# Patient Record
Sex: Female | Born: 1935 | Race: White | Hispanic: No | Marital: Married | Smoking: Former smoker
Health system: Southern US, Community
[De-identification: ages and names within clinical notes are randomized; demographics above are authoritative.]

## PROBLEM LIST (undated history)

## (undated) DIAGNOSIS — I10 Essential (primary) hypertension: Secondary | ICD-10-CM

## (undated) DIAGNOSIS — I771 Stricture of artery: Secondary | ICD-10-CM

## (undated) DIAGNOSIS — G4730 Sleep apnea, unspecified: Secondary | ICD-10-CM

## (undated) DIAGNOSIS — N059 Unspecified nephritic syndrome with unspecified morphologic changes: Secondary | ICD-10-CM

## (undated) DIAGNOSIS — I639 Cerebral infarction, unspecified: Secondary | ICD-10-CM

## (undated) DIAGNOSIS — K219 Gastro-esophageal reflux disease without esophagitis: Secondary | ICD-10-CM

## (undated) DIAGNOSIS — E7800 Pure hypercholesterolemia, unspecified: Secondary | ICD-10-CM

## (undated) DIAGNOSIS — F419 Anxiety disorder, unspecified: Secondary | ICD-10-CM

## (undated) DIAGNOSIS — C801 Malignant (primary) neoplasm, unspecified: Secondary | ICD-10-CM

## (undated) DIAGNOSIS — I701 Atherosclerosis of renal artery: Secondary | ICD-10-CM

## (undated) DIAGNOSIS — I776 Arteritis, unspecified: Secondary | ICD-10-CM

## (undated) DIAGNOSIS — I7782 Antineutrophilic cytoplasmic antibody (ANCA) vasculitis: Secondary | ICD-10-CM

## (undated) HISTORY — DX: Stricture of artery: I77.1

## (undated) HISTORY — DX: Essential (primary) hypertension: I10

## (undated) HISTORY — DX: Pure hypercholesterolemia, unspecified: E78.00

## (undated) HISTORY — DX: Cerebral infarction, unspecified: I63.9

## (undated) HISTORY — PX: MASTECTOMY: SHX3

## (undated) HISTORY — PX: BIOPSY THYROID: PRO38

## (undated) HISTORY — DX: Atherosclerosis of renal artery: I70.1

---

## 1993-09-15 HISTORY — PX: OTHER SURGICAL HISTORY: SHX169

## 2001-03-16 ENCOUNTER — Other Ambulatory Visit: Admission: RE | Admit: 2001-03-16 | Discharge: 2001-03-16 | Payer: Self-pay | Admitting: Family Medicine

## 2001-05-28 ENCOUNTER — Encounter: Admission: RE | Admit: 2001-05-28 | Discharge: 2001-05-28 | Payer: Self-pay | Admitting: Oncology

## 2001-05-28 ENCOUNTER — Encounter (HOSPITAL_COMMUNITY): Admission: RE | Admit: 2001-05-28 | Discharge: 2001-06-27 | Payer: Self-pay | Admitting: Oncology

## 2001-06-29 ENCOUNTER — Ambulatory Visit (HOSPITAL_COMMUNITY): Admission: RE | Admit: 2001-06-29 | Discharge: 2001-06-29 | Payer: Self-pay | Admitting: Family Medicine

## 2001-06-29 ENCOUNTER — Encounter: Payer: Self-pay | Admitting: Family Medicine

## 2002-05-30 ENCOUNTER — Encounter: Admission: RE | Admit: 2002-05-30 | Discharge: 2002-05-30 | Payer: Self-pay | Admitting: Oncology

## 2002-05-30 ENCOUNTER — Encounter (HOSPITAL_COMMUNITY): Admission: RE | Admit: 2002-05-30 | Discharge: 2002-06-29 | Payer: Self-pay | Admitting: Oncology

## 2002-07-04 ENCOUNTER — Encounter: Admission: RE | Admit: 2002-07-04 | Discharge: 2002-07-04 | Payer: Self-pay | Admitting: Oncology

## 2002-07-04 ENCOUNTER — Encounter (HOSPITAL_COMMUNITY): Admission: RE | Admit: 2002-07-04 | Discharge: 2002-08-03 | Payer: Self-pay | Admitting: Oncology

## 2002-07-04 ENCOUNTER — Encounter (HOSPITAL_COMMUNITY): Payer: Self-pay | Admitting: Oncology

## 2003-05-05 ENCOUNTER — Encounter: Payer: Self-pay | Admitting: Cardiovascular Disease

## 2003-06-20 ENCOUNTER — Encounter (HOSPITAL_COMMUNITY): Admission: RE | Admit: 2003-06-20 | Discharge: 2003-07-20 | Payer: Self-pay | Admitting: Oncology

## 2003-06-20 ENCOUNTER — Encounter: Admission: RE | Admit: 2003-06-20 | Discharge: 2003-06-20 | Payer: Self-pay | Admitting: Oncology

## 2003-07-10 ENCOUNTER — Encounter: Payer: Self-pay | Admitting: Family Medicine

## 2003-07-10 ENCOUNTER — Ambulatory Visit (HOSPITAL_COMMUNITY): Admission: RE | Admit: 2003-07-10 | Discharge: 2003-07-10 | Payer: Self-pay | Admitting: Family Medicine

## 2003-07-17 ENCOUNTER — Ambulatory Visit (HOSPITAL_COMMUNITY): Admission: RE | Admit: 2003-07-17 | Discharge: 2003-07-17 | Payer: Self-pay | Admitting: Internal Medicine

## 2004-07-12 ENCOUNTER — Ambulatory Visit (HOSPITAL_COMMUNITY): Admission: RE | Admit: 2004-07-12 | Discharge: 2004-07-12 | Payer: Self-pay | Admitting: Family Medicine

## 2004-11-26 ENCOUNTER — Encounter: Payer: Self-pay | Admitting: Cardiovascular Disease

## 2005-01-30 ENCOUNTER — Ambulatory Visit (HOSPITAL_COMMUNITY): Admission: RE | Admit: 2005-01-30 | Discharge: 2005-01-30 | Payer: Self-pay | Admitting: Family Medicine

## 2005-05-29 ENCOUNTER — Ambulatory Visit (HOSPITAL_COMMUNITY): Admission: RE | Admit: 2005-05-29 | Discharge: 2005-05-29 | Payer: Self-pay | Admitting: Family Medicine

## 2005-07-15 ENCOUNTER — Ambulatory Visit (HOSPITAL_COMMUNITY): Admission: RE | Admit: 2005-07-15 | Discharge: 2005-07-15 | Payer: Self-pay | Admitting: Family Medicine

## 2006-07-22 ENCOUNTER — Ambulatory Visit (HOSPITAL_COMMUNITY): Admission: RE | Admit: 2006-07-22 | Discharge: 2006-07-22 | Payer: Self-pay | Admitting: Family Medicine

## 2006-12-29 ENCOUNTER — Encounter: Payer: Self-pay | Admitting: Cardiovascular Disease

## 2007-07-29 ENCOUNTER — Ambulatory Visit (HOSPITAL_COMMUNITY): Admission: RE | Admit: 2007-07-29 | Discharge: 2007-07-29 | Payer: Self-pay | Admitting: Family Medicine

## 2008-06-20 ENCOUNTER — Encounter: Payer: Self-pay | Admitting: Cardiovascular Disease

## 2008-08-01 ENCOUNTER — Ambulatory Visit (HOSPITAL_COMMUNITY): Admission: RE | Admit: 2008-08-01 | Discharge: 2008-08-01 | Payer: Self-pay | Admitting: Family Medicine

## 2009-05-29 ENCOUNTER — Telehealth (INDEPENDENT_AMBULATORY_CARE_PROVIDER_SITE_OTHER): Payer: Self-pay | Admitting: *Deleted

## 2009-07-12 ENCOUNTER — Encounter (INDEPENDENT_AMBULATORY_CARE_PROVIDER_SITE_OTHER): Payer: Self-pay | Admitting: *Deleted

## 2009-07-12 LAB — CONVERTED CEMR LAB
Calcium: 10.6 mg/dL
Chloride: 104 meq/L
Glucose, Bld: 131 mg/dL
Hgb A1c MFr Bld: 6.8 %
Potassium: 5 meq/L
Sodium: 144 meq/L
Triglycerides: 102 mg/dL

## 2009-08-03 ENCOUNTER — Ambulatory Visit (HOSPITAL_COMMUNITY): Admission: RE | Admit: 2009-08-03 | Discharge: 2009-08-03 | Payer: Self-pay | Admitting: Family Medicine

## 2009-08-19 DIAGNOSIS — E782 Mixed hyperlipidemia: Secondary | ICD-10-CM

## 2009-08-19 DIAGNOSIS — I10 Essential (primary) hypertension: Secondary | ICD-10-CM | POA: Insufficient documentation

## 2009-08-19 DIAGNOSIS — R0989 Other specified symptoms and signs involving the circulatory and respiratory systems: Secondary | ICD-10-CM

## 2009-08-20 ENCOUNTER — Encounter (INDEPENDENT_AMBULATORY_CARE_PROVIDER_SITE_OTHER): Payer: Self-pay | Admitting: *Deleted

## 2009-08-20 ENCOUNTER — Ambulatory Visit: Payer: Self-pay | Admitting: Cardiovascular Disease

## 2009-08-20 ENCOUNTER — Encounter: Payer: Self-pay | Admitting: Cardiology

## 2009-09-10 ENCOUNTER — Ambulatory Visit (HOSPITAL_COMMUNITY): Admission: RE | Admit: 2009-09-10 | Discharge: 2009-09-10 | Payer: Self-pay | Admitting: Cardiovascular Disease

## 2009-09-12 ENCOUNTER — Encounter: Payer: Self-pay | Admitting: Cardiovascular Disease

## 2010-03-04 ENCOUNTER — Ambulatory Visit: Payer: Self-pay | Admitting: Cardiovascular Disease

## 2010-08-05 ENCOUNTER — Ambulatory Visit (HOSPITAL_COMMUNITY): Admission: RE | Admit: 2010-08-05 | Discharge: 2010-08-05 | Payer: Self-pay | Admitting: Family Medicine

## 2010-10-15 NOTE — Assessment & Plan Note (Signed)
Summary: 6 mth f/u per checkout on 08/20/09/tg   Visit Type:  Follow-up Primary Provider:  Dr.Stephen Karie Kirks  CC:  no cardiology complaints.  History of Present Illness: Yuleimi is seen today for F/U of elevated lipids, HTN.  She has previously been seen by Helayne Seminole from [facility].  There was a question of carotid disease but duplex done 12/10 showed no significatn disease.  CRF's include elevated lipid and HTN which is finally Rx.  She also had a HbA1c of 7.3 and has begun Rx for type 2 DM.  She is active in the garden and bowls  She is not having any sscp, palpitations, PND, orthopnea or edema.  She had a normal myouve in 2008 with no documented CAD  Her LDL is 130.  She was supposed to be on 80mg  of Zocor.  She has only been taking a half.  With the new FDA warning on Zocor and her wish to stary on generic we will call he rin 80mg  of Pravachol.    Current Problems (verified): 1)  Mixed Hyperlipidemia  (ICD-272.2) 2)  Carotid Bruit  (ICD-785.9) 3)  Essential Hypertension, Benign  (ICD-401.1)  Current Medications (verified): 1)  Caltrate 600+d 600-400 Mg-Unit Tabs (Calcium Carbonate-Vitamin D) .... Take 1 Tab Two Times A Day 2)  Aspir-Low 81 Mg Tbec (Aspirin) .... Take 1 Tab Daily 3)  Cosamin Ds 500-400 Mg Caps (Glucosamine-Chondroitin) .... Take 1 Tab Daily 4)  Gemfibrozil 600 Mg Tabs (Gemfibrozil) .... Take 1 Tab Bid 5)  Zantac 150 Mg Tabs (Ranitidine Hcl) .... Take Prn 6)  Daily Vitamins  Tabs (Multiple Vitamin) .... Take 1 Tab Daily 7)  Lorazepam 0.5 Mg Tabs (Lorazepam) .... Take 1tab Daily 8)  Metformin Hcl 500 Mg Tabs (Metformin Hcl) .... Take 1 Tab Two Times A Day 9)  Omega-3 Krill Oil 300 Mg Caps (Krill Oil) .... Take 1 Tab Daily 10)  Hydrochlorothiazide 12.5 Mg Caps (Hydrochlorothiazide) .... Take 1 Tab Daily 11)  Pravachol 80 Mg Tabs (Pravastatin Sodium) .... Take 1 Tablet By Mouth At Bedtime  Allergies (verified): 1)  ! Codeine  Past History:  Past Medical  History: Last updated: 08/19/2009 Hypercholesterolemia RAS Subclavian stenosis HTN  Past Surgical History: Last updated: 08/19/2009 left breast mastectomy with chemo in 1995  Family History: Last updated: 08/20/2009 positive for premature CAD on fathers side  Social History: Last updated: 08/19/2009 Married Husband Dewey patient with CABG 2010 Active Non-smoker Non-drinker  Review of Systems       Denies fever, malais, weight loss, blurry vision, decreased visual acuity, cough, sputum, SOB, hemoptysis, pleuritic pain, palpitaitons, heartburn, abdominal pain, melena, lower extremity edema, claudication, or rash.   Vital Signs:  Patient profile:   76 year old female Weight:      151 pounds Pulse rate:   95 / minute BP sitting:   137 / 68  (right arm)  Vitals Entered By: Doretha Sou, CNA (March 04, 2010 3:27 PM)  Physical Exam  General:  Affect appropriate Healthy:  appears stated age 75: normal Neck supple with no adenopathy JVP normal no bruits no thyromegaly Lungs clear with no wheezing and good diaphragmatic motion Heart:  S1/S2 no murmur,rub, gallop or click PMI normal Abdomen: benighn, BS positve, no tenderness, no AAA no bruit.  No HSM or HJR Distal pulses intact with no bruits No edema Neuro non-focal Skin warm and dry    Impression & Recommendations:  Problem # 1:  MIXED HYPERLIPIDEMIA (ICD-272.2) Change to Pravochol F/U lft's next month  with Karie Kirks The following medications were removed from the medication list:    Simvastatin 80 Mg Tabs (Simvastatin) .Marland Kitchen... Take 1 tab daily Her updated medication list for this problem includes:    Gemfibrozil 600 Mg Tabs (Gemfibrozil) .Marland Kitchen... Take 1 tab bid    Pravachol 80 Mg Tabs (Pravastatin sodium) .Marland Kitchen... Take 1 tablet by mouth at bedtime  Problem # 2:  CAROTID BRUIT (ICD-785.9) F/U duplex in a year.  No change in bruits and duplex 2010 no significant disease  Problem # 3:  ESSENTIAL HYPERTENSION,  BENIGN (ICD-401.1)  Well contolled  Shoud be on ACE with DM.  Will leave this up to Dr Karie Kirks Her updated medication list for this problem includes:    Aspir-low 81 Mg Tbec (Aspirin) .Marland Kitchen... Take 1 tab daily    Hydrochlorothiazide 12.5 Mg Caps (Hydrochlorothiazide) .Marland Kitchen... Take 1 tab daily  Her updated medication list for this problem includes:    Aspir-low 81 Mg Tbec (Aspirin) .Marland Kitchen... Take 1 tab daily    Hydrochlorothiazide 12.5 Mg Caps (Hydrochlorothiazide) .Marland Kitchen... Take 1 tab daily  Patient Instructions: 1)  Your physician recommends that you schedule a follow-up appointment in: 1 year 2)  Your physician has recommended you make the following change in your medication: Stop taking Simvastatin and start taking Pravachol 80mg  by mouth at bedtime  Prescriptions: PRAVACHOL 80 MG TABS (PRAVASTATIN SODIUM) take 1 tablet by mouth at bedtime  #30 x 11   Entered by:   Jeani Hawking Via LPN   Authorized by:   Bosie Clos, MD, [facility]   Signed by:   Jeani Hawking Via LPN on [id number]   Method used:   Electronically to        [facility]  [address] (retail)       [place] [address]       [place], [place]  [address]       Ph: [phone]       Fax: [phone]   RxID:   [id number]

## 2011-01-31 NOTE — Op Note (Signed)
   Colleen Parks, DIPIETRANTONIO                        ACCOUNT NO.:  [id number]   MEDICAL RECORD NO.:  [id number]                   PATIENT TYPE:  AMB   LOCATION:  DAY                                  FACILITY:  [facility]   PHYSICIAN:  Hildred Laser, M.D.                 DATE OF BIRTH:  18-Jun-1935   DATE OF PROCEDURE:  07/17/2003  DATE OF DISCHARGE:                                 OPERATIVE REPORT   PROCEDURE:  Total colonoscopy.   INDICATIONS:  Colleen Parks is a 76 year old Caucasian female who is undergoing  screening colonoscopy.  The family history is negative for colorectal  carcinoma, personal history is significant for breast carcinoma and she  remains in remission.  The procedure and risks were reviewed with the  patient and informed consent was obtained.   PREOPERATIVE MEDICATIONS:  Demerol 50 mg IV, Versed 5 mg IV.   FINDINGS:  The procedure performed in endoscopy suite.  The patient's vital  signs and O2 saturations were monitored during the procedure and remained  stable.  The patient was placed in the left lateral decubitus position and  rectal examination performed.  No abnormality was noted on external or  digital exam.  The Olympus video scope was placed in the rectum and advanced  under direct vision into the sigmoid colon and beyond.  The preparation was  excellent.  A single small diverticulum was noted in the sigmoid colon.  The  scope was passed into the cecum which was identified by the appendiceal  orifice and ileocecal valve.  Pictures were taken for the record.  As the  scope was withdrawn, the colonic mucosa was once again carefully examined  and was normal throughout.  The rectal mucosa was normal.  The scope was  retroflexed to exam the anorectal junction and a small anal papilla was  noted.  The endoscope was straightened and withdrawn.  The patient tolerated  the procedure well.   FINAL DIAGNOSES:  Single small diverticulum at sigmoid colon, otherwise  normal  colonoscopy.   RECOMMENDATIONS:  1. High fiber diet.  2. She should continue yearly Hemoccults and consider next exam in 10 years     from now.      ___________________________________________                                            Hildred Laser, M.D.   NR/MEDQ  D:  07/17/2003  T:  07/17/2003  Job:  [id number]

## 2011-02-27 ENCOUNTER — Encounter: Payer: Self-pay | Admitting: Cardiovascular Disease

## 2011-02-28 ENCOUNTER — Ambulatory Visit (INDEPENDENT_AMBULATORY_CARE_PROVIDER_SITE_OTHER): Payer: Medicare Other | Admitting: Cardiovascular Disease

## 2011-02-28 ENCOUNTER — Encounter: Payer: Self-pay | Admitting: Cardiovascular Disease

## 2011-02-28 DIAGNOSIS — R0989 Other specified symptoms and signs involving the circulatory and respiratory systems: Secondary | ICD-10-CM

## 2011-02-28 DIAGNOSIS — E782 Mixed hyperlipidemia: Secondary | ICD-10-CM

## 2011-02-28 DIAGNOSIS — I10 Essential (primary) hypertension: Secondary | ICD-10-CM

## 2011-02-28 NOTE — Assessment & Plan Note (Signed)
ASA  F/U duplex in a year

## 2011-02-28 NOTE — Patient Instructions (Signed)
Your physician wants you to follow-up in: ONE YEAR You will receive a reminder letter in the mail two months in advance. If you don't receive a letter, please call our office to schedule the follow-up appointment.  

## 2011-02-28 NOTE — Assessment & Plan Note (Signed)
On crestor now but too expensive.  45$  Will F/U with Dr Karie Kirks to change  Would continue some statin

## 2011-02-28 NOTE — Progress Notes (Signed)
Colleen Parks is seen today for F/U of elevated lipids, HTN. She has previously been seen by Helayne Seminole from [facility]. There was a question of carotid disease but duplex done 12/10 showed no significatn disease. CRF's include elevated lipid and HTN which is finally Rx. She also had a HbA1c of 7.3 and has begun Rx for type 2 DM. She is active in the garden and bowls She is not having any sscp, palpitations, PND, orthopnea or edema. She had a normal myouve in 2008 with no documented CAD  Looking forward to going to [place] to bowl.  Some muscular pain in back of right neck  Was on zocor 80 mg and we changed to pravachol 80  She subsequently was changed to crestor but this is too expensive for her   ROS: Denies fever, malais, weight loss, blurry vision, decreased visual acuity, cough, sputum, SOB, hemoptysis, pleuritic pain, palpitaitons, heartburn, abdominal pain, melena, lower extremity edema, claudication, or rash.  All other systems reviewed and negative  General: Affect appropriate Healthy:  appears stated age 76: normal Neck supple with no adenopathy JVP normal short high pitched right  bruits no thyromegaly Lungs clear with no wheezing and good diaphragmatic motion Heart:  S1/S2 no murmur,rub, gallop or click PMI normal Abdomen: benighn, BS positve, no tenderness, no AAA no bruit.  No HSM or HJR Distal pulses intact with no bruits No edema Neuro non-focal Skin warm and dry No muscular weakness   Current Outpatient Prescriptions  Medication Sig Dispense Refill  . aspirin 81 MG EC tablet Take 81 mg by mouth daily.        . Calcium Carbonate-Vitamin D (CALTRATE 600+D) 600-400 MG-UNIT per tablet Take 1 tablet by mouth 2 (two) times daily.        Marland Kitchen gemfibrozil (LOPID) 600 MG tablet Take 600 mg by mouth 2 (two) times daily.        . Glucosamine-Chondroitin (COSAMIN DS) 500-400 MG CAPS Take 1 capsule by mouth daily.        . hydrochlorothiazide 25 MG tablet 1/2 tab po qd       . LORazepam  (ATIVAN) 0.5 MG tablet Take 0.5 mg by mouth daily.        . metFORMIN (GLUCOPHAGE) 500 MG tablet Take 500 mg by mouth 2 (two) times daily.        . OMEGA-3 KRILL OIL 300 MG CAPS Take 1 capsule by mouth daily.        . ranitidine (ZANTAC) 150 MG tablet Take 150 mg by mouth as needed.        . rosuvastatin (CRESTOR) 20 MG tablet Take 20 mg by mouth daily.        Marland Kitchen DISCONTD: hydrochlorothiazide (,MICROZIDE/HYDRODIURIL,) 12.5 MG capsule Take 12.5 mg by mouth daily.        Marland Kitchen DISCONTD: Multiple Vitamin (DAILY VITAMIN PO) Take 1 tablet by mouth daily.        Marland Kitchen DISCONTD: pravastatin (PRAVACHOL) 80 MG tablet Take 80 mg by mouth at bedtime.          Allergies  Codeine  Electrocardiogram:  NSR 63 normal ECG  Assessment and Plan

## 2011-02-28 NOTE — Assessment & Plan Note (Signed)
Well controlled.  Continue current medications and low sodium Dash type diet.    

## 2011-03-03 ENCOUNTER — Telehealth: Payer: Self-pay | Admitting: Cardiovascular Disease

## 2011-03-03 NOTE — Telephone Encounter (Signed)
Samples of crestor.  Can you mail to patient.

## 2011-03-04 NOTE — Telephone Encounter (Signed)
Spoke with pt husband, samples of crestor sent to the [place] office for pt to pick up Fredia Beets

## 2011-07-01 ENCOUNTER — Other Ambulatory Visit (HOSPITAL_COMMUNITY): Payer: Self-pay | Admitting: Family Medicine

## 2011-07-01 DIAGNOSIS — Z139 Encounter for screening, unspecified: Secondary | ICD-10-CM

## 2011-08-11 ENCOUNTER — Ambulatory Visit (HOSPITAL_COMMUNITY)
Admission: RE | Admit: 2011-08-11 | Discharge: 2011-08-11 | Disposition: A | Discharge status: Home / Self Care | Payer: Medicare Other | Source: Ambulatory Visit | Attending: Family Medicine | Admitting: Family Medicine

## 2011-08-11 DIAGNOSIS — Z1231 Encounter for screening mammogram for malignant neoplasm of breast: Secondary | ICD-10-CM | POA: Insufficient documentation

## 2011-08-11 DIAGNOSIS — Z139 Encounter for screening, unspecified: Secondary | ICD-10-CM

## 2011-11-19 ENCOUNTER — Encounter: Payer: Self-pay | Admitting: Cardiovascular Disease

## 2011-11-19 ENCOUNTER — Ambulatory Visit (INDEPENDENT_AMBULATORY_CARE_PROVIDER_SITE_OTHER): Payer: Medicare Other | Admitting: Cardiovascular Disease

## 2011-11-19 VITALS — BP 152/71 | HR 78 | Ht 66.0 in | Wt 144.0 lb

## 2011-11-19 DIAGNOSIS — E119 Type 2 diabetes mellitus without complications: Secondary | ICD-10-CM | POA: Insufficient documentation

## 2011-11-19 DIAGNOSIS — R0989 Other specified symptoms and signs involving the circulatory and respiratory systems: Secondary | ICD-10-CM

## 2011-11-19 NOTE — Assessment & Plan Note (Signed)
Well controlled.  Continue current medications and low sodium Dash type diet.    

## 2011-11-19 NOTE — Assessment & Plan Note (Signed)
11/12  A1c was 6.7  Started on glucophage.  F/U Dr Karie Kirks

## 2011-11-19 NOTE — Progress Notes (Signed)
Patient ID: Colleen Parks, female   DOB: [birth year], 77 y.o.   MRN: [id number] Eline is seen today for F/U of elevated lipids, HTN. She has previously been seen by Helayne Seminole from [facility]. There was a question of carotid disease but duplex done 12/10 showed no significatn disease. CRF's include elevated lipid and HTN which is finally Rx. She also had a HbA1c of 7.3 and has begun Rx for type 2 DM. She is active in the garden and bowls She is not having any sscp, palpitations, PND, orthopnea or edema. She had a normal myouve in 2008 with no documented CAD Looking forward to going to [place] to bowl. Some muscular pain in back of right neck Was on zocor 80 mg and we changed to pravachol 80 She subsequently was changed to crestor but this is too expensive for her  Chol meds changed by Dr Karie Kirks.  Just back from [place]gambling trip to [place]  ROS: Denies fever, malais, weight loss, blurry vision, decreased visual acuity, cough, sputum, SOB, hemoptysis, pleuritic pain, palpitaitons, heartburn, abdominal pain, melena, lower extremity edema, claudication, or rash.  All other systems reviewed and negative  General: Affect appropriate Healthy:  appears stated age 76: normal Neck supple with no adenopathy JVP normal right  bruits no thyromegaly Lungs clear with no wheezing and good diaphragmatic motion Heart:  S1/S2 no murmur, no rub, gallop or click PMI normal Abdomen: benighn, BS positve, no tenderness, no AAA no bruit.  No HSM or HJR Distal pulses intact with no bruits No edema Neuro non-focal Skin warm and dry No muscular weakness   Current Outpatient Prescriptions  Medication Sig Dispense Refill  . aspirin 81 MG EC tablet Take 81 mg by mouth daily.        . Calcium Carbonate-Vitamin D (CALTRATE 600+D) 600-400 MG-UNIT per tablet Take 1 tablet by mouth 2 (two) times daily.        Marland Kitchen gemfibrozil (LOPID) 600 MG tablet Take 600 mg by mouth 2 (two) times daily.        .  Glucosamine-Chondroitin (COSAMIN DS) 500-400 MG CAPS Take 1 capsule by mouth daily.        Marland Kitchen lisinopril (PRINIVIL,ZESTRIL) 5 MG tablet       . LORazepam (ATIVAN) 0.5 MG tablet Take 0.5 mg by mouth daily.        Marland Kitchen lovastatin (MEVACOR) 20 MG tablet       . metFORMIN (GLUCOPHAGE) 500 MG tablet Take 500 mg by mouth 2 (two) times daily.        . OMEGA-3 KRILL OIL 300 MG CAPS Take 1 capsule by mouth daily.        . ranitidine (ZANTAC) 150 MG tablet Take 150 mg by mouth as needed.          Allergies  Codeine  Electrocardiogram:  Assessment and Plan

## 2011-11-19 NOTE — Assessment & Plan Note (Signed)
LDL at [place] was 154  Now on two drugs.  Discussed low carb diet.  F/U primary

## 2011-11-19 NOTE — Assessment & Plan Note (Signed)
F/U carotid duplex previous 12/10 without significant disease.  ASA

## 2011-11-19 NOTE — Patient Instructions (Signed)
**Note De-Identified  Obfuscation** Your physician has requested that you have a carotid duplex. This test is an ultrasound of the carotid arteries in your neck. It looks at blood flow through these arteries that supply the brain with blood. Allow one hour for this exam. There are no restrictions or special instructions.  Your physician recommends that you schedule a follow-up appointment in: 6 months

## 2011-12-09 ENCOUNTER — Encounter (HOSPITAL_COMMUNITY)
Admission: RE | Admit: 2011-12-09 | Discharge: 2011-12-09 | Disposition: A | Discharge status: Home / Self Care | Payer: Medicare Other | Source: Ambulatory Visit | Attending: Ophthalmology | Admitting: Ophthalmology

## 2011-12-09 ENCOUNTER — Encounter (HOSPITAL_COMMUNITY): Payer: Self-pay

## 2011-12-09 HISTORY — DX: Malignant (primary) neoplasm, unspecified: C80.1

## 2011-12-09 HISTORY — DX: Gastro-esophageal reflux disease without esophagitis: K21.9

## 2011-12-09 HISTORY — DX: Anxiety disorder, unspecified: F41.9

## 2011-12-09 LAB — BASIC METABOLIC PANEL
Potassium: 4.5 mEq/L (ref 3.5–5.1)
Sodium: 143 mEq/L (ref 135–145)

## 2011-12-09 LAB — HEMOGLOBIN AND HEMATOCRIT, BLOOD: HCT: 34.9 % — ABNORMAL LOW (ref 36.0–46.0)

## 2011-12-09 NOTE — Patient Instructions (Signed)
Victor  12/09/2011   Your procedure is scheduled on:  Thursday, 12/18/11   Report to [facility] at 0800 AM.  Call this number if you have problems the morning of surgery: [phone]   Remember:   Do not eat food:After Midnight.  May have clear liquids:until Midnight .  Clear liquids include soda, tea, black coffee, apple or grape juice, broth.  Take these medicines the morning of surgery with A SIP OF WATER: zantac, lisinopril   Do not wear jewelry, make-up or nail polish.  Do not wear lotions, powders, or perfumes. You may wear deodorant.  Do not shave 48 hours prior to surgery.  Do not bring valuables to the hospital.  Contacts, dentures or bridgework may not be worn into surgery.  Leave suitcase in the car. After surgery it may be brought to your room.  For patients admitted to the hospital, checkout time is 11:00 AM the day of discharge.   Patients discharged the day of surgery will not be allowed to drive home.  Name and phone number of your driver: driver  Special Instructions: Use eye drops as instructed.   Please read over the following fact sheets that you were given: Pain Booklet, Anesthesia Post-op Instructions and Care and Recovery After Surgery   PATIENT INSTRUCTIONS POST-ANESTHESIA  IMMEDIATELY FOLLOWING SURGERY:  Do not drive or operate machinery for the first twenty four hours after surgery.  Do not make any important decisions for twenty four hours after surgery or while taking narcotic pain medications or sedatives.  If you develop intractable nausea and vomiting or a severe headache please notify your doctor immediately.  FOLLOW-UP:  Please make an appointment with your surgeon as instructed. You do not need to follow up with anesthesia unless specifically instructed to do so.  WOUND CARE INSTRUCTIONS (if applicable):  Keep a dry clean dressing on the anesthesia/puncture wound site if there is drainage.  Once the wound has quit draining you may leave it  open to air.  Generally you should leave the bandage intact for twenty four hours unless there is drainage.  If the epidural site drains for more than 36-48 hours please call the anesthesia department.  QUESTIONS?:  Please feel free to call your physician or the hospital operator if you have any questions, and they will be happy to assist you.          Cataract A cataract is a clouding of the lens of the eye. When a lens becomes cloudy, vision is reduced based on the degree and nature of the clouding. Many cataracts reduce vision to some degree. Some cataracts make people more near-sighted as they develop. Other cataracts increase glare. Cataracts that are ignored and become worse can sometimes look white. The white color can be seen through the pupil. CAUSES   Aging. However, cataracts may occur at any age, even in newborns.   Certain drugs.   Trauma to the eye.   Certain diseases such as diabetes.   Specific eye diseases such as chronic inflammation inside the eye or a sudden attack of a rare form of glaucoma.   Inherited or acquired medical problems.  SYMPTOMS   Gradual, progressive drop in vision in the affected eye.   Severe, rapid visual loss. This most often happens when trauma is the cause.  DIAGNOSIS  To detect a cataract, an eye doctor examines the lens. Cataracts are best diagnosed with an exam of the eyes with the pupils enlarged (dilated) by drops.  TREATMENT  For an early cataract, vision may improve by using different eyeglasses or stronger lighting. If that does not help your vision, surgery is the only effective treatment. A cataract needs to be surgically removed when vision loss interferes with your everyday activities, such as driving, reading, or watching TV. A cataract may also have to be removed if it prevents examination or treatment of another eye problem. Surgery removes the cloudy lens and usually replaces it with a substitute lens (intraocular lens, IOL).    At a time when both you and your doctor agree, the cataract will be surgically removed. If you have cataracts in both eyes, only one is usually removed at a time. This allows the operated eye to heal and be out of danger from any possible problems after surgery (such as infection or poor wound healing). In rare cases, a cataract may be doing damage to your eye. In these cases, your caregiver may advise surgical removal right away. The vast majority of people who have cataract surgery have better vision afterward. HOME CARE INSTRUCTIONS  If you are not planning surgery, you may be asked to do the following:  Use different eyeglasses.   Use stronger or brighter lighting.   Ask your eye doctor about reducing your medicine dose or changing medicines if it is thought that a medicine caused your cataract. Changing medicines does not make the cataract go away on its own.   Become familiar with your surroundings. Poor vision can lead to injury. Avoid bumping into things on the affected side. You are at a higher risk for tripping or falling.   Exercise extreme care when driving or operating machinery.   Wear sunglasses if you are sensitive to bright light or experiencing problems with glare.  SEEK IMMEDIATE MEDICAL CARE IF:   You have a worsening or sudden vision loss.   You notice redness, swelling, or increasing pain in the eye.   You have a fever.  Document Released: 09/01/2005 Document Revised: 08/21/2011 Document Reviewed: 04/25/2011 [facility] Patient Information 2012 [place].

## 2011-12-17 MED ORDER — LIDOCAINE HCL (PF) 1 % IJ SOLN
INTRAMUSCULAR | Status: AC
  Filled 2011-12-17: qty 2

## 2011-12-17 MED ORDER — LIDOCAINE HCL 3.5 % OP GEL
OPHTHALMIC | Status: AC
  Administered 2011-12-18: 1 via OPHTHALMIC
  Filled 2011-12-17: qty 5

## 2011-12-17 MED ORDER — CYCLOPENTOLATE-PHENYLEPHRINE 0.2-1 % OP SOLN
OPHTHALMIC | Status: AC
  Administered 2011-12-18: 1 [drp] via OPHTHALMIC
  Filled 2011-12-17: qty 2

## 2011-12-17 MED ORDER — NEOMYCIN-POLYMYXIN-DEXAMETH 3.5-10000-0.1 OP OINT
TOPICAL_OINTMENT | OPHTHALMIC | Status: AC
  Filled 2011-12-17: qty 3.5

## 2011-12-17 MED ORDER — TETRACAINE HCL 0.5 % OP SOLN
OPHTHALMIC | Status: AC
  Administered 2011-12-18: 1 [drp] via OPHTHALMIC
  Filled 2011-12-17: qty 2

## 2011-12-18 ENCOUNTER — Encounter (HOSPITAL_COMMUNITY)
Admission: RE | Disposition: A | Discharge status: Home / Self Care | Payer: Self-pay | Source: Ambulatory Visit | Attending: Ophthalmology

## 2011-12-18 ENCOUNTER — Encounter (HOSPITAL_COMMUNITY): Payer: Self-pay | Admitting: *Deleted

## 2011-12-18 ENCOUNTER — Encounter (HOSPITAL_COMMUNITY): Payer: Self-pay | Admitting: Anesthesiology

## 2011-12-18 ENCOUNTER — Ambulatory Visit (HOSPITAL_COMMUNITY)
Admission: RE | Admit: 2011-12-18 | Discharge: 2011-12-18 | Disposition: A | Discharge status: Home / Self Care | Payer: Medicare Other | Source: Ambulatory Visit | Attending: Ophthalmology | Admitting: Ophthalmology

## 2011-12-18 ENCOUNTER — Ambulatory Visit (HOSPITAL_COMMUNITY): Payer: Medicare Other | Admitting: Anesthesiology

## 2011-12-18 DIAGNOSIS — I10 Essential (primary) hypertension: Secondary | ICD-10-CM | POA: Insufficient documentation

## 2011-12-18 DIAGNOSIS — Z79899 Other long term (current) drug therapy: Secondary | ICD-10-CM | POA: Insufficient documentation

## 2011-12-18 DIAGNOSIS — Z951 Presence of aortocoronary bypass graft: Secondary | ICD-10-CM | POA: Insufficient documentation

## 2011-12-18 DIAGNOSIS — E119 Type 2 diabetes mellitus without complications: Secondary | ICD-10-CM | POA: Insufficient documentation

## 2011-12-18 DIAGNOSIS — Z01812 Encounter for preprocedural laboratory examination: Secondary | ICD-10-CM | POA: Insufficient documentation

## 2011-12-18 DIAGNOSIS — H2589 Other age-related cataract: Secondary | ICD-10-CM | POA: Insufficient documentation

## 2011-12-18 HISTORY — PX: CATARACT EXTRACTION W/PHACO: SHX586

## 2011-12-18 LAB — GLUCOSE, CAPILLARY: Glucose-Capillary: 104 mg/dL — ABNORMAL HIGH (ref 70–99)

## 2011-12-18 SURGERY — PHACOEMULSIFICATION, CATARACT, WITH IOL INSERTION
Anesthesia: Monitor Anesthesia Care | Site: Eye | Laterality: Right | Wound class: Clean

## 2011-12-18 MED ORDER — LIDOCAINE 3.5 % OP GEL OPTIME - NO CHARGE
OPHTHALMIC | Status: DC | PRN
  Administered 2011-12-18: 1 [drp] via OPHTHALMIC

## 2011-12-18 MED ORDER — PHENYLEPHRINE HCL 2.5 % OP SOLN
1.0000 [drp] | OPHTHALMIC | Status: AC
  Administered 2011-12-18 (×3): 1 [drp] via OPHTHALMIC

## 2011-12-18 MED ORDER — LACTATED RINGERS IV SOLN
INTRAVENOUS | Status: DC
  Administered 2011-12-18: 1000 mL via INTRAVENOUS

## 2011-12-18 MED ORDER — POVIDONE-IODINE 5 % OP SOLN
OPHTHALMIC | Status: DC | PRN
  Administered 2011-12-18: 1 via OPHTHALMIC

## 2011-12-18 MED ORDER — LIDOCAINE HCL 3.5 % OP GEL
1.0000 "application " | Freq: Once | OPHTHALMIC | Status: AC
  Administered 2011-12-18: 1 via OPHTHALMIC

## 2011-12-18 MED ORDER — FENTANYL CITRATE 0.05 MG/ML IJ SOLN: 25.0000 ug | INTRAMUSCULAR | Status: DC | PRN

## 2011-12-18 MED ORDER — EPINEPHRINE HCL 1 MG/ML IJ SOLN
INTRAMUSCULAR | Status: AC
  Filled 2011-12-18: qty 1

## 2011-12-18 MED ORDER — PROVISC 10 MG/ML IO SOLN
INTRAOCULAR | Status: DC | PRN
  Administered 2011-12-18: 8.5 mg via INTRAOCULAR

## 2011-12-18 MED ORDER — BSS IO SOLN
INTRAOCULAR | Status: DC | PRN
  Administered 2011-12-18: 15 mL via INTRAOCULAR

## 2011-12-18 MED ORDER — NEOMYCIN-POLYMYXIN-DEXAMETH 0.1 % OP OINT
TOPICAL_OINTMENT | OPHTHALMIC | Status: DC | PRN
  Administered 2011-12-18: 1 via OPHTHALMIC

## 2011-12-18 MED ORDER — MIDAZOLAM HCL 2 MG/2ML IJ SOLN
INTRAMUSCULAR | Status: AC
  Administered 2011-12-18: 2 mg via INTRAVENOUS
  Filled 2011-12-18: qty 2

## 2011-12-18 MED ORDER — PHENYLEPHRINE HCL 2.5 % OP SOLN
OPHTHALMIC | Status: AC
  Administered 2011-12-18: 1 [drp] via OPHTHALMIC
  Filled 2011-12-18: qty 2

## 2011-12-18 MED ORDER — TETRACAINE HCL 0.5 % OP SOLN
1.0000 [drp] | OPHTHALMIC | Status: AC
  Administered 2011-12-18 (×3): 1 [drp] via OPHTHALMIC

## 2011-12-18 MED ORDER — EPINEPHRINE HCL 1 MG/ML IJ SOLN
INTRAOCULAR | Status: DC | PRN
  Administered 2011-12-18: 10:00:00

## 2011-12-18 MED ORDER — CYCLOPENTOLATE-PHENYLEPHRINE 0.2-1 % OP SOLN
1.0000 [drp] | OPHTHALMIC | Status: AC
  Administered 2011-12-18 (×3): 1 [drp] via OPHTHALMIC

## 2011-12-18 MED ORDER — MIDAZOLAM HCL 2 MG/2ML IJ SOLN
1.0000 mg | INTRAMUSCULAR | Status: DC | PRN
  Administered 2011-12-18: 2 mg via INTRAVENOUS

## 2011-12-18 MED ORDER — ONDANSETRON HCL 4 MG/2ML IJ SOLN: 4.0000 mg | Freq: Once | INTRAMUSCULAR | Status: DC | PRN

## 2011-12-18 MED ORDER — LIDOCAINE HCL (PF) 1 % IJ SOLN
INTRAOCULAR | Status: DC | PRN
  Administered 2011-12-18: 10:00:00 via OPHTHALMIC

## 2011-12-18 SURGICAL SUPPLY — 32 items
CAPSULAR TENSION RING-AMO (OPHTHALMIC RELATED) IMPLANT
CLOTH BEACON ORANGE TIMEOUT ST (SAFETY) ×1 IMPLANT
EYE SHIELD UNIVERSAL CLEAR (GAUZE/BANDAGES/DRESSINGS) ×2 IMPLANT
GLOVE BIO SURGEON STRL SZ 6.5 (GLOVE) IMPLANT
GLOVE BIOGEL PI IND STRL 6.5 (GLOVE) IMPLANT
GLOVE BIOGEL PI IND STRL 7.0 (GLOVE) IMPLANT
GLOVE BIOGEL PI IND STRL 7.5 (GLOVE) IMPLANT
GLOVE BIOGEL PI INDICATOR 6.5 (GLOVE)
GLOVE BIOGEL PI INDICATOR 7.0 (GLOVE)
GLOVE BIOGEL PI INDICATOR 7.5 (GLOVE)
GLOVE ECLIPSE 6.5 STRL STRAW (GLOVE) ×1 IMPLANT
GLOVE ECLIPSE 7.0 STRL STRAW (GLOVE) IMPLANT
GLOVE ECLIPSE 7.5 STRL STRAW (GLOVE) IMPLANT
GLOVE EXAM NITRILE LRG STRL (GLOVE) IMPLANT
GLOVE EXAM NITRILE MD LF STRL (GLOVE) ×1 IMPLANT
GLOVE SKINSENSE NS SZ6.5 (GLOVE)
GLOVE SKINSENSE NS SZ7.0 (GLOVE)
GLOVE SKINSENSE STRL SZ6.5 (GLOVE) IMPLANT
GLOVE SKINSENSE STRL SZ7.0 (GLOVE) IMPLANT
KIT VITRECTOMY (OPHTHALMIC RELATED) IMPLANT
PAD ARMBOARD 7.5X6 YLW CONV (MISCELLANEOUS) ×1 IMPLANT
PROC W NO LENS (INTRAOCULAR LENS)
PROC W SPEC LENS (INTRAOCULAR LENS)
PROCESS W NO LENS (INTRAOCULAR LENS) IMPLANT
PROCESS W SPEC LENS (INTRAOCULAR LENS) IMPLANT
RING MALYGIN (MISCELLANEOUS) IMPLANT
SIGHTPATH CAT PROC W REG LENS (Ophthalmic Related) ×2 IMPLANT
SYR TB 1ML LL NO SAFETY (SYRINGE) ×1 IMPLANT
TAPE SURG TRANSPORE 1 IN (GAUZE/BANDAGES/DRESSINGS) IMPLANT
TAPE SURGICAL TRANSPORE 1 IN (GAUZE/BANDAGES/DRESSINGS) ×1
VISCOELASTIC ADDITIONAL (OPHTHALMIC RELATED) IMPLANT
WATER STERILE IRR 250ML POUR (IV SOLUTION) ×1 IMPLANT

## 2011-12-18 NOTE — Anesthesia Postprocedure Evaluation (Signed)
  Anesthesia Post-op Note  Patient: Colleen Parks  Procedure(s) Performed: Procedure(s) (LRB): CATARACT EXTRACTION PHACO AND INTRAOCULAR LENS PLACEMENT (IOC) (Right)  Patient Location: PACU and Short Stay  Anesthesia Type: MAC  Level of Consciousness: awake, alert  and oriented  Airway and Oxygen Therapy: Patient Spontanous Breathing  Post-op Pain: none  Post-op Assessment: Post-op Vital signs reviewed, Patient's Cardiovascular Status Stable, Respiratory Function Stable, Patent Airway and No signs of Nausea or vomiting  Post-op Vital Signs: Reviewed and stable  Complications: No apparent anesthesia complications

## 2011-12-18 NOTE — Op Note (Signed)
NAMEMERCEDEZ, ROUNDTREE             ACCOUNT NO.:  [id number]  MEDICAL RECORD NO.:  [id number]  LOCATION:  [facility]                          FACILITY:  [facility]  PHYSICIAN:  Richardo Hanks, MD       DATE OF BIRTH:  Jul 16, 1935  DATE OF PROCEDURE:  12/18/2011 DATE OF DISCHARGE:  12/18/2011                              OPERATIVE REPORT   PREOPERATIVE DIAGNOSIS:  Combined cataract, right eye, diagnosis code 366.19.  POSTOPERATIVE DIAGNOSIS:  Combined cataract, right eye, diagnosis code 366.19.  OPERATION PERFORMED:  Phacoemulsification with posterior chamber intraocular lens implantation, right eye.  SURGEON:  Franky Macho. Joncarlos Atkison, MD  ANESTHESIA:  General endotracheal anesthesia.  OPERATIVE SUMMARY:  In the preoperative area, dilating drops were placed into the right eye.  The patient was then brought into the operating room where she was placed under general anesthesia.  The eye was then prepped and draped.  Beginning with a 75 blade, a paracentesis port was made at the surgeon's 2 o'clock position.  The anterior chamber was then filled with a 1% nonpreserved lidocaine solution with epinephrine.  This was followed by Viscoat to deepen the chamber.  A small fornix-based peritomy was performed superiorly.  Next, a single iris hook was placed through the limbus superiorly.  A 2.4-mm keratome blade was then used to make a clear corneal incision over the iris hook.  A bent cystotome needle and Utrata forceps were used to create a continuous tear capsulotomy.  Hydrodissection was performed using balanced salt solution on a fine cannula.  The lens nucleus was then removed using phacoemulsification in a quadrant cracking technique.  The cortical material was then removed with irrigation and aspiration.  The capsular bag and anterior chamber were refilled with Provisc.  The wound was widened to approximately 3 mm and a posterior chamber intraocular lens was placed into the capsular bag without difficulty  using an [employer] lens injecting system.  A single 10-0 nylon suture was then used to close the incision as well as stromal hydration.  The Provisc was removed from the anterior chamber and capsular bag with irrigation and aspiration.  At this point, the wounds were tested for leak, which were negative.  The anterior chamber remained deep and stable.  The patient tolerated the procedure well.  There were no operative complications, and she awoke from general anesthesia without problem.  No surgical specimens.  Prosthetic device used is a Doctor, general practice lens, model MX60, power of 22.0, serial number [id number].          ______________________________ Richardo Hanks, MD     KEH/MEDQ  D:  12/18/2011  T:  12/18/2011  Job:  [id number]

## 2011-12-18 NOTE — H&P (Signed)
I have reviewed the H&P, the patient was re-examined, and I have identified no interval changes in medical condition and plan of care since the history and physical of record  

## 2011-12-18 NOTE — Anesthesia Preprocedure Evaluation (Signed)
Anesthesia Evaluation  Patient identified by MRN, date of birth, ID band Patient awake    Reviewed: Allergy & Precautions, H&P , NPO status , Patient's Chart, lab work & pertinent test results  Airway Mallampati: II      Dental  (+) Teeth Intact   Pulmonary  breath sounds clear to auscultation        Cardiovascular hypertension, Pt. on medications + Peripheral Vascular Disease CABG: subclavian and renal artery stenosis. Rhythm:Regular     Neuro/Psych    GI/Hepatic GERD-  Medicated and Controlled,  Endo/Other  Diabetes mellitus-, Well Controlled, Type 2  Renal/GU      Musculoskeletal   Abdominal   Peds  Hematology   Anesthesia Other Findings   Reproductive/Obstetrics                           Anesthesia Physical Anesthesia Plan  ASA: III  Anesthesia Plan: MAC   Post-op Pain Management:    Induction: Intravenous  Airway Management Planned: Nasal Cannula  Additional Equipment:   Intra-op Plan:   Post-operative Plan:   Informed Consent: I have reviewed the patients History and Physical, chart, labs and discussed the procedure including the risks, benefits and alternatives for the proposed anesthesia with the patient or authorized representative who has indicated his/her understanding and acceptance.     Plan Discussed with:   Anesthesia Plan Comments:         Anesthesia Quick Evaluation

## 2011-12-18 NOTE — Transfer of Care (Signed)
Immediate Anesthesia Transfer of Care Note  Patient: Colleen Parks  Procedure(s) Performed: Procedure(s) (LRB): CATARACT EXTRACTION PHACO AND INTRAOCULAR LENS PLACEMENT (IOC) (Right)  Patient Location: PACU and Short Stay  Anesthesia Type: MAC  Level of Consciousness: awake  Airway & Oxygen Therapy: Patient Spontanous Breathing  Post-op Assessment: Report given to PACU RN  Post vital signs: Reviewed and stable  Complications: No apparent anesthesia complications

## 2011-12-18 NOTE — Brief Op Note (Signed)
Pre-Op Dx: Cataract OD Post-Op Dx: Cataract OD Surgeon: Tonny Branch Anesthesia: Topical with MAC Implant: B&L enVista Specimen: None Complications: None

## 2011-12-18 NOTE — Discharge Instructions (Signed)
Colleen Parks  12/18/2011     Instructions  1. Use medications as Instructed.  Shake well before use. Wait 5 minutes between drops.  {OPHTHALMIC ANTIBIOTICS:22167} 4 times a day x 1 week.  {OPHTHALMIC ANTI-INFLAMMATORY:22168} 2 times a day x 4 weeks.  {OPHTHALMIC STEROID:22169} 4 times a day - week 1   3 times a day - Week 2, 2 times a day- Week 3, 1 time a day - Week 4.  2. Do not rub the operative eye. Do not swim underwater for 2 weeks.  3. You may remove the clear shield and resume your normal activities the day after  Surgery. Your eyes may feel more comfortable if you wear dark glasses outside.  4. Call our office at [phone] if you have sudden change in vision, extreme redness or pain. Some fluctuation in vision is normal after surgery. If you have an emergency after hours, call Dr. Geoffry Paradise at [phone].  5. It is important that you attend all of your follow-up appointments.        Follow-up:{follow up:32580} with Tonny Branch, MD.   Dr. Loran Senters: [phone]  Dr. Iona HansenQN:[id number]  Dr. Geoffry ParadiseBB:[id number]   If you find that you cannot contact your physician, but feel that your signs and   Symptoms warrant a physician's attention, call the Emergency Room at   [phone] ext.[phone].   Other{NA AND XH:[id number].

## 2011-12-22 ENCOUNTER — Encounter (HOSPITAL_COMMUNITY): Payer: Self-pay | Admitting: Ophthalmology

## 2012-01-05 ENCOUNTER — Ambulatory Visit (HOSPITAL_COMMUNITY)
Admission: RE | Admit: 2012-01-05 | Discharge: 2012-01-05 | Disposition: A | Discharge status: Home / Self Care | Payer: Medicare Other | Source: Ambulatory Visit | Attending: Cardiovascular Disease | Admitting: Cardiovascular Disease

## 2012-01-05 DIAGNOSIS — R0989 Other specified symptoms and signs involving the circulatory and respiratory systems: Secondary | ICD-10-CM

## 2012-01-05 DIAGNOSIS — I10 Essential (primary) hypertension: Secondary | ICD-10-CM | POA: Insufficient documentation

## 2012-01-05 DIAGNOSIS — E119 Type 2 diabetes mellitus without complications: Secondary | ICD-10-CM | POA: Insufficient documentation

## 2012-01-05 DIAGNOSIS — I6529 Occlusion and stenosis of unspecified carotid artery: Secondary | ICD-10-CM | POA: Insufficient documentation

## 2012-01-06 ENCOUNTER — Telehealth: Payer: Self-pay

## 2012-01-06 ENCOUNTER — Other Ambulatory Visit: Payer: Self-pay

## 2012-01-06 DIAGNOSIS — R0989 Other specified symptoms and signs involving the circulatory and respiratory systems: Secondary | ICD-10-CM

## 2012-01-21 ENCOUNTER — Encounter (HOSPITAL_COMMUNITY): Payer: Self-pay | Admitting: Pharmacy Technician

## 2012-01-22 ENCOUNTER — Encounter (HOSPITAL_COMMUNITY)
Admission: RE | Admit: 2012-01-22 | Discharge: 2012-01-22 | Payer: Medicare Other | Source: Ambulatory Visit | Admitting: Ophthalmology

## 2012-01-23 ENCOUNTER — Encounter (HOSPITAL_COMMUNITY): Payer: Self-pay

## 2012-01-23 MED ORDER — LIDOCAINE HCL 3.5 % OP GEL
OPHTHALMIC | Status: AC
  Filled 2012-01-23: qty 5

## 2012-01-23 MED ORDER — TETRACAINE HCL 0.5 % OP SOLN
OPHTHALMIC | Status: AC
  Filled 2012-01-23: qty 2

## 2012-01-23 MED ORDER — LIDOCAINE HCL (PF) 1 % IJ SOLN
INTRAMUSCULAR | Status: AC
  Filled 2012-01-23: qty 2

## 2012-01-23 MED ORDER — NEOMYCIN-POLYMYXIN-DEXAMETH 3.5-10000-0.1 OP OINT
TOPICAL_OINTMENT | OPHTHALMIC | Status: AC
  Filled 2012-01-23: qty 3.5

## 2012-01-23 MED ORDER — PHENYLEPHRINE HCL 2.5 % OP SOLN
OPHTHALMIC | Status: AC
  Filled 2012-01-23: qty 2

## 2012-01-23 MED ORDER — CYCLOPENTOLATE-PHENYLEPHRINE 0.2-1 % OP SOLN
OPHTHALMIC | Status: AC
  Filled 2012-01-23: qty 2

## 2012-01-26 ENCOUNTER — Encounter (HOSPITAL_COMMUNITY)
Admission: RE | Disposition: A | Discharge status: Home / Self Care | Payer: Self-pay | Source: Ambulatory Visit | Attending: Ophthalmology

## 2012-01-26 ENCOUNTER — Encounter (HOSPITAL_COMMUNITY): Payer: Self-pay | Admitting: Anesthesiology

## 2012-01-26 ENCOUNTER — Ambulatory Visit (HOSPITAL_COMMUNITY): Payer: Medicare Other | Admitting: Anesthesiology

## 2012-01-26 ENCOUNTER — Ambulatory Visit (HOSPITAL_COMMUNITY)
Admission: RE | Admit: 2012-01-26 | Discharge: 2012-01-26 | Disposition: A | Discharge status: Home / Self Care | Payer: Medicare Other | Source: Ambulatory Visit | Attending: Ophthalmology | Admitting: Ophthalmology

## 2012-01-26 ENCOUNTER — Encounter (HOSPITAL_COMMUNITY): Payer: Self-pay | Admitting: *Deleted

## 2012-01-26 DIAGNOSIS — Z951 Presence of aortocoronary bypass graft: Secondary | ICD-10-CM | POA: Insufficient documentation

## 2012-01-26 DIAGNOSIS — E119 Type 2 diabetes mellitus without complications: Secondary | ICD-10-CM | POA: Insufficient documentation

## 2012-01-26 DIAGNOSIS — H2589 Other age-related cataract: Secondary | ICD-10-CM | POA: Insufficient documentation

## 2012-01-26 DIAGNOSIS — H2181 Floppy iris syndrome: Secondary | ICD-10-CM | POA: Insufficient documentation

## 2012-01-26 DIAGNOSIS — Z79899 Other long term (current) drug therapy: Secondary | ICD-10-CM | POA: Insufficient documentation

## 2012-01-26 DIAGNOSIS — I10 Essential (primary) hypertension: Secondary | ICD-10-CM | POA: Insufficient documentation

## 2012-01-26 DIAGNOSIS — Z01812 Encounter for preprocedural laboratory examination: Secondary | ICD-10-CM | POA: Insufficient documentation

## 2012-01-26 HISTORY — PX: CATARACT EXTRACTION W/PHACO: SHX586

## 2012-01-26 LAB — GLUCOSE, CAPILLARY: Glucose-Capillary: 89 mg/dL (ref 70–99)

## 2012-01-26 SURGERY — PHACOEMULSIFICATION, CATARACT, WITH IOL INSERTION
Anesthesia: Monitor Anesthesia Care | Site: Eye | Laterality: Left | Wound class: Clean

## 2012-01-26 MED ORDER — TETRACAINE HCL 0.5 % OP SOLN
1.0000 [drp] | OPHTHALMIC | Status: AC
  Administered 2012-01-26 (×3): 1 [drp] via OPHTHALMIC

## 2012-01-26 MED ORDER — LIDOCAINE HCL 3.5 % OP GEL: 1.0000 "application " | Freq: Once | OPHTHALMIC | Status: DC

## 2012-01-26 MED ORDER — ONDANSETRON HCL 4 MG/2ML IJ SOLN: 4.0000 mg | Freq: Once | INTRAMUSCULAR | Status: DC | PRN

## 2012-01-26 MED ORDER — FENTANYL CITRATE 0.05 MG/ML IJ SOLN: 25.0000 ug | INTRAMUSCULAR | Status: DC | PRN

## 2012-01-26 MED ORDER — NEOMYCIN-POLYMYXIN-DEXAMETH 0.1 % OP OINT
TOPICAL_OINTMENT | OPHTHALMIC | Status: DC | PRN
  Administered 2012-01-26: 1 via OPHTHALMIC

## 2012-01-26 MED ORDER — LACTATED RINGERS IV SOLN
INTRAVENOUS | Status: DC
  Administered 2012-01-26: 12:00:00 via INTRAVENOUS

## 2012-01-26 MED ORDER — TETRACAINE HCL 0.5 % OP SOLN
OPHTHALMIC | Status: AC
  Filled 2012-01-26: qty 2

## 2012-01-26 MED ORDER — FLURBIPROFEN SODIUM 0.03 % OP SOLN
OPHTHALMIC | Status: AC
  Filled 2012-01-26: qty 2.5

## 2012-01-26 MED ORDER — BSS IO SOLN
INTRAOCULAR | Status: DC | PRN
  Administered 2012-01-26: 15 mL via INTRAOCULAR

## 2012-01-26 MED ORDER — LIDOCAINE 3.5 % OP GEL OPTIME - NO CHARGE
OPHTHALMIC | Status: DC | PRN
  Administered 2012-01-26: 2 [drp] via OPHTHALMIC

## 2012-01-26 MED ORDER — MIDAZOLAM HCL 2 MG/2ML IJ SOLN
1.0000 mg | INTRAMUSCULAR | Status: DC | PRN
  Administered 2012-01-26: 2 mg via INTRAVENOUS

## 2012-01-26 MED ORDER — PHENYLEPHRINE HCL 2.5 % OP SOLN
1.0000 [drp] | OPHTHALMIC | Status: AC
  Administered 2012-01-26 (×3): 1 [drp] via OPHTHALMIC

## 2012-01-26 MED ORDER — EPINEPHRINE HCL 1 MG/ML IJ SOLN
INTRAOCULAR | Status: DC | PRN
  Administered 2012-01-26: 12:00:00

## 2012-01-26 MED ORDER — POVIDONE-IODINE 5 % OP SOLN
OPHTHALMIC | Status: DC | PRN
  Administered 2012-01-26: 1 via OPHTHALMIC

## 2012-01-26 MED ORDER — MIDAZOLAM HCL 2 MG/2ML IJ SOLN
INTRAMUSCULAR | Status: AC
  Filled 2012-01-26: qty 2

## 2012-01-26 MED ORDER — LIDOCAINE HCL (PF) 1 % IJ SOLN
INTRAMUSCULAR | Status: DC | PRN
  Administered 2012-01-26: .6 mL

## 2012-01-26 MED ORDER — CYCLOPENTOLATE-PHENYLEPHRINE 0.2-1 % OP SOLN
1.0000 [drp] | OPHTHALMIC | Status: AC
  Administered 2012-01-26 (×3): 1 [drp] via OPHTHALMIC

## 2012-01-26 MED ORDER — TETRACAINE 0.5 % OP SOLN OPTIME - NO CHARGE
OPHTHALMIC | Status: DC | PRN
  Administered 2012-01-26: 2 [drp] via OPHTHALMIC

## 2012-01-26 MED ORDER — PROVISC 10 MG/ML IO SOLN
INTRAOCULAR | Status: DC | PRN
  Administered 2012-01-26: 8.5 mg via INTRAOCULAR

## 2012-01-26 MED ORDER — EPINEPHRINE HCL 1 MG/ML IJ SOLN
INTRAMUSCULAR | Status: AC
  Filled 2012-01-26: qty 1

## 2012-01-26 MED ORDER — CYCLOPENTOLATE-PHENYLEPHRINE 0.2-1 % OP SOLN
OPHTHALMIC | Status: AC
  Filled 2012-01-26: qty 2

## 2012-01-26 MED ORDER — PHENYLEPHRINE HCL 2.5 % OP SOLN
OPHTHALMIC | Status: AC
  Filled 2012-01-26: qty 2

## 2012-01-26 SURGICAL SUPPLY — 32 items

## 2012-01-26 NOTE — Transfer of Care (Signed)
Immediate Anesthesia Transfer of Care Note  Patient: Colleen Parks  Procedure(s) Performed: Procedure(s) (LRB): CATARACT EXTRACTION PHACO AND INTRAOCULAR LENS PLACEMENT (IOC) (Left)  Patient Location: PACU and Short Stay  Anesthesia Type: MAC  Level of Consciousness: awake, alert , oriented and patient cooperative  Airway & Oxygen Therapy: Patient Spontanous Breathing  Post-op Assessment: Report given to PACU RN, Post -op Vital signs reviewed and stable and Patient moving all extremities  Post vital signs: Reviewed and stable  Complications: No apparent anesthesia complications

## 2012-01-26 NOTE — Discharge Instructions (Signed)
Colleen Parks  01/26/2012     Instructions  1. Use medications as Instructed.  Shake well before use. Wait 5 minutes between drops.  {OPHTHALMIC ANTIBIOTICS:22167} 4 times a day x 1 week.  {OPHTHALMIC ANTI-INFLAMMATORY:22168} 2 times a day x 4 weeks.  {OPHTHALMIC STEROID:22169} 4 times a day - week 1   3 times a day - Week 2, 2 times a day- Week 3, 1 time a day - Week 4.  2. Do not rub the operative eye. Do not swim underwater for 2 weeks.  3. You may remove the clear shield and resume your normal activities the day after  Surgery. Your eyes may feel more comfortable if you wear dark glasses outside.  4. Call our office at [phone] if you have sudden change in vision, extreme redness or pain. Some fluctuation in vision is normal after surgery. If you have an emergency after hours, call Dr. Geoffry Paradise at [phone].  5. It is important that you attend all of your follow-up appointments.        Follow-up:{follow up:32580} with Tonny Branch, MD.   Dr. Loran Senters: [phone]  Dr. Iona HansenJI:[id number]  Dr. Geoffry ParadiseID:[id number]   If you find that you cannot contact your physician, but feel that your signs and   Symptoms warrant a physician's attention, call the Emergency Room at   [phone] ext.[phone].   Other{NA AND VA:[id number].

## 2012-01-26 NOTE — H&P (Signed)
I have reviewed the H&P, the patient was re-examined, and I have identified no interval changes in medical condition and plan of care since the history and physical of record  

## 2012-01-26 NOTE — Op Note (Signed)
Colleen Parks, Colleen Parks             ACCOUNT NO.:  [id number]  MEDICAL RECORD NO.:  [id number]  LOCATION:  [facility]                          FACILITY:  [facility]  PHYSICIAN:  Richardo Hanks, MD       DATE OF BIRTH:  1935-04-10  DATE OF PROCEDURE:  01/26/2012 DATE OF DISCHARGE:  01/26/2012                              OPERATIVE REPORT   PREOPERATIVE DIAGNOSIS:  Combined cataract, left eye, diagnosis code 366.19.  POSTOPERATIVE DIAGNOSES:  Combined cataract, left eye, diagnosis code 366.19.  Intraoperative floppy iris syndrome, diagnosis code 364.81.  SURGEON:  Franky Macho. Torrance Frech, MD  ANESTHESIA:  Topical with monitored anesthesia care.  DESCRIPTION OF THE OPERATION:  In the preoperative holding area, dilating drop and viscous lidocaine were placed into the left eye.  The patient was then brought to the operating room where she was prepped and draped.  Beginning with a #75 blade, a paracentesis port was made at the surgeon's 2 o'clock position.  The anterior chamber was filled with a 1% nonpreserved lidocaine solution.  Because of poor visualization of the red reflex, the anterior chamber was filled with VisionBlue and the VisionBlue was then rinsed from the anterior chamber with balanced salt solution.  The anterior chamber was then filled with Provisc.  A 2.4-mm keratome blade was then used to make a clear corneal incision at the temporal limbus.  A bent cystotome needle was used to create a continuous tear capsulotomy.  Hydrodissection was performed with balanced salt solution and a fine cannula.  The lens nucleus was then removed using phacoemulsification and a quadrant cracking technique. Residual cortex was removed with irrigation and aspiration.  A capsular bag and anterior chamber were refilled with Provisc and a posterior chamber intraocular lens was placed into the capsular bag without difficulty using its lens injecting system.  The Provisc was then removed from the capsular bag and  anterior chamber with irrigation and aspiration.  Stromal hydration of the main incision and paracentesis ports was performed with balanced salt solution and a fine cannula.  The wounds were tested for leak, which were negative.  The patient tolerated the procedure well.  There were no operative complications and she was returned to the recovery area in satisfactory condition.  No surgical specimens.  Prosthetic device used is a [employer], EnVista posterior chamber lens, model MX60, power of 23.0, serial number is [id number].         ______________________________ Richardo Hanks, MD    KEH/MEDQ  D:  01/26/2012  T:  01/26/2012  Job:  [id number]

## 2012-01-26 NOTE — Brief Op Note (Signed)
Pre-Op Dx: Cataract OS Post-Op Dx: Cataract OS. IFIS OS Surgeon: Tonny Branch Anesthesia: Topical with MAC Surgery: Cataract Extraction with Intraocular lens Implant OS Implant: B&L enVista Specimen: None Complications: None

## 2012-01-26 NOTE — Anesthesia Preprocedure Evaluation (Signed)
Anesthesia Evaluation  Patient identified by MRN, date of birth, ID band Patient awake    Reviewed: Allergy & Precautions, H&P , NPO status , Patient's Chart, lab work & pertinent test results  Airway Mallampati: II      Dental  (+) Teeth Intact   Pulmonary  breath sounds clear to auscultation        Cardiovascular hypertension, Pt. on medications + Peripheral Vascular Disease CABG: subclavian and renal artery stenosis. Rhythm:Regular     Neuro/Psych    GI/Hepatic GERD-  Medicated and Controlled,  Endo/Other  Diabetes mellitus-, Well Controlled, Type 2  Renal/GU      Musculoskeletal   Abdominal   Peds  Hematology   Anesthesia Other Findings   Reproductive/Obstetrics                           Anesthesia Physical Anesthesia Plan  ASA: III  Anesthesia Plan: MAC   Post-op Pain Management:    Induction: Intravenous  Airway Management Planned: Nasal Cannula  Additional Equipment:   Intra-op Plan:   Post-operative Plan:   Informed Consent: I have reviewed the patients History and Physical, chart, labs and discussed the procedure including the risks, benefits and alternatives for the proposed anesthesia with the patient or authorized representative who has indicated his/her understanding and acceptance.     Plan Discussed with:   Anesthesia Plan Comments:         Anesthesia Quick Evaluation

## 2012-01-26 NOTE — Anesthesia Postprocedure Evaluation (Signed)
  Anesthesia Post-op Note  Patient: Colleen Parks  Procedure(s) Performed: Procedure(s) (LRB): CATARACT EXTRACTION PHACO AND INTRAOCULAR LENS PLACEMENT (IOC) (Left)  Patient Location: PACU and Short Stay  Anesthesia Type: MAC  Level of Consciousness: awake, alert , oriented and patient cooperative  Airway and Oxygen Therapy: Patient Spontanous Breathing  Post-op Pain: none  Post-op Assessment: Post-op Vital signs reviewed, Patient's Cardiovascular Status Stable, Respiratory Function Stable, Patent Airway, No signs of Nausea or vomiting and Pain level controlled  Post-op Vital Signs: Reviewed and stable  Complications: No apparent anesthesia complications

## 2012-01-28 ENCOUNTER — Encounter (HOSPITAL_COMMUNITY): Payer: Self-pay | Admitting: Ophthalmology

## 2012-04-16 DIAGNOSIS — I771 Stricture of artery: Secondary | ICD-10-CM | POA: Insufficient documentation

## 2012-04-16 DIAGNOSIS — I701 Atherosclerosis of renal artery: Secondary | ICD-10-CM | POA: Insufficient documentation

## 2012-04-16 DIAGNOSIS — E7800 Pure hypercholesterolemia, unspecified: Secondary | ICD-10-CM | POA: Insufficient documentation

## 2012-04-16 DIAGNOSIS — C801 Malignant (primary) neoplasm, unspecified: Secondary | ICD-10-CM | POA: Insufficient documentation

## 2012-04-19 ENCOUNTER — Encounter: Payer: Self-pay | Admitting: Cardiovascular Disease

## 2012-04-19 ENCOUNTER — Ambulatory Visit (INDEPENDENT_AMBULATORY_CARE_PROVIDER_SITE_OTHER): Payer: Medicare Other | Admitting: Cardiovascular Disease

## 2012-04-19 VITALS — BP 152/82 | HR 76 | Wt 135.0 lb

## 2012-04-19 DIAGNOSIS — E782 Mixed hyperlipidemia: Secondary | ICD-10-CM

## 2012-04-19 DIAGNOSIS — E785 Hyperlipidemia, unspecified: Secondary | ICD-10-CM

## 2012-04-19 DIAGNOSIS — I10 Essential (primary) hypertension: Secondary | ICD-10-CM

## 2012-04-19 DIAGNOSIS — R0989 Other specified symptoms and signs involving the circulatory and respiratory systems: Secondary | ICD-10-CM

## 2012-04-19 DIAGNOSIS — E119 Type 2 diabetes mellitus without complications: Secondary | ICD-10-CM

## 2012-04-19 MED ORDER — ROSUVASTATIN CALCIUM 20 MG PO TABS: 20.0000 mg | ORAL_TABLET | Freq: Every day | ORAL | Status: DC

## 2012-04-19 NOTE — Assessment & Plan Note (Signed)
Discussed low carb diet.  Target hemoglobin A1c is 6.5 or less.  Continue current medications.  

## 2012-04-19 NOTE — Assessment & Plan Note (Signed)
Change to crestor 20 mg see if she can tolerate  Labs in 3 months

## 2012-04-19 NOTE — Assessment & Plan Note (Signed)
Well controlled.  Continue current medications and low sodium Dash type diet.   Consider renal duplex if Cr elevates any more

## 2012-04-19 NOTE — Patient Instructions (Addendum)
Your physician wants you to follow-up in:  Larose will receive a reminder letter in the mail two months in advance. If you don't receive a letter, please call our office to schedule the follow-up appointment. Your physician has recommended you make the following change in your medication:  STOP  LOVASTATIN  START CRESTOR 20 MG EVERY  EVENING

## 2012-04-19 NOTE — Progress Notes (Signed)
Patient ID: Colleen Parks, female   DOB: 10-25-1934, 77 y.o.   MRN: [id number] Colleen Parks is seen today for F/U of elevated lipids, HTN. She has previously been seen by Colleen Parks from [facility]. There was a question of carotid disease but duplex done 12/10 showed no significatn disease. Last duplex [id number] with A999333 LICA stenosis.  F/U in December 2013   CRF's include elevated lipid and HTN which is finally Rx. She also had a HbA1c of 7.3 and has begun Rx for type 2 DM. She is active in the garden and bowls She is not having any sscp, palpitations, PND, orthopnea or edema. She had a normal myouve in 2008 with no documented CAD Looking forward to going to [place] to bowl. Some muscular pain in back of right neck Was on zocor 80 mg and we changed to [place] lab work.  ACE stopped due to K 5.5 and Cr 1.4  LDL 175 A1C 5.8 TSH normal   ROS: Denies fever, malais, weight loss, blurry vision, decreased visual acuity, cough, sputum, SOB, hemoptysis, pleuritic pain, palpitaitons, heartburn, abdominal pain, melena, lower extremity edema, claudication, or rash.  All other systems reviewed and negative  General: Affect appropriate Healthy:  appears stated age 77: normal Neck supple with no adenopathy JVP normal left  bruits no thyromegaly Lungs clear with no wheezing and good diaphragmatic motion Heart:  S1/S2 no murmur, no rub, gallop or click PMI normal Abdomen: benighn, BS positve, no tenderness, no AAA no bruit.  No HSM or HJR Distal pulses intact with no bruits No edema Neuro non-focal Skin warm and dry No muscular weakness   Current Outpatient Prescriptions  Medication Sig Dispense Refill  . aspirin 81 MG EC tablet Take 81 mg by mouth every morning.       . Cinnamon 500 MG TABS Take 500 mg by mouth daily.      Marland Kitchen gemfibrozil (LOPID) 600 MG tablet Take 600 mg by mouth 2 (two) times daily.        . hydrochlorothiazide (MICROZIDE) 12.5 MG capsule Take 12.5 mg by mouth daily.        Marland Kitchen LORazepam (ATIVAN) 0.5 MG tablet Take 0.5 mg by mouth daily as needed. For traveling anxiety      . lovastatin (MEVACOR) 20 MG tablet Take 20 mg by mouth at bedtime.       . metFORMIN (GLUCOPHAGE) 500 MG tablet Take 250-500 mg by mouth 2 (two) times daily. Take 1 tablet in the morning and 1/2 a tablet at bedtime      . ranitidine (ZANTAC) 150 MG tablet Take 150 mg by mouth at bedtime. For heartburn        Allergies  Codeine  Electrocardiogram:  NSR rate 76 normal ECG  Assessment and Plan

## 2012-04-19 NOTE — Assessment & Plan Note (Signed)
A999333 LICA on duplex 0000000 done at [facility].  F/U in December.  Rx cholesterol and ASA

## 2012-07-06 ENCOUNTER — Other Ambulatory Visit (HOSPITAL_COMMUNITY): Payer: Self-pay | Admitting: Family Medicine

## 2012-07-06 DIAGNOSIS — Z139 Encounter for screening, unspecified: Secondary | ICD-10-CM

## 2012-08-16 ENCOUNTER — Ambulatory Visit (HOSPITAL_COMMUNITY)
Admission: RE | Admit: 2012-08-16 | Discharge: 2012-08-16 | Disposition: A | Discharge status: Home / Self Care | Payer: Medicare Other | Source: Ambulatory Visit | Attending: Family Medicine | Admitting: Family Medicine

## 2012-08-16 DIAGNOSIS — Z1231 Encounter for screening mammogram for malignant neoplasm of breast: Secondary | ICD-10-CM | POA: Insufficient documentation

## 2012-08-16 DIAGNOSIS — Z139 Encounter for screening, unspecified: Secondary | ICD-10-CM

## 2012-09-10 ENCOUNTER — Emergency Department (HOSPITAL_COMMUNITY): Payer: Medicare Other

## 2012-09-10 ENCOUNTER — Ambulatory Visit (HOSPITAL_COMMUNITY)
Admission: RE | Admit: 2012-09-10 | Discharge: 2012-09-10 | Disposition: A | Discharge status: Home / Self Care | Payer: Medicare Other | Source: Ambulatory Visit | Attending: Family Medicine | Admitting: Family Medicine

## 2012-09-10 ENCOUNTER — Emergency Department (HOSPITAL_COMMUNITY)
Admission: EM | Admit: 2012-09-10 | Discharge: 2012-09-10 | Disposition: A | Discharge status: Home / Self Care | Payer: Medicare Other | Attending: Emergency Medicine | Admitting: Emergency Medicine

## 2012-09-10 ENCOUNTER — Encounter (HOSPITAL_COMMUNITY): Payer: Self-pay | Admitting: *Deleted

## 2012-09-10 ENCOUNTER — Other Ambulatory Visit (HOSPITAL_COMMUNITY): Payer: Self-pay | Admitting: Family Medicine

## 2012-09-10 DIAGNOSIS — E119 Type 2 diabetes mellitus without complications: Secondary | ICD-10-CM | POA: Insufficient documentation

## 2012-09-10 DIAGNOSIS — Z8719 Personal history of other diseases of the digestive system: Secondary | ICD-10-CM | POA: Insufficient documentation

## 2012-09-10 DIAGNOSIS — Z79899 Other long term (current) drug therapy: Secondary | ICD-10-CM | POA: Insufficient documentation

## 2012-09-10 DIAGNOSIS — R079 Chest pain, unspecified: Secondary | ICD-10-CM | POA: Insufficient documentation

## 2012-09-10 DIAGNOSIS — R0989 Other specified symptoms and signs involving the circulatory and respiratory systems: Secondary | ICD-10-CM | POA: Insufficient documentation

## 2012-09-10 DIAGNOSIS — E7800 Pure hypercholesterolemia, unspecified: Secondary | ICD-10-CM | POA: Insufficient documentation

## 2012-09-10 DIAGNOSIS — R0609 Other forms of dyspnea: Secondary | ICD-10-CM | POA: Insufficient documentation

## 2012-09-10 DIAGNOSIS — M25511 Pain in right shoulder: Secondary | ICD-10-CM

## 2012-09-10 DIAGNOSIS — K859 Acute pancreatitis without necrosis or infection, unspecified: Secondary | ICD-10-CM | POA: Insufficient documentation

## 2012-09-10 DIAGNOSIS — Z7982 Long term (current) use of aspirin: Secondary | ICD-10-CM | POA: Insufficient documentation

## 2012-09-10 DIAGNOSIS — Z853 Personal history of malignant neoplasm of breast: Secondary | ICD-10-CM | POA: Insufficient documentation

## 2012-09-10 DIAGNOSIS — Z87891 Personal history of nicotine dependence: Secondary | ICD-10-CM | POA: Insufficient documentation

## 2012-09-10 DIAGNOSIS — R091 Pleurisy: Secondary | ICD-10-CM

## 2012-09-10 DIAGNOSIS — Z8679 Personal history of other diseases of the circulatory system: Secondary | ICD-10-CM | POA: Insufficient documentation

## 2012-09-10 DIAGNOSIS — I10 Essential (primary) hypertension: Secondary | ICD-10-CM | POA: Insufficient documentation

## 2012-09-10 LAB — CBC WITH DIFFERENTIAL/PLATELET
Eosinophils Absolute: 0.1 10*3/uL (ref 0.0–0.7)
Eosinophils Relative: 1 % (ref 0–5)
Lymphocytes Relative: 21 % (ref 12–46)
Lymphs Abs: 2.1 10*3/uL (ref 0.7–4.0)
MCHC: 33 g/dL (ref 30.0–36.0)
Monocytes Relative: 9 % (ref 3–12)
Neutro Abs: 7 10*3/uL (ref 1.7–7.7)
Platelets: 337 10*3/uL (ref 150–400)
RDW: 12.4 % (ref 11.5–15.5)
WBC: 10.1 10*3/uL (ref 4.0–10.5)

## 2012-09-10 LAB — URINE MICROSCOPIC-ADD ON

## 2012-09-10 LAB — URINALYSIS, ROUTINE W REFLEX MICROSCOPIC
Glucose, UA: NEGATIVE mg/dL
Ketones, ur: NEGATIVE mg/dL
Leukocytes, UA: NEGATIVE
pH: 6 (ref 5.0–8.0)

## 2012-09-10 MED ORDER — IOHEXOL 350 MG/ML SOLN
100.0000 mL | Freq: Once | INTRAVENOUS | Status: AC | PRN
  Administered 2012-09-10: 100 mL via INTRAVENOUS

## 2012-09-10 NOTE — ED Notes (Signed)
C/o right sided rib pain x 3 days.  Seen at Dr. Vickey Sages office this morning and sent here for abnormal labs.  Pt c/o worsening pain with deep breath.  Denies injury.

## 2012-09-10 NOTE — ED Notes (Signed)
Per Dr. Karie Kirks, D/c to Home/self-care.  Pt to f/u in his office on Monday 09/13/12.  Pt verbalized understanding.

## 2012-09-10 NOTE — ED Notes (Signed)
Dr. Karie Kirks at bedside evaluating pt.

## 2012-09-10 NOTE — ED Provider Notes (Signed)
History    This chart was scribed for Maudry Diego, MD, MD by Rhae Lerner, ED Scribe. The patient was seen in room [facility] and the patient's care was started at 1:39 PM.   CSN: [id number]  Arrival date & time 09/10/12  1323      No chief complaint on file.   (Consider location/radiation/quality/duration/timing/severity/associated sxs/prior treatment) Patient is a 77 y.o. female presenting with chest pain. The history is provided by the patient. No language interpreter was used.  Chest Pain The chest pain began 3 - 5 days ago. Chest pain occurs constantly. The chest pain is unchanged. The pain is associated with breathing. The severity of the pain is moderate. The pain does not radiate. Pertinent negatives for primary symptoms include no fatigue, no cough and no abdominal pain.  Pertinent negatives for past medical history include no seizures.    Colleen Parks is a 77 y.o. female who presents to the Emergency Department referred to ED from Dr. Karie Kirks due to d-dimer being elevated today at office. Pt reports that she has constant, moderate right lateral chest wall pain onset 3 days ago. Pt reports that pain is aggravated by breathing deeply. She denies radiation of pain, recent injury, fall and any other pain.   Past Medical History  Diagnosis Date  . Hypercholesterolemia   . RAS (renal artery stenosis)   . Subclavian arterial stenosis   . HTN (hypertension)   . Diabetes mellitus   . GERD (gastroesophageal reflux disease)   . Anxiety   . Cancer     breast    Past Surgical History  Procedure Date  . Left breast mastectomy with chemo 1995   . Biopsy thyroid   . Cataract extraction w/phaco 12/18/2011    Procedure: CATARACT EXTRACTION PHACO AND INTRAOCULAR LENS PLACEMENT (IOC);  Surgeon: Tonny Branch, MD;  Location: [facility];  Service: Ophthalmology;  Laterality: Right;  CDE:17.77  . Cataract extraction w/phaco 01/26/2012    Procedure: CATARACT EXTRACTION PHACO AND INTRAOCULAR  LENS PLACEMENT (IOC);  Surgeon: Tonny Branch, MD;  Location: [facility];  Service: Ophthalmology;  Laterality: Left;  CDE 15.23    Family History  Problem Relation Age of Onset  . Coronary artery disease      positive for premature- on fathers side  . Anesthesia problems Neg Hx   . Hypotension Neg Hx   . Malignant hyperthermia Neg Hx   . Pseudochol deficiency Neg Hx     History  Substance Use Topics  . Smoking status: Former Smoker -- 0.5 packs/day for 4 years    Types: Cigarettes    Quit date: 12/09/1958  . Smokeless tobacco: Not on file     Comment: non-smoker  . Alcohol Use: No    OB History    Grav Para Term Preterm Abortions TAB SAB Ect Mult Living                  Review of Systems  Constitutional: Negative for fatigue.  HENT: Negative for congestion, sinus pressure and ear discharge.   Eyes: Negative for discharge.  Respiratory: Negative for cough.   Cardiovascular: Positive for chest pain.  Gastrointestinal: Negative for abdominal pain and diarrhea.  Genitourinary: Negative for frequency and hematuria.  Musculoskeletal: Negative for back pain.  Skin: Negative for rash.  Neurological: Negative for seizures and headaches.  Hematological: Negative.   Psychiatric/Behavioral: Negative for hallucinations.  All other systems reviewed and are negative.    Allergies  Codeine  Home Medications  Current Outpatient Rx  Name  Route  Sig  Dispense  Refill  . ASPIRIN 81 MG PO TBEC   Oral   Take 81 mg by mouth every morning.          Marland Kitchen CINNAMON 500 MG PO TABS   Oral   Take 500 mg by mouth daily.         Marland Kitchen GEMFIBROZIL 600 MG PO TABS   Oral   Take 600 mg by mouth 2 (two) times daily.           Marland Kitchen HYDROCHLOROTHIAZIDE 12.5 MG PO CAPS   Oral   Take 12.5 mg by mouth daily.         Marland Kitchen LORAZEPAM 0.5 MG PO TABS   Oral   Take 0.5 mg by mouth daily as needed. For traveling anxiety         . METFORMIN HCL 500 MG PO TABS   Oral   Take 250-500 mg by mouth 2  (two) times daily. Take 1 tablet in the morning and 1/2 a tablet at bedtime         . RANITIDINE HCL 150 MG PO TABS   Oral   Take 150 mg by mouth at bedtime. For heartburn         . ROSUVASTATIN CALCIUM 20 MG PO TABS   Oral   Take 1 tablet (20 mg total) by mouth daily.   90 tablet   3     BP 149/53  Pulse 74  Temp 97.5 F (36.4 C) (Oral)  Resp 20  SpO2 100%  Physical Exam  Nursing note and vitals reviewed. Constitutional: She is oriented to person, place, and time. She appears well-developed.  HENT:  Head: Normocephalic and atraumatic.  Eyes: Conjunctivae normal and EOM are normal. No scleral icterus.  Neck: Neck supple. No thyromegaly present.  Cardiovascular: Normal rate and regular rhythm.  Exam reveals no gallop and no friction rub.   No murmur heard. Pulmonary/Chest: No stridor. She has no wheezes. She has no rales. She exhibits no tenderness.  Abdominal: She exhibits no distension. There is no tenderness. There is no rebound.  Musculoskeletal: Normal range of motion. She exhibits no edema.  Lymphadenopathy:    She has no cervical adenopathy.  Neurological: She is oriented to person, place, and time. Coordination normal.  Skin: No rash noted. No erythema.  Psychiatric: She has a normal mood and affect. Her behavior is normal.    ED Course  Procedures (including critical care time) DIAGNOSTIC STUDIES: Oxygen Saturation is 100% on room air, normal by my interpretation.    COORDINATION OF CARE: 1:42 PM Discussed ED treatment with pt , 2:18 PM Ordered:  Medications  gemfibrozil (LOPID) 600 MG tablet (not administered)  lisinopril (PRINIVIL,ZESTRIL) 5 MG tablet (not administered)  iohexol (OMNIPAQUE) 350 MG/ML injection 100 mL (100 mL Intravenous Contrast Given 09/10/12 1419)        Labs Reviewed  CBC WITH DIFFERENTIAL - Abnormal; Notable for the following:    Hemoglobin 11.6 (*)     HCT 35.2 (*)     All other components within normal limits   Dg  Chest 2 View  09/10/2012  *RADIOLOGY REPORT*  Clinical Data: Chest pain  CHEST - 2 VIEW  Comparison: None.  Findings: Linear scarring or subsegmental atelectasis in the lateral and posterior basal segment left lower lobe.  Lungs otherwise clear.  Heart size normal.  Atheromatous aorta.  No effusion.  Regional bones unremarkable.  IMPRESSION:  1.  Linear left lower lobe atelectasis or scarring   Original Report Authenticated By: D. Hassell III, MD    Ct Angio Chest Pe W/cm &/or Wo Cm  09/10/2012  *RADIOLOGY REPORT*  Clinical Data: Short of breath, history renal artery stenosis. History of left breast cancer.  Concern for pulmonary embolism.  CT ANGIOGRAPHY CHEST  Technique:  Multidetector CT imaging of the chest using the standard protocol during bolus administration of intravenous contrast. Multiplanar reconstructed images including MIPs were obtained and reviewed to evaluate the vascular anatomy.  Contrast: 173mL OMNIPAQUE IOHEXOL 350 MG/ML SOLN  Comparison: Chest radiograph 09/10/2012  Findings: There are no filling defects within the pulmonary arteries to  suggest acute pulmonary embolism.  No acute findings of the aorta or great vessels.  No pericardial fluid.  No mediastinal or hilar lymphadenopathy.  There is mild thickening of the esophagus which is diffuse in nature.  Review of the lung parenchyma demonstrates no pleural fluid or pneumothorax.  A 6 mm nodule in the lingula (image 55).  There is mild linear scarring at the lung bases.  Central airways are normal.  Limited view of the upper abdomen is unremarkable.  Limited view of the skeleton is unremarkable.  IMPRESSION:  1.  No evidence of acute pulmonary embolism. 2.   No acute pulmonary parenchymal findings. 3.  A 6 mm nodule within the lingula.  If the patient is at high risk for bronchogenic carcinoma, follow-up chest CT at 6-12 months is recommended.  If the patient is at low risk for bronchogenic carcinoma, follow-up chest CT at 12 months is  recommended.  This recommendation follows the consensus statement: Guidelines for Management of Small Pulmonary Nodules Detected on CT Scans: A Statement from the [employer] as published in Radiology 2005; 237:395-400.  4.  Diffuse thickening of the esophagus may represent esophagitis.   Original Report Authenticated By: Suzy Bouchard, M.D.      No diagnosis found.    MDM        The chart was scribed for me under my direct supervision.  I personally performed the history, physical, and medical decision making and all procedures in the evaluation of this patient.Maudry Diego, MD 09/10/12 [phone]

## 2012-09-12 LAB — URINE CULTURE: Colony Count: 4000

## 2012-09-23 IMAGING — US US CAROTID DUPLEX BILAT
1 series · 13 of 24 positions shown · non-contrast
Comparison: Carotid Doppler ultrasound - 09/10/2009

CLINICAL DATA: Right-sided carotid bruit; history of hypertension,
diabetes, smoking

BILATERAL CAROTID DUPLEX ULTRASOUND
TECHNIQUE: Gray scale imaging, color Doppler and duplex ultrasound
was performed of bilateral carotid and vertebral arteries in the
neck.

[Series 1: us carotid duplex bilat · 0.06mm/px · 13 of 69 slices shown]
[im 1/69]
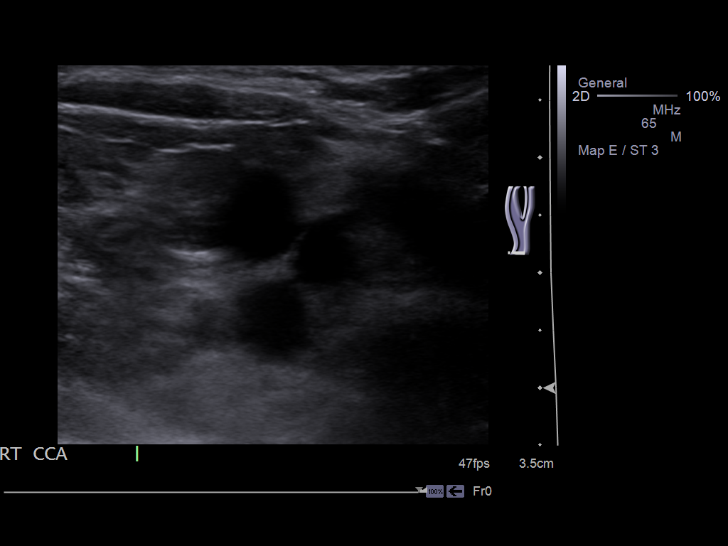
[im 6/69]
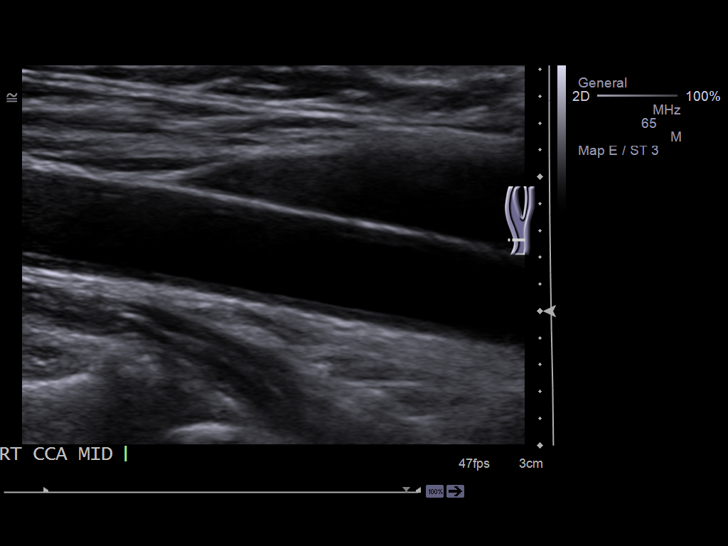
[im 12/69]
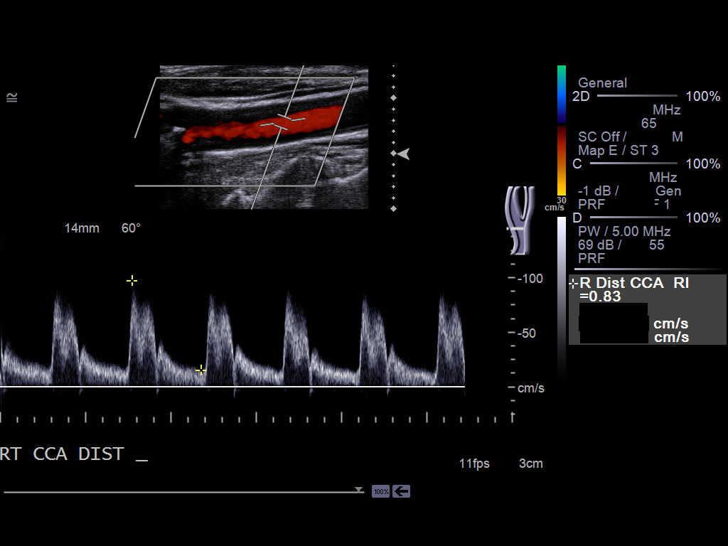
[im 18/69]
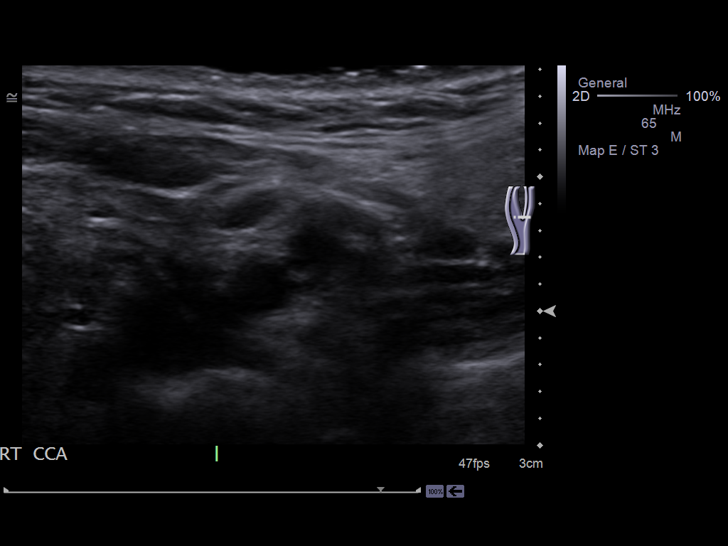
[im 24/69]
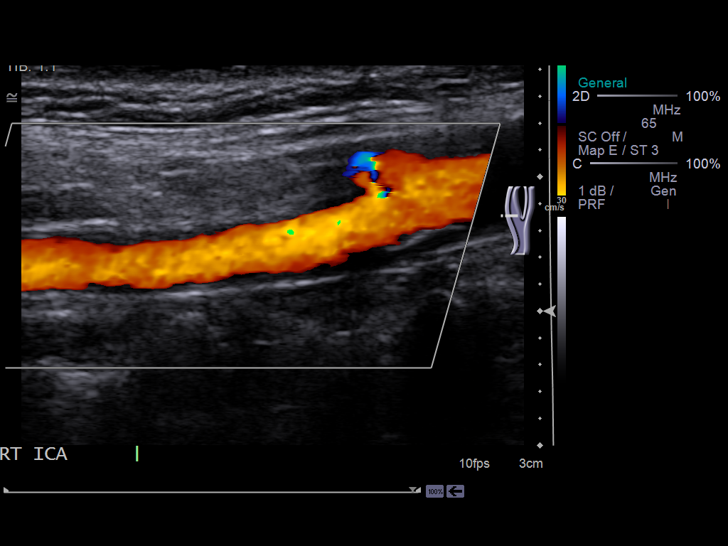
[im 30/69]
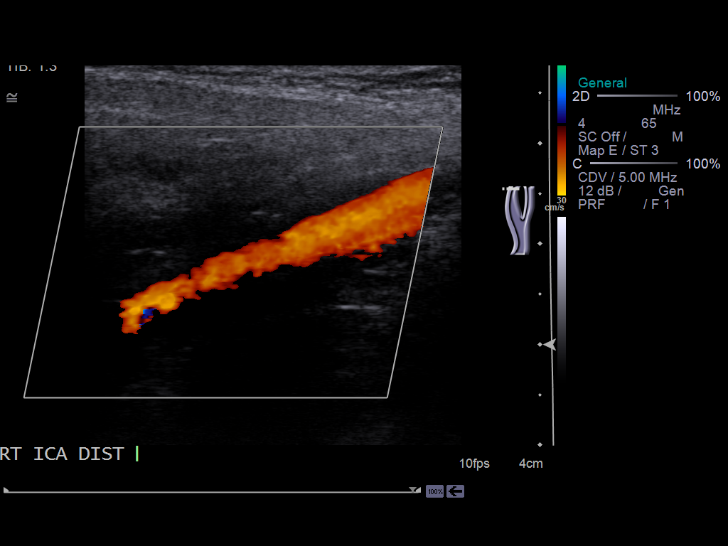
[im 36/69]
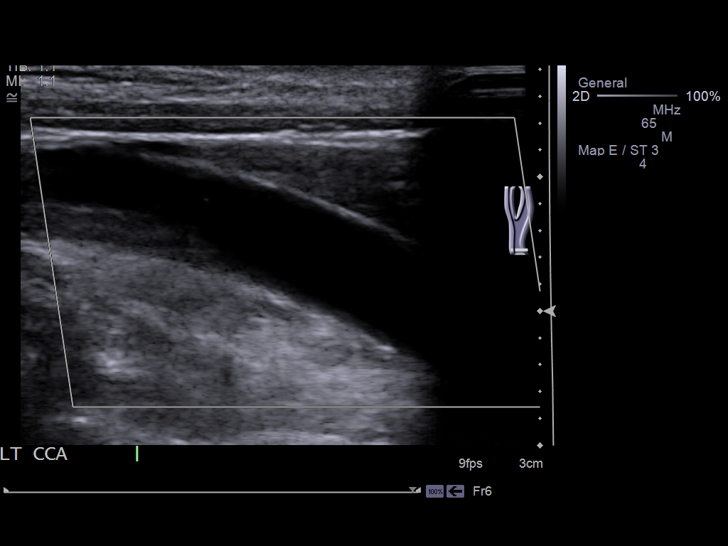
[im 39/69]
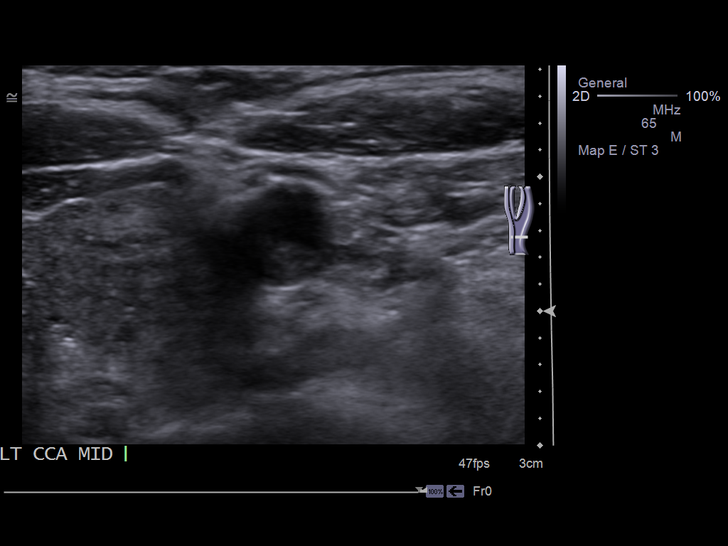
[im 45/69]
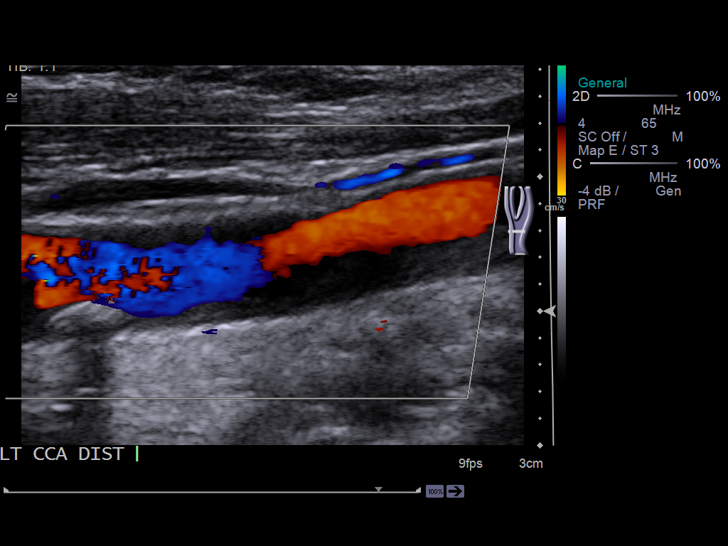
[im 51/69]
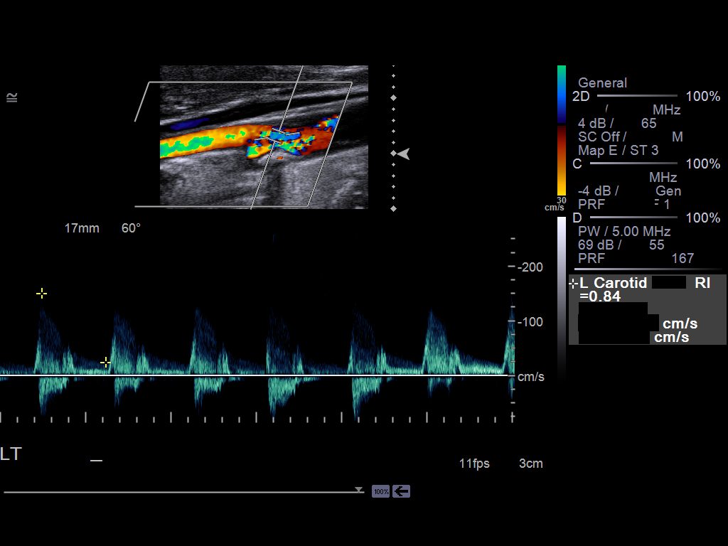
[im 57/69]
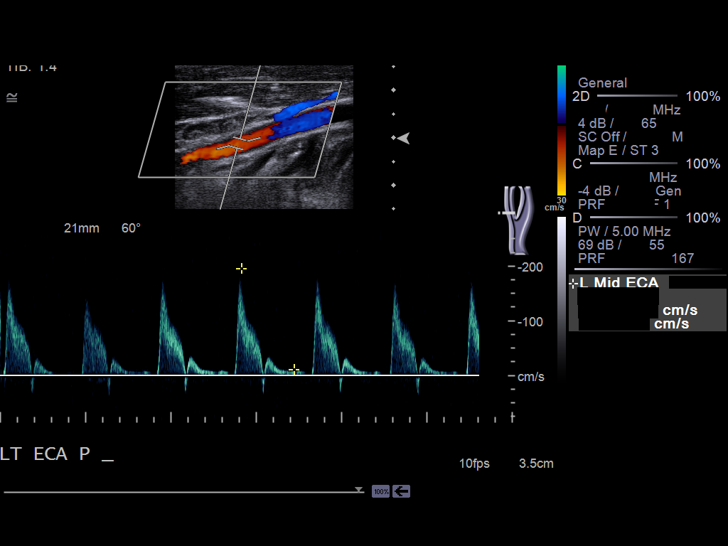
[im 63/69]
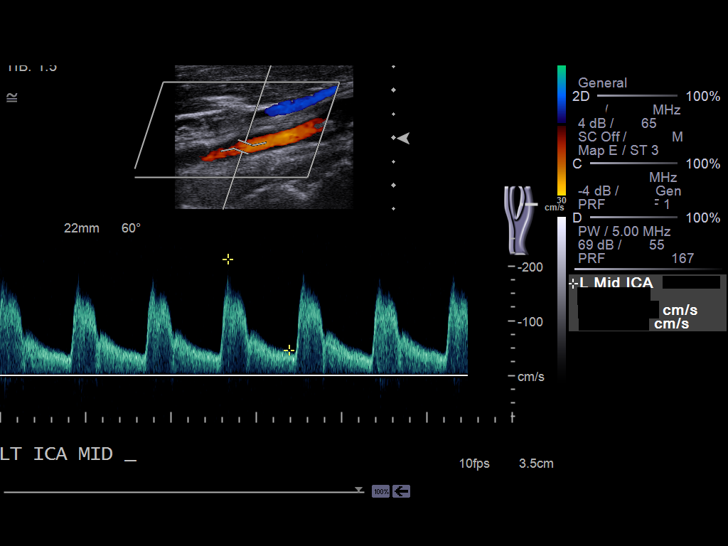
[im 69/69]
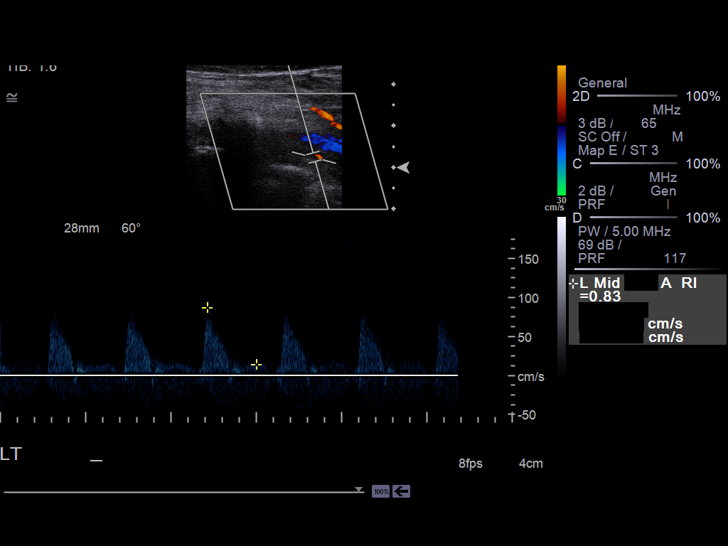

[13 of 24 positions shown; findings below may reference images not displayed]

Criteria:  Quantification of carotid stenosis is based on velocity
parameters that correlate the residual internal carotid diameter
with NASCET-based stenosis levels, using the diameter of the distal
internal carotid lumen as the denominator for stenosis measurement.

The following velocity measurements were obtained:

                 PEAK SYSTOLIC/END DIASTOLIC
RIGHT
ICA:                        111/18cm/sec
CCA:                        104/15cm/sec
SYSTOLIC ICA/CCA RATIO:
DIASTOLIC ICA/CCA RATIO:
ECA:                        10.7cm/sec

LEFT
ICA:                        214/47cm/sec
CCA:                        189/39cm/sec
SYSTOLIC ICA/CCA RATIO:
DIASTOLIC ICA/CCA RATIO:
ECA:                        169cm/sec
FINDINGS: RIGHT CAROTID ARTERY: There is grossly unchanged eccentric
echogenic plaque within the right carotid bulb extending to involve
the proximal aspect of the right internal carotid artery, not
resulting in elevated peak systolic velocities to suggest
hemodynamic significance.

RIGHT VERTEBRAL ARTERY:  Antegrade flow

LEFT CAROTID ARTERY: Interval worsening of predominantly
hypoechogenic plaque involving the mid aspect of the left common
carotid artery which results in increased peak systolic velocities
within the distal common carotid artery (191 cm/sec, image 47,
previously, 112 cm/sec. Grossly unchanged eccentric echogenic
plaque within the left carotid bulb with unchanged velocities
within the carotid bulb (151 cm/sec, image 52, previously,
approximately 150 cm/sec).  There is grossly unchanged intimal wall
thickening involving the proximal aspect of the left internal
carotid artery, however there is now elevated peak systolic
velocities within the ICA (proximal ICA - 171 cm/sec, image 61; mid
ICA - 213, image 64), compatible with 50-69% luminal narrowing.

LEFT VERTEBRAL ARTERY:  Antegrade flow
IMPRESSION: 1.  Elevated peak systolic velocities within the left internal
carotid artery correlate with 50-69% luminal narrowing.

2. Increased eccentric hypoechoic plaque involving the mid aspect
of the left common carotid artery.

3.  Grossly unchanged atherosclerotic plaque within the right
carotid bulb and proximal ICA, not resulting in elevated peak
systolic velocities to suggest hemodynamic significance.

## 2013-05-30 IMAGING — CT CT ANGIO CHEST
1 of 6 series · 5 of 36 positions shown · IV contrast (omnipaque)
Comparison: Chest radiograph 09/10/2012

CLINICAL DATA: Short of breath, history renal artery stenosis.
History of left breast cancer.  Concern for pulmonary embolism.

CT ANGIOGRAPHY CHEST
TECHNIQUE: Multidetector CT imaging of the chest using the
standard protocol during bolus administration of intravenous
contrast. Multiplanar reconstructed images including MIPs were
obtained and reviewed to evaluate the vascular anatomy.
Contrast: 100mL OMNIPAQUE IOHEXOL 350 MG/ML SOLN

[Series 5: pe 3.0 b40f · axial · 0.59mm/px · z∈[-424,-232]mm · 5 of 96 slices shown]
[im 16/96  lung]
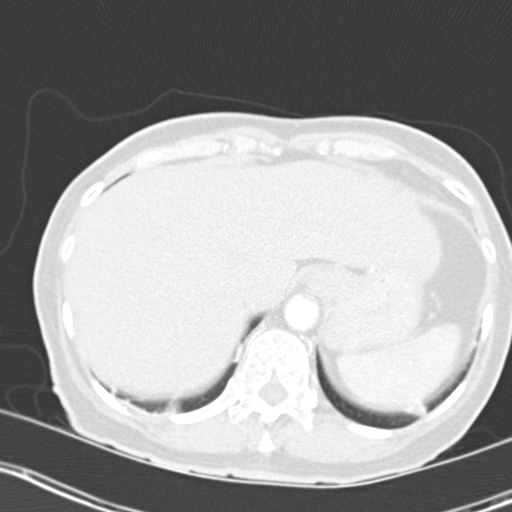
[im 32/96  mediastinal]
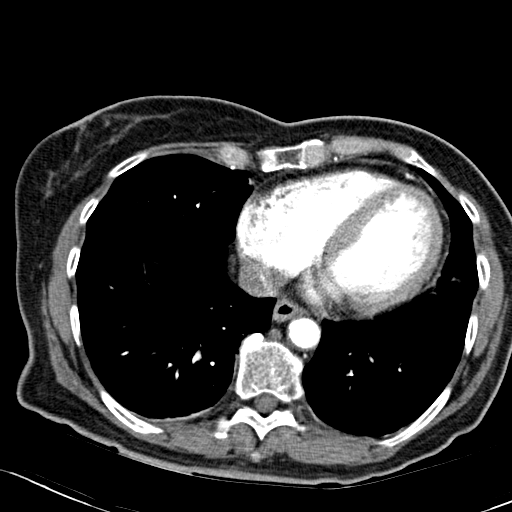
[im 48/96  lung]
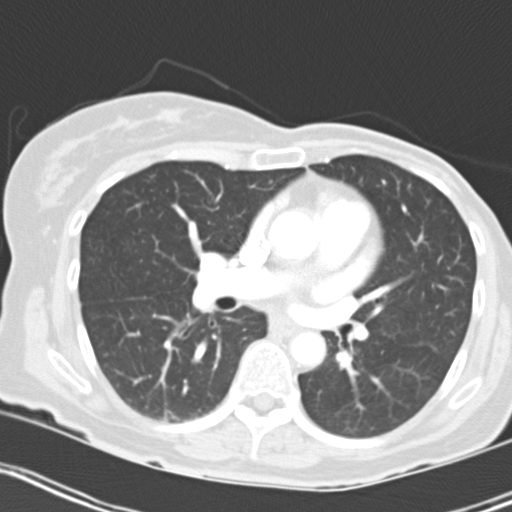
[im 64/96  mediastinal]
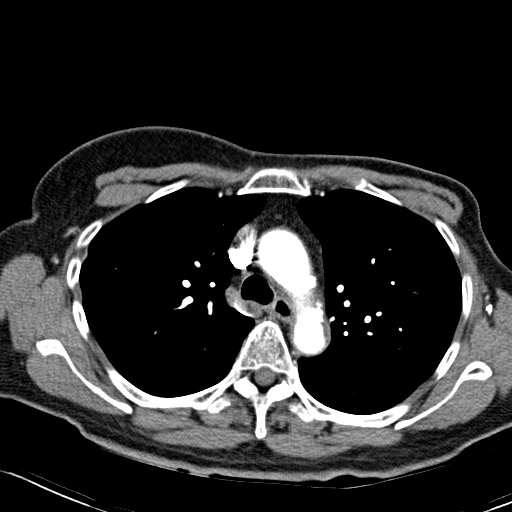
[im 80/96  lung]
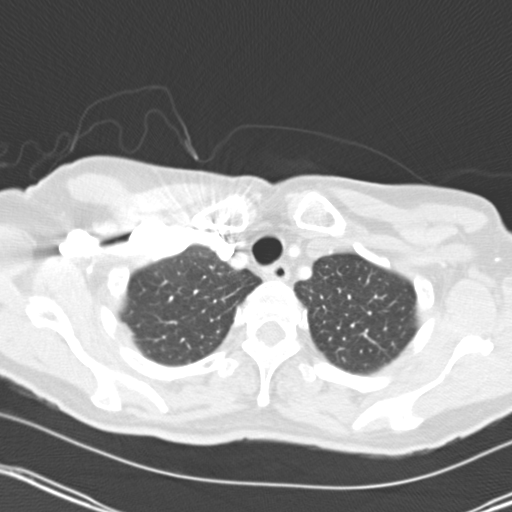

[5 of 36 positions shown; findings below may reference images not displayed]

FINDINGS: There are no filling defects within the pulmonary
arteries to  suggest acute pulmonary embolism.  No acute findings
of the aorta or great vessels.  No pericardial fluid.

No mediastinal or hilar lymphadenopathy.  There is mild thickening
of the esophagus which is diffuse in nature.

Review of the lung parenchyma demonstrates no pleural fluid or
pneumothorax.  A 6 mm nodule in the lingula (image 55).  There is
mild linear scarring at the lung bases.  Central airways are
normal.

Limited view of the upper abdomen is unremarkable.  Limited view of
the skeleton is unremarkable.
IMPRESSION: 1..  No evidence of acute pulmonary embolism.
2.   No acute pulmonary parenchymal findings.
3.  A 6 mm nodule within the lingula.  If the patient is at high
risk for bronchogenic carcinoma, follow-up chest CT at 6-12 months
is recommended.  If the patient is at low risk for bronchogenic
carcinoma, follow-up chest CT at 12 months is recommended.  This
recommendation follows the consensus statement: Guidelines for
Management of Small Pulmonary Nodules Detected on CT Scans: A
Statement from the [HOSPITAL] as published in Radiology

4.  Diffuse thickening of the esophagus may represent esophagitis.

## 2013-05-30 IMAGING — US US ABDOMEN COMPLETE
1 series · 13 of 25 positions shown · non-contrast
Comparison: None

CLINICAL DATA: History of abdominal pain, right flank pain,
diabetes, hypertension, and breast carcinoma.

ABDOMINAL ULTRASOUND COMPLETE

[Series 1: us abdomen complete · 0.25mm/px · 13 of 105 slices shown]
[im 1/105]
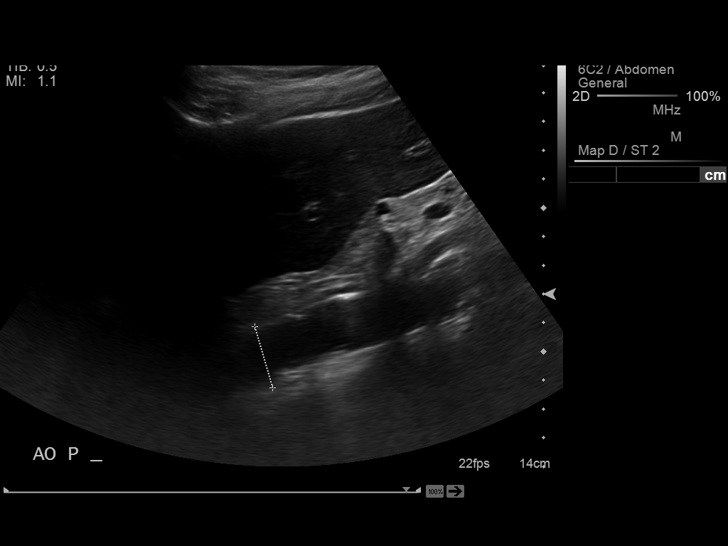
[im 9/105]
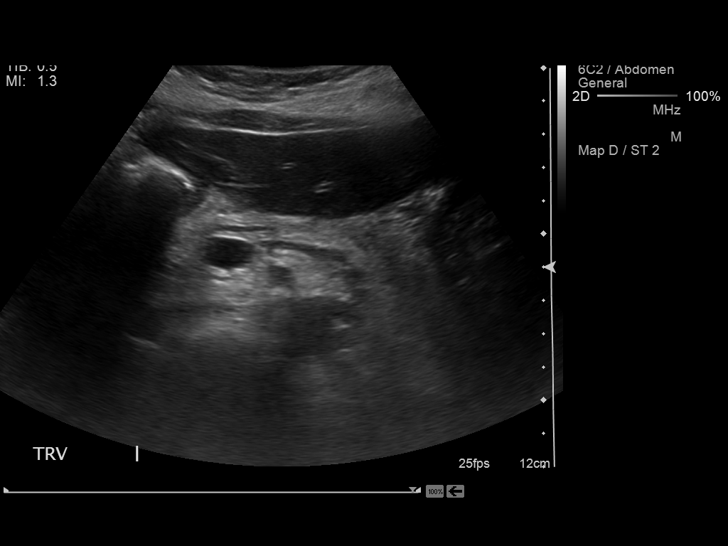
[im 18/105]
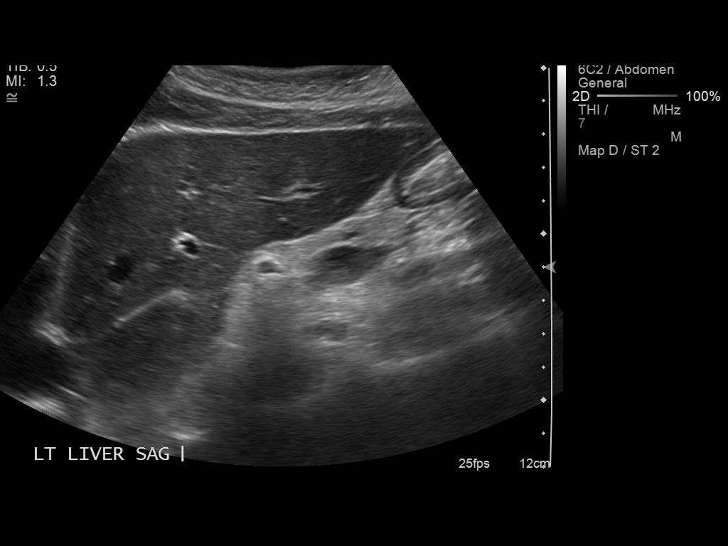
[im 27/105]
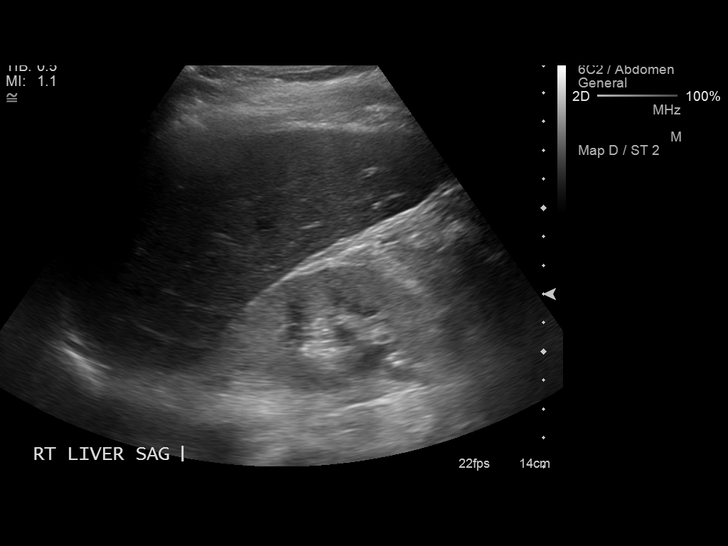
[im 35/105]
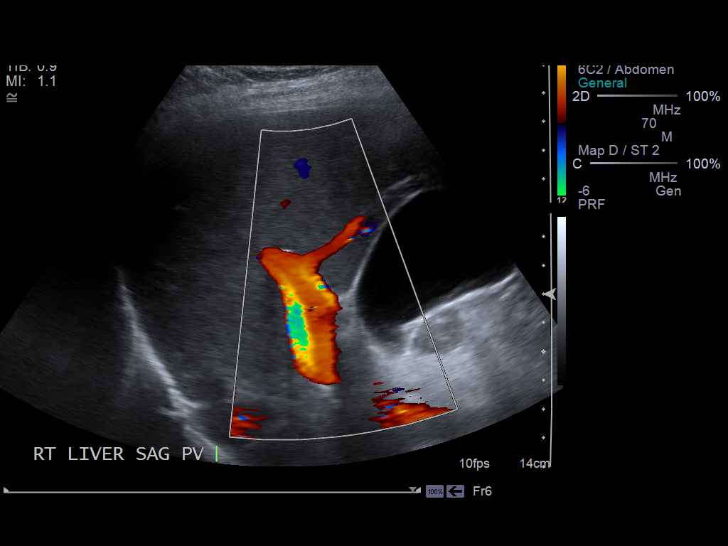
[im 44/105]
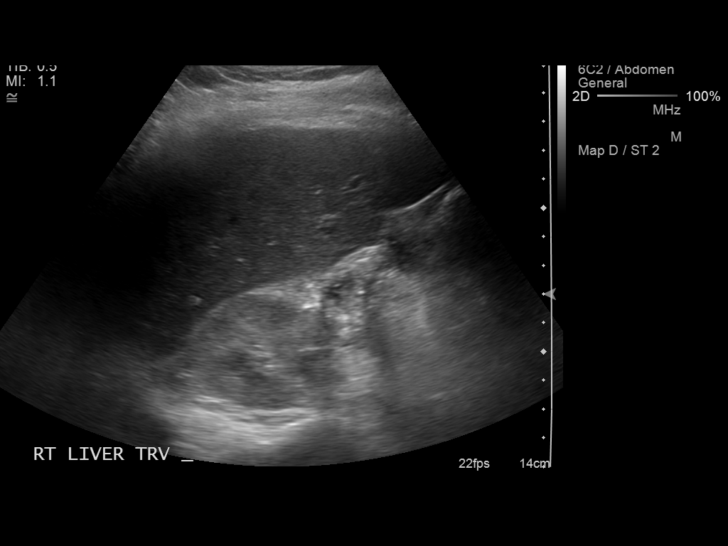
[im 53/105]
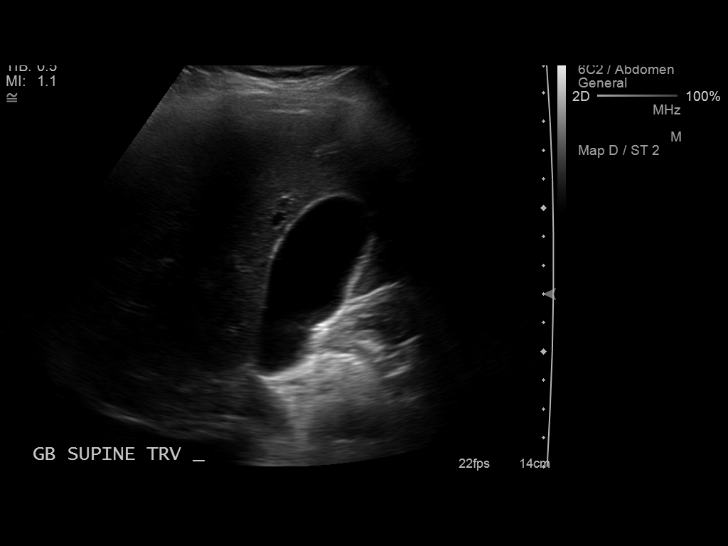
[im 61/105]
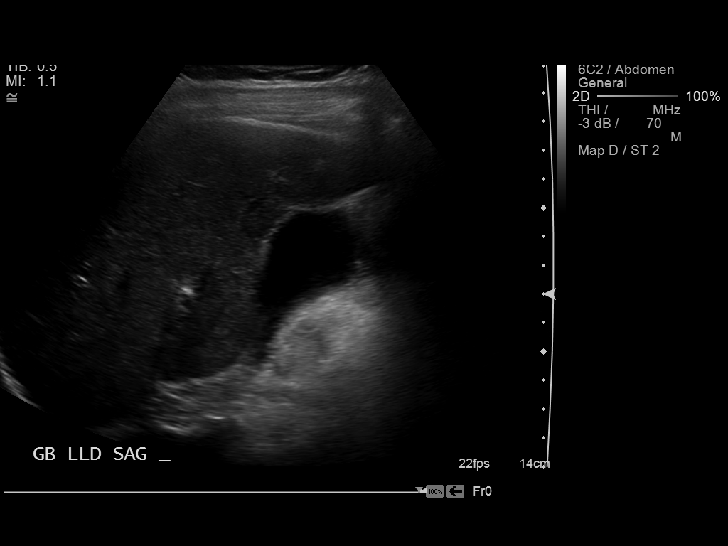
[im 70/105]
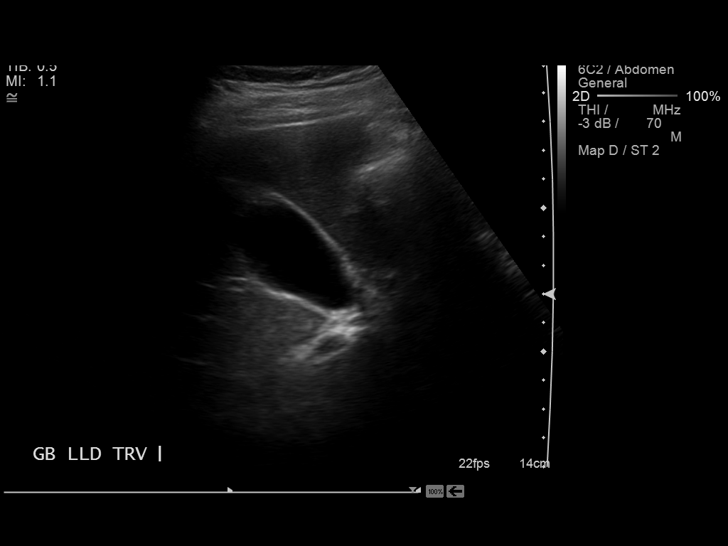
[im 79/105]
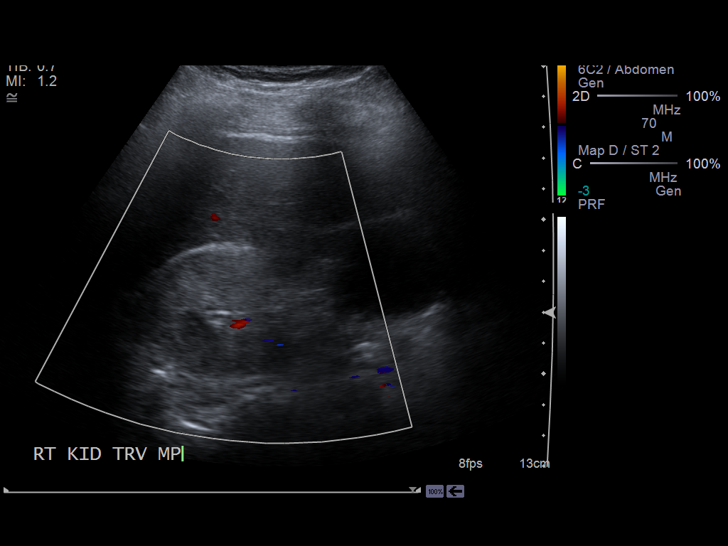
[im 87/105]
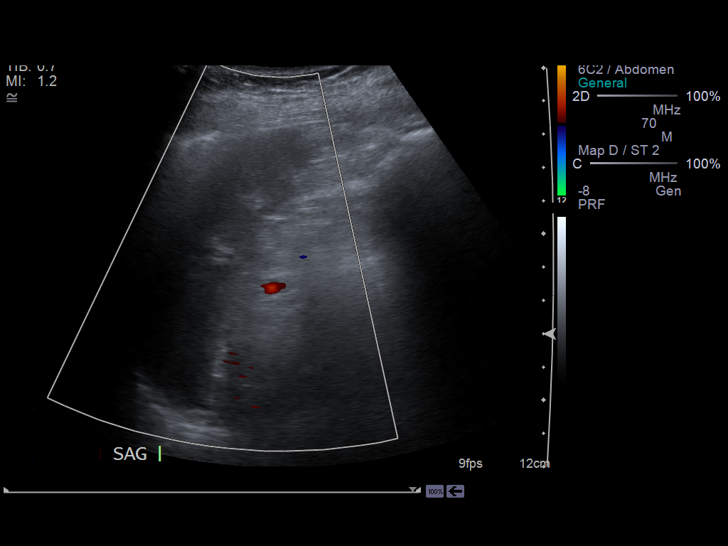
[im 96/105]
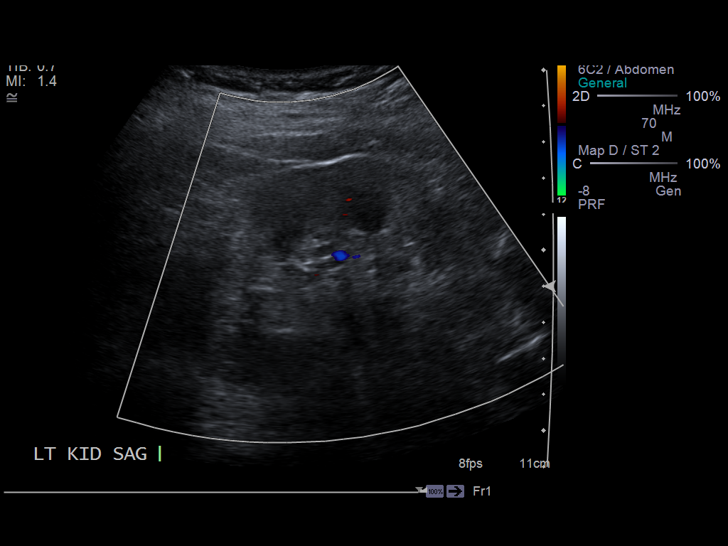
[im 105/105]
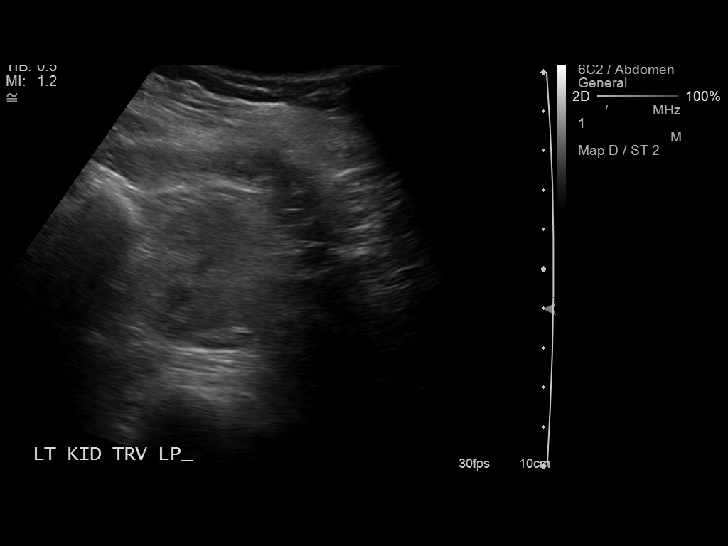

[13 of 25 positions shown; findings below may reference images not displayed]

FINDINGS: Gallbladder: No shadowing gallstones or echogenic sludge. No
gallbladder wall thickening or pericholecystic fluid. The
gallbladder wall thickness measured 2.0 mm. No sonographic Murphy's
sign according to the ultrasound technologist.

CBD: Normal in caliber measuring 5.0 mm. No choledocholithiasis is
evident.

Liver:  Normal size and echotexture without focal parenchymal
abnormality. Portal vein is patent with hepatopetal flow.

IVC:  Patent throughout its visualized course in the abdomen.

Pancreas:  Although the pancreas is difficult to visualize in its
entirety, no focal pancreatic abnormality is identified. Pancreatic
tail is incompletely visualized associated with bowel gas.

Spleen:  Normal size and echotexture without focal abnormality.
Splenic length is 6 cm.

Right kidney:  No hydronephrosis.  Well-preserved cortex.  Normal
parenchymal echotexture without focal abnormalities.  Right renal
length is 11.1 cm.

Left kidney:  No hydronephrosis.  Well-preserved cortex.  Normal
parenchymal echotexture without focal abnormalities.  Left renal
length is 9.5 cm.

Aorta:  Maximum diameter is 2.2  cm.  No aneurysm is evident.
Ectasia and atherosclerotic plaquing are present.

Ascites:  None.
IMPRESSION: No acute abdominal abnormality was identified.Ectasia
and atherosclerotic plaquing of abdominal aorta are present.  No
aneurysm is seen. History of right flank pain.  No hydronephrosis
was evident.

## 2013-06-28 ENCOUNTER — Other Ambulatory Visit (HOSPITAL_COMMUNITY): Payer: Self-pay | Admitting: Nephrology

## 2013-06-28 DIAGNOSIS — N038 Chronic nephritic syndrome with other morphologic changes: Secondary | ICD-10-CM

## 2013-06-29 ENCOUNTER — Encounter (HOSPITAL_COMMUNITY): Payer: Self-pay | Admitting: Pharmacy Technician

## 2013-06-29 ENCOUNTER — Other Ambulatory Visit: Payer: Self-pay | Admitting: Radiology

## 2013-06-30 ENCOUNTER — Encounter (HOSPITAL_COMMUNITY): Payer: Self-pay

## 2013-06-30 ENCOUNTER — Ambulatory Visit (HOSPITAL_COMMUNITY)
Admission: RE | Admit: 2013-06-30 | Discharge: 2013-06-30 | Disposition: A | Discharge status: Home / Self Care | Payer: Medicare Other | Source: Ambulatory Visit | Attending: Nephrology | Admitting: Nephrology

## 2013-06-30 DIAGNOSIS — E785 Hyperlipidemia, unspecified: Secondary | ICD-10-CM | POA: Insufficient documentation

## 2013-06-30 DIAGNOSIS — E119 Type 2 diabetes mellitus without complications: Secondary | ICD-10-CM | POA: Insufficient documentation

## 2013-06-30 DIAGNOSIS — N018 Rapidly progressive nephritic syndrome with other morphologic changes: Secondary | ICD-10-CM | POA: Insufficient documentation

## 2013-06-30 DIAGNOSIS — K219 Gastro-esophageal reflux disease without esophagitis: Secondary | ICD-10-CM | POA: Insufficient documentation

## 2013-06-30 DIAGNOSIS — N189 Chronic kidney disease, unspecified: Secondary | ICD-10-CM | POA: Insufficient documentation

## 2013-06-30 DIAGNOSIS — N038 Chronic nephritic syndrome with other morphologic changes: Secondary | ICD-10-CM

## 2013-06-30 DIAGNOSIS — Z853 Personal history of malignant neoplasm of breast: Secondary | ICD-10-CM | POA: Insufficient documentation

## 2013-06-30 DIAGNOSIS — I129 Hypertensive chronic kidney disease with stage 1 through stage 4 chronic kidney disease, or unspecified chronic kidney disease: Secondary | ICD-10-CM | POA: Insufficient documentation

## 2013-06-30 DIAGNOSIS — Z87891 Personal history of nicotine dependence: Secondary | ICD-10-CM | POA: Insufficient documentation

## 2013-06-30 LAB — CBC
HCT: 33.3 % — ABNORMAL LOW (ref 36.0–46.0)
Hemoglobin: 10.9 g/dL — ABNORMAL LOW (ref 12.0–15.0)
MCHC: 32.7 g/dL (ref 30.0–36.0)
MCV: 88.1 fL (ref 78.0–100.0)
RDW: 12.9 % (ref 11.5–15.5)
WBC: 6.8 10*3/uL (ref 4.0–10.5)

## 2013-06-30 LAB — PROTIME-INR: INR: 0.97 (ref 0.00–1.49)

## 2013-06-30 MED ORDER — FENTANYL CITRATE 0.05 MG/ML IJ SOLN
INTRAMUSCULAR | Status: AC | PRN
  Administered 2013-06-30: 25 ug via INTRAVENOUS
  Administered 2013-06-30: 50 ug via INTRAVENOUS
  Administered 2013-06-30: 25 ug via INTRAVENOUS

## 2013-06-30 MED ORDER — MIDAZOLAM HCL 2 MG/2ML IJ SOLN
INTRAMUSCULAR | Status: AC
  Filled 2013-06-30: qty 4

## 2013-06-30 MED ORDER — MIDAZOLAM HCL 2 MG/2ML IJ SOLN
INTRAMUSCULAR | Status: AC | PRN
  Administered 2013-06-30: 0.5 mg via INTRAVENOUS
  Administered 2013-06-30: 1 mg via INTRAVENOUS
  Administered 2013-06-30: 0.5 mg via INTRAVENOUS

## 2013-06-30 MED ORDER — FENTANYL CITRATE 0.05 MG/ML IJ SOLN
INTRAMUSCULAR | Status: AC
  Filled 2013-06-30: qty 4

## 2013-06-30 MED ORDER — SODIUM CHLORIDE 0.9 % IV SOLN
INTRAVENOUS | Status: DC
  Administered 2013-06-30: 20 mL/h via INTRAVENOUS

## 2013-06-30 NOTE — Procedures (Signed)
US guided biopsy of right kidney lower pole.  3 cores obtained.  No immediate complication.

## 2013-06-30 NOTE — Progress Notes (Signed)
Dr Pearson Grippe in to talk to pt and family about the results of test and plan of care.

## 2013-06-30 NOTE — Progress Notes (Signed)
Pt voided 400cc of yellow urine

## 2013-06-30 NOTE — Progress Notes (Signed)
Discharge instruction given per MD order.  Pt and CG able to verbalized understanding of instructions.  Pt denies any pain at this time.  Pt to car via wheelchair.

## 2013-06-30 NOTE — ED Notes (Signed)
Blood drop on bandaid from initial dressing

## 2013-06-30 NOTE — ED Notes (Signed)
Labs WNL.  Dr Anselm Pancoast called to proceed with procedure.  Pt and husband aware.

## 2013-06-30 NOTE — ED Notes (Signed)
O2 resumed, sat down to 80'soff o2.  Back to 99% on 2 L/[place]

## 2013-06-30 NOTE — ED Notes (Signed)
Patient and husband informed that we are waiting on lab results to proceed.  Verbalize understanding.  Comfortable. VSS.

## 2013-06-30 NOTE — ED Notes (Signed)
Very alert speaking with husband.  O2 d/c'd again.

## 2013-06-30 NOTE — H&P (Signed)
Colleen Parks is an 78 y.o. female.   Chief Complaint: Hx proteinuria; hematuria; worsening Creatinine; Proliferative glomerulonephritis Followed x 1 yr by primary MD- referred to Dr Joelyn Oms (nephrologist) Scheduled now for random renal biopsy Pt has had previous bx 01/2013 at [facility] hospital--Non diagnostic  HPI: HLD; HTN; Breast Ca; RAS; DM; GERD  Past Medical History  Diagnosis Date  . Hypercholesterolemia   . RAS (renal artery stenosis)   . Subclavian arterial stenosis   . HTN (hypertension)   . Diabetes mellitus   . GERD (gastroesophageal reflux disease)   . Anxiety   . Cancer     breast    Past Surgical History  Procedure Laterality Date  . Left breast mastectomy with chemo  1995   . Biopsy thyroid    . Cataract extraction w/phaco  12/18/2011    Procedure: CATARACT EXTRACTION PHACO AND INTRAOCULAR LENS PLACEMENT (IOC);  Surgeon: Tonny Branch, MD;  Location: [facility];  Service: Ophthalmology;  Laterality: Right;  CDE:17.77  . Cataract extraction w/phaco  01/26/2012    Procedure: CATARACT EXTRACTION PHACO AND INTRAOCULAR LENS PLACEMENT (IOC);  Surgeon: Tonny Branch, MD;  Location: [facility];  Service: Ophthalmology;  Laterality: Left;  CDE 15.23    Family History  Problem Relation Age of Onset  . Coronary artery disease      positive for premature- on fathers side  . Anesthesia problems Neg Hx   . Hypotension Neg Hx   . Malignant hyperthermia Neg Hx   . Pseudochol deficiency Neg Hx    Social History:  reports that she quit smoking about 54 years ago. Her smoking use included Cigarettes. She has a 2 pack-year smoking history. She does not have any smokeless tobacco history on file. She reports that she does not drink alcohol or use illicit drugs.  Allergies:  Allergies  Allergen Reactions  . Codeine Nausea And Vomiting     (Not in a hospital admission)  No results found for this or any previous visit (from the past 48 hour(s)). No results found.  Review of Systems   Constitutional: Negative for fever and weight loss.  Respiratory: Negative for cough and shortness of breath.   Cardiovascular: Negative for chest pain.  Gastrointestinal: Negative for nausea, vomiting and abdominal pain.  Neurological: Negative for dizziness, weakness and headaches.    Blood pressure 171/51, pulse 66, temperature 97.5 F (36.4 C), temperature source Oral, resp. rate 18, height 5\' 6"  (1.676 m), weight 143 lb (64.864 kg), SpO2 100.00%. Physical Exam  Constitutional: She is oriented to person, place, and time. She appears well-nourished.  Cardiovascular: Normal rate and regular rhythm.   No murmur heard. Respiratory: Effort normal and breath sounds normal. She has no wheezes.  GI: Soft. Bowel sounds are normal. There is no tenderness.  Musculoskeletal: Normal range of motion.  Neurological: She is alert and oriented to person, place, and time. Coordination normal.  Skin: Skin is warm and dry.  Psychiatric: She has a normal mood and affect. Her behavior is normal. Judgment and thought content normal.     Assessment/Plan Worsening renal fxn; proteinuria; hematuria x 1 yr Proliferative glomerulonephritis Previous renal bx 01/2013 at [facility]- non diagnostic Referred recently to  Nephrologist Dr Joelyn Oms Scheduled now for random renal biopsy Pt aware of procedure benefits and risks and agreeable to proceed Consent signed and in chart  Hayward A 06/30/2013, 9:38 AM

## 2013-06-30 NOTE — ED Notes (Signed)
Lab called to check on coags, report they will be finished soon.

## 2013-06-30 NOTE — ED Notes (Signed)
O2 2l/[place] started 

## 2013-06-30 NOTE — ED Notes (Signed)
Bandaid blood spot unchanged.

## 2013-06-30 NOTE — ED Notes (Signed)
Moved to nurses station on O2.  Will continue to monitor o2 sat.  Easily arroused.

## 2013-06-30 NOTE — ED Notes (Signed)
O2 d/c'd 

## 2013-06-30 NOTE — ED Notes (Signed)
O2 resumed again.  Pt alert but sat down to 86 unless prompted to take deep breath.  Given po's and husband at bedside.  Short stay called and updated.

## 2013-07-01 ENCOUNTER — Other Ambulatory Visit (HOSPITAL_COMMUNITY): Payer: Medicare Other

## 2013-07-04 ENCOUNTER — Ambulatory Visit
Admission: RE | Admit: 2013-07-04 | Discharge: 2013-07-04 | Disposition: A | Discharge status: Home / Self Care | Payer: Medicare Other | Source: Ambulatory Visit | Attending: Nephrology | Admitting: Nephrology

## 2013-07-04 ENCOUNTER — Other Ambulatory Visit: Payer: Self-pay | Admitting: Nephrology

## 2013-07-04 DIAGNOSIS — I776 Arteritis, unspecified: Secondary | ICD-10-CM

## 2013-07-07 ENCOUNTER — Other Ambulatory Visit (HOSPITAL_COMMUNITY): Payer: Self-pay | Admitting: Nephrology

## 2013-07-07 ENCOUNTER — Encounter (HOSPITAL_COMMUNITY)
Admission: RE | Admit: 2013-07-07 | Discharge: 2013-07-07 | Disposition: A | Discharge status: Home / Self Care | Payer: Medicare Other | Source: Ambulatory Visit | Attending: Nephrology | Admitting: Nephrology

## 2013-07-07 ENCOUNTER — Encounter (HOSPITAL_COMMUNITY): Payer: Self-pay

## 2013-07-07 DIAGNOSIS — I776 Arteritis, unspecified: Secondary | ICD-10-CM | POA: Insufficient documentation

## 2013-07-07 HISTORY — DX: Antineutrophilic cytoplasmic antibody (ANCA) vasculitis: I77.82

## 2013-07-07 HISTORY — DX: Unspecified nephritic syndrome with unspecified morphologic changes: N05.9

## 2013-07-07 HISTORY — DX: Arteritis, unspecified: I77.6

## 2013-07-07 LAB — CBC WITH DIFFERENTIAL/PLATELET
Basophils Absolute: 0 10*3/uL (ref 0.0–0.1)
Eosinophils Relative: 0 % (ref 0–5)
HCT: 30.2 % — ABNORMAL LOW (ref 36.0–46.0)
Hemoglobin: 10 g/dL — ABNORMAL LOW (ref 12.0–15.0)
Lymphocytes Relative: 14 % (ref 12–46)
MCV: 88 fL (ref 78.0–100.0)
Monocytes Absolute: 0.7 10*3/uL (ref 0.1–1.0)
Monocytes Relative: 5 % (ref 3–12)
Neutro Abs: 11.2 10*3/uL — ABNORMAL HIGH (ref 1.7–7.7)
Platelets: 269 10*3/uL (ref 150–400)
WBC: 13.8 10*3/uL — ABNORMAL HIGH (ref 4.0–10.5)

## 2013-07-07 MED ORDER — DEXAMETHASONE 6 MG PO TABS
10.0000 mg | ORAL_TABLET | Freq: Once | ORAL | Status: AC
  Administered 2013-07-07: 10 mg via ORAL
  Filled 2013-07-07: qty 1

## 2013-07-07 MED ORDER — SODIUM CHLORIDE 0.9 % IV SOLN
Freq: Once | INTRAVENOUS | Status: AC
  Administered 2013-07-07: 09:00:00 via INTRAVENOUS
  Filled 2013-07-07: qty 1000

## 2013-07-07 MED ORDER — ONDANSETRON 8 MG/NS 50 ML IVPB
8.0000 mg | Freq: Once | INTRAVENOUS | Status: AC
  Administered 2013-07-07: 8 mg via INTRAVENOUS
  Filled 2013-07-07: qty 8

## 2013-07-07 MED ORDER — SODIUM CHLORIDE 0.9 % IV SOLN
Freq: Once | INTRAVENOUS | Status: AC
  Administered 2013-07-07: 500 mL via INTRAVENOUS

## 2013-07-07 MED ORDER — SODIUM CHLORIDE 0.9 % IV SOLN: Freq: Once | INTRAVENOUS | Status: DC

## 2013-07-07 MED ORDER — DEXTROSE 5 % IV SOLN
Freq: Once | INTRAVENOUS | Status: AC
  Administered 2013-07-07: 12:00:00 via INTRAVENOUS
  Filled 2013-07-07: qty 100

## 2013-07-07 NOTE — Progress Notes (Signed)
Pt here for 1st Cyclophosphamide infusion. CBC with diff report called to Dr Joelyn Oms with WBC of 13.8. Pt voices no c/o  Temp 97.8 OK to proceed with infusion

## 2013-07-07 NOTE — Progress Notes (Signed)
Uneventful infusion of 1st Cyclophophamide . Pt states she will be going to [facility] in [place] to pick up the prescription for the "antinausea" medication . She is scheduled to return on Nov 4,5,6 for solumedrol infusion

## 2013-07-08 ENCOUNTER — Other Ambulatory Visit (HOSPITAL_COMMUNITY): Payer: Self-pay | Admitting: Family Medicine

## 2013-07-08 DIAGNOSIS — Z139 Encounter for screening, unspecified: Secondary | ICD-10-CM

## 2013-07-19 ENCOUNTER — Encounter (HOSPITAL_COMMUNITY): Payer: Self-pay

## 2013-07-19 ENCOUNTER — Ambulatory Visit (HOSPITAL_COMMUNITY)
Admission: RE | Admit: 2013-07-19 | Discharge: 2013-07-19 | Disposition: A | Discharge status: Home / Self Care | Payer: Medicare Other | Source: Ambulatory Visit | Attending: Nephrology | Admitting: Nephrology

## 2013-07-19 ENCOUNTER — Other Ambulatory Visit (HOSPITAL_COMMUNITY): Payer: Self-pay | Admitting: Nephrology

## 2013-07-19 DIAGNOSIS — I776 Arteritis, unspecified: Secondary | ICD-10-CM | POA: Insufficient documentation

## 2013-07-19 MED ORDER — SODIUM CHLORIDE 0.9 % IV SOLN
Freq: Every day | INTRAVENOUS | Status: DC
  Administered 2013-07-19: 20 mL/h via INTRAVENOUS

## 2013-07-19 MED ORDER — SODIUM CHLORIDE 0.9 % IV SOLN
500.0000 mg | Freq: Every day | INTRAVENOUS | Status: DC
  Administered 2013-07-19: 500 mg via INTRAVENOUS
  Filled 2013-07-19 (×2): qty 4

## 2013-07-20 ENCOUNTER — Encounter (HOSPITAL_COMMUNITY)
Admission: RE | Admit: 2013-07-20 | Discharge: 2013-07-20 | Disposition: A | Discharge status: Home / Self Care | Payer: Medicare Other | Source: Ambulatory Visit | Attending: Nephrology | Admitting: Nephrology

## 2013-07-20 ENCOUNTER — Encounter (HOSPITAL_COMMUNITY): Payer: Self-pay

## 2013-07-20 DIAGNOSIS — I776 Arteritis, unspecified: Secondary | ICD-10-CM | POA: Insufficient documentation

## 2013-07-20 MED ORDER — SODIUM CHLORIDE 0.9 % IV SOLN
Freq: Every day | INTRAVENOUS | Status: DC
  Administered 2013-07-20: 12:00:00 via INTRAVENOUS

## 2013-07-20 MED ORDER — SODIUM CHLORIDE 0.9 % IV SOLN
500.0000 mg | Freq: Every day | INTRAVENOUS | Status: DC
  Administered 2013-07-20: 500 mg via INTRAVENOUS
  Filled 2013-07-20 (×2): qty 4

## 2013-07-21 ENCOUNTER — Encounter (HOSPITAL_COMMUNITY): Payer: Self-pay

## 2013-07-21 ENCOUNTER — Encounter (HOSPITAL_COMMUNITY)
Admission: RE | Admit: 2013-07-21 | Discharge: 2013-07-21 | Disposition: A | Discharge status: Home / Self Care | Payer: Medicare Other | Source: Ambulatory Visit | Attending: Nephrology | Admitting: Nephrology

## 2013-07-21 MED ORDER — SODIUM CHLORIDE 0.9 % IV SOLN
500.0000 mg | Freq: Every day | INTRAVENOUS | Status: AC
  Administered 2013-07-21: 500 mg via INTRAVENOUS
  Filled 2013-07-21: qty 4

## 2013-07-21 MED ORDER — SODIUM CHLORIDE 0.9 % IV SOLN
Freq: Every day | INTRAVENOUS | Status: AC
  Administered 2013-07-21: 08:00:00 via INTRAVENOUS

## 2013-07-22 ENCOUNTER — Encounter (HOSPITAL_COMMUNITY): Payer: Self-pay

## 2013-08-01 ENCOUNTER — Ambulatory Visit (INDEPENDENT_AMBULATORY_CARE_PROVIDER_SITE_OTHER): Payer: Medicare Other | Admitting: Cardiovascular Disease

## 2013-08-01 ENCOUNTER — Encounter: Payer: Self-pay | Admitting: Cardiovascular Disease

## 2013-08-01 VITALS — BP 170/72 | HR 77 | Ht 65.0 in | Wt 142.0 lb

## 2013-08-01 DIAGNOSIS — E782 Mixed hyperlipidemia: Secondary | ICD-10-CM

## 2013-08-01 DIAGNOSIS — N059 Unspecified nephritic syndrome with unspecified morphologic changes: Secondary | ICD-10-CM

## 2013-08-01 DIAGNOSIS — I10 Essential (primary) hypertension: Secondary | ICD-10-CM

## 2013-08-01 DIAGNOSIS — E119 Type 2 diabetes mellitus without complications: Secondary | ICD-10-CM

## 2013-08-01 NOTE — Patient Instructions (Addendum)
Your physician recommends that you schedule a follow-up appointment in: ONE YEAR 

## 2013-08-01 NOTE — Assessment & Plan Note (Signed)
Discussed low carb diet.  Target hemoglobin A1c is 6.5 or less.  Continue current medications.  

## 2013-08-01 NOTE — Progress Notes (Signed)
Patient ID: Colleen Parks, female   DOB: [birth year], 78 y.o.   MRN: [id number] Labrenda is seen today for F/U of elevated lipids, HTN. She has previously been seen by Helayne Seminole from [facility]. There was a question of carotid disease but duplex done 12/10 showed no significatn disease. CRF's include elevated lipid and HTN which is finally Rx. She also had a HbA1c of 7.3 and has begun Rx for type 2 DM. She is active in the garden and bowls She is not having any sscp, palpitations, PND, orthopnea or edema. She had a normal myouve in 2008 with no documented CAD Looking forward to going to [place] to bowl. 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  On chronic prednisone Seeing [facility]     ROS: Denies fever, malais, weight loss, blurry vision, decreased visual acuity, cough, sputum, SOB, hemoptysis, pleuritic pain, palpitaitons, heartburn, abdominal pain, melena, lower extremity edema, claudication, or rash.  All other systems reviewed and negative  General: Affect appropriate Healthy:  appears stated age 78: normal Neck supple with no adenopathy JVP normal no bruits no thyromegaly Lungs clear with no wheezing and good diaphragmatic motion Heart:  S1/S2 no murmur, no rub, gallop or click PMI normal Abdomen: benighn, BS positve, no tenderness, no AAA no bruit.  No HSM or HJR Distal pulses intact with no bruits No edema Neuro non-focal Skin warm and dry No muscular weakness   Current Outpatient Prescriptions  Medication Sig Dispense Refill  . gemfibrozil (LOPID) 600 MG tablet Take 600 mg by mouth 2 (two) times daily before a meal.      . LORazepam (ATIVAN) 0.5 MG tablet Take 0.5 mg by mouth as needed. For traveling anxiety      .  lovastatin (MEVACOR) 20 MG tablet Take 20 mg by mouth daily.      . ondansetron (ZOFRAN) 8 MG tablet       . predniSONE (DELTASONE) 20 MG tablet Take 20 mg by mouth daily. 3 tablets daily      . ranitidine (ZANTAC) 150 MG tablet Take 150 mg by mouth daily as needed for heartburn. For heartburn       No current facility-administered medications for this visit.    Allergies  Codeine  Electrocardiogram:  SR rate 74 normal ECG   Assessment and Plan

## 2013-08-01 NOTE — Assessment & Plan Note (Signed)
F/U [facility] On roids. Has had cyclophosphamide Rx  Supposed to start additional outpatient Rx  Follow Cr

## 2013-08-01 NOTE — Assessment & Plan Note (Signed)
Rx limited by side effects. Labs with Karie Kirks q 4 months per patient Continue current Rx

## 2013-08-01 NOTE — Assessment & Plan Note (Signed)
Currently not on Rx  F/U renal would add ACE or calcium blocker. Last Cr in Epic is 1.28 but I suspect there is more recent one with nephrology Likely worse with steroid Rx

## 2013-08-22 ENCOUNTER — Ambulatory Visit (HOSPITAL_COMMUNITY)
Admission: RE | Admit: 2013-08-22 | Discharge: 2013-08-22 | Disposition: A | Discharge status: Home / Self Care | Payer: Medicare Other | Source: Ambulatory Visit | Attending: Family Medicine | Admitting: Family Medicine

## 2013-08-22 ENCOUNTER — Other Ambulatory Visit (HOSPITAL_COMMUNITY): Payer: Self-pay | Admitting: Family Medicine

## 2013-08-22 DIAGNOSIS — Z1231 Encounter for screening mammogram for malignant neoplasm of breast: Secondary | ICD-10-CM | POA: Insufficient documentation

## 2013-08-22 DIAGNOSIS — Z139 Encounter for screening, unspecified: Secondary | ICD-10-CM

## 2013-11-01 ENCOUNTER — Other Ambulatory Visit (HOSPITAL_COMMUNITY): Payer: Self-pay | Admitting: Nephrology

## 2013-11-01 ENCOUNTER — Encounter (HOSPITAL_COMMUNITY)
Admission: RE | Admit: 2013-11-01 | Discharge: 2013-11-01 | Disposition: A | Discharge status: Home / Self Care | Payer: Medicare Other | Source: Ambulatory Visit | Attending: Nephrology | Admitting: Nephrology

## 2013-11-01 ENCOUNTER — Encounter (HOSPITAL_COMMUNITY): Payer: Self-pay

## 2013-11-01 DIAGNOSIS — I776 Arteritis, unspecified: Secondary | ICD-10-CM | POA: Insufficient documentation

## 2013-11-01 MED ORDER — ACETAMINOPHEN 325 MG PO TABS
650.0000 mg | ORAL_TABLET | ORAL | Status: DC
  Administered 2013-11-01: 650 mg via ORAL
  Filled 2013-11-01: qty 2

## 2013-11-01 MED ORDER — DIPHENHYDRAMINE HCL 50 MG/ML IJ SOLN
50.0000 mg | INTRAMUSCULAR | Status: DC
  Administered 2013-11-01: 50 mg via INTRAVENOUS
  Filled 2013-11-01: qty 1

## 2013-11-01 MED ORDER — METHYLPREDNISOLONE SODIUM SUCC 125 MG IJ SOLR
125.0000 mg | INTRAMUSCULAR | Status: DC
  Administered 2013-11-01: 125 mg via INTRAVENOUS
  Filled 2013-11-01: qty 2

## 2013-11-01 MED ORDER — SODIUM CHLORIDE 0.9 % IV SOLN
INTRAVENOUS | Status: DC
  Administered 2013-11-01: 08:00:00 via INTRAVENOUS

## 2013-11-01 MED ORDER — SODIUM CHLORIDE 0.9 % IV SOLN
1000.0000 mg | INTRAVENOUS | Status: DC
  Administered 2013-11-01: 1000 mg via INTRAVENOUS
  Filled 2013-11-01: qty 100

## 2013-11-01 NOTE — Discharge Instructions (Signed)
Rituximab injection What is this medicine? RITUXIMAB (ri TUX i mab) is a monoclonal antibody. This medicine changes the way the body's immune system works. It is used commonly to treat non-Hodgkin's lymphoma and other conditions. In cancer cells, this drug targets a specific protein within cancer cells and stops the cancer cells from growing. It is also used to treat rhuematoid arthritis (RA). In RA, this medicine slow the inflammatory process and help reduce joint pain and swelling. This medicine is often used with other cancer or arthritis medications. This medicine may be used for other purposes; ask your health care provider or pharmacist if you have questions. COMMON BRAND NAME(S): Rituxan What should I tell my health care provider before I take this medicine? They need to know if you have any of these conditions: -blood disorders -heart disease -history of hepatitis B -infection (especially a virus infection such as chickenpox, cold sores, or herpes) -irregular heartbeat -kidney disease -lung or breathing disease, like asthma -lupus -an unusual or allergic reaction to rituximab, mouse proteins, other medicines, foods, dyes, or preservatives -pregnant or trying to get pregnant -breast-feeding How should I use this medicine? This medicine is for infusion into a vein. It is administered in a hospital or clinic by a specially trained health care professional. A special MedGuide will be given to you by the pharmacist with each prescription and refill. Be sure to read this information carefully each time. Talk to your pediatrician regarding the use of this medicine in children. This medicine is not approved for use in children. Overdosage: If you think you have taken too much of this medicine contact a poison control center or emergency room at once. NOTE: This medicine is only for you. Do not share this medicine with others. What if I miss a dose? It is important not to miss a dose. Call  your doctor or health care professional if you are unable to keep an appointment. What may interact with this medicine? -cisplatin -medicines for blood pressure -some other medicines for arthritis -vaccines This list may not describe all possible interactions. Give your health care provider a list of all the medicines, herbs, non-prescription drugs, or dietary supplements you use. Also tell them if you smoke, drink alcohol, or use illegal drugs. Some items may interact with your medicine. What should I watch for while using this medicine? Report any side effects that you notice during your treatment right away, such as changes in your breathing, fever, chills, dizziness or lightheadedness. These effects are more common with the first dose. Visit your prescriber or health care professional for checks on your progress. You will need to have regular blood work. Report any other side effects. The side effects of this medicine can continue after you finish your treatment. Continue your course of treatment even though you feel ill unless your doctor tells you to stop. Call your doctor or health care professional for advice if you get a fever, chills or sore throat, or other symptoms of a cold or flu. Do not treat yourself. This drug decreases your body's ability to fight infections. Try to avoid being around people who are sick. This medicine may increase your risk to bruise or bleed. Call your doctor or health care professional if you notice any unusual bleeding. Be careful brushing and flossing your teeth or using a toothpick because you may get an infection or bleed more easily. If you have any dental work done, tell your dentist you are receiving this medicine. Avoid taking products  that contain aspirin, acetaminophen, ibuprofen, naproxen, or ketoprofen unless instructed by your doctor. These medicines may hide a fever. Do not become pregnant while taking this medicine. Women should inform their doctor  if they wish to become pregnant or think they might be pregnant. There is a potential for serious side effects to an unborn child. Talk to your health care professional or pharmacist for more information. Do not breast-feed an infant while taking this medicine. What side effects may I notice from receiving this medicine? Side effects that you should report to your doctor or health care professional as soon as possible: -allergic reactions like skin rash, itching or hives, swelling of the face, lips, or tongue -low blood counts - this medicine may decrease the number of white blood cells, red blood cells and platelets. You may be at increased risk for infections and bleeding. -signs of infection - fever or chills, cough, sore throat, pain or difficulty passing urine -signs of decreased platelets or bleeding - bruising, pinpoint red spots on the skin, black, tarry stools, blood in the urine -signs of decreased red blood cells - unusually weak or tired, fainting spells, lightheadedness -breathing problems -confused, not responsive -chest pain -fast, irregular heartbeat -feeling faint or lightheaded, falls -mouth sores -redness, blistering, peeling or loosening of the skin, including inside the mouth -stomach pain -swelling of the ankles, feet, or hands -trouble passing urine or change in the amount of urine Side effects that usually do not require medical attention (report to your doctor or other health care professional if they continue or are bothersome): -anxiety -headache -loss of appetite -muscle aches -nausea -night sweats This list may not describe all possible side effects. Call your doctor for medical advice about side effects. You may report side effects to FDA at 1-800-FDA-1088. Where should I keep my medicine? This drug is given in a hospital or clinic and will not be stored at home. NOTE: This sheet is a summary. It may not cover all possible information. If you have questions  about this medicine, talk to your doctor, pharmacist, or health care provider.  2014, Elsevier/[employer]. (2008-05-01 14:04:59) Vasculitis Vasculitis is when your blood vessels are inflamed. There are many different blood vessels in the body, and vasculitis can affect any of them. This includes large (veins and arteries) and small (capillaries) vessels. With vasculitis,   Blood vessel walls can become thick.  Blood vessels can become narrow.  Blood vessels can become weak. Sometimes, it becomes so weak that the blood vessel bulges out like a balloon. This is called an aneurysm. Aneurysms are rare but can be life-threatening.  Scarring can occur.  Not enough blood can flow through the blood vessels. All of these things can damage many parts of the body, including the muscles, kidneys, lungs and brain. There are many types of vasculitis. Some types are short-term (acute), while others are long-term (chronic). Some types may go away without treatment, and others may need to be treated for a long time.  CAUSES  Vasculitis occurs when the body's immune system (which fights germs and disease) makes a mistake. It attacks its own blood vessels. This causes inflammation (the body's way of reacting to injury or infection).   Why this happens is usually not known. The condition is then called primary vasculitis.  Sometimes, something triggers the inflammation. This is called secondary vasculitis. Possible causes include:  Infections.  An immune system disease. Examples include lupus, rheumatoid arthritis and scleroderma.  An allergic reaction to a  medicine.  Cancer that affects blood cells. This includes leukemia and lymphoma.  Males and females of all ages and races can develop vasculitis. Some risk factors make vasculitis more likely. These include:  Smoking.  Stress.  Physical injury. SYMPTOMS  There are more than 20 types of vasculitis. Symptoms of each type vary, but some  symptoms are common.  Many people with vasculitis:  Have a fever.  Do not feel like eating.  Lose weight.  Feel very tired.  Have aches and pains.  Feel weak.  Start to not have feeling (numbness) in an area.  Symptoms for some types of vasculitis also could be:  Sores in the mouth or eyes.  Skin problems. This could be sores, spots or rashes.  Trouble seeing.  Trouble breathing.  Blood in the urine.  Headaches.  Pain in the abdomen.  Stuffy or bloody nose. DIAGNOSIS  Vasculitis symptoms are similar to symptoms of many other conditions. That can make it hard to tell if you have vasculitis. To be sure, your caregiver will ask about your symptoms and do a physical exam. Certain tests may be necessary, such as:   A complete blood count (CBC). This test shows how many red blood cells are in your blood. Not having enough red blood cells (anemic) can result from vasculitis.  Erythrocyte sedimentation (also called sed rate test). It measures inflammation in the body.  C reactive protein (CRP). This also shows if there is inflammation.  Anti-neutrophil cytoplasmic antibodies (ANCA). This can tell if the immune system is reacting to certain cells in the blood.  A urine test. This checks for blood or protein in the urine. That could be a sign of kidney damage from vasculitis.  Imaging tests. These tests create pictures from inside the body. Options include:  X-rays.  Computed tomography (CT) scan. This uses X-rays guided by a computer.  Ultrasound. It creates an image using sound waves.  Magnetic resonance imaging (MRI). It uses radio waves, magnets and a computer.  Angiography. A dye is put into your blood vessels. Then, an X-ray is taken of them.  A biopsy of a blood vessel. This means your caregiver will take out a small piece of a blood vessel. Then, it is checked under a microscope. This is an important test. It often is the best way to know for sure if you have  vasculitis. TREATMENT   Treatment will depend on the type of vasculitis and how severe it is. Often, you will need to see a specialist in immunologic diseases (rheumatologist).  Some types of vasculitis may go away without treatment.  Some types need only over-the-counter drugs.  Prescription medicines are used to treat many types of vasculitis. For example:  Corticosteroids. These are the drugs used most often. They are very powerful. Usually, a high dose is taken until symptoms improve. Then, the dose is gradually decreased. Using corticosteroids for a long time can cause problems. They can make muscles and bones weak. They can cause blood pressure to go up, and cause diabetes. Also, people often gain weight when they take corticosteroids.  Cytotoxic drugs. These kill cells that cause inflammation. Sometimes, they are used if corticosteroids do not help. Other times, both medications are taken.  Surgery. This may be needed to repair a blood vessel that has bulged out (aneurysm).  Treatment can sometimes cure your disease. Other times, it can put the disease in remission (no symptoms). Increased treatment and reevaluation might be necessary if your disease comes  back or flares. HOME CARE INSTRUCTIONS   Take any medications that your caregiver prescribes. Follow the directions carefully.  Watch for any problems that can be caused by a drug (side effects). Tell your caregiver right away if you notice any changes or problems.  Keep all appointments for checkups. This is important to help your caregiver watch for side effects. Checkups may include:  Periodic blood tests.  Bone density testing. This checks how strong or weak your bones are.  Blood pressure checks. If your blood pressure rises, you may need to take a drug to control it while you are taking corticosteroids.  Blood sugar checks. This is to be sure you are not developing diabetes. If you have diabetes, corticosteroid  medications may make it worse and require increased treatment.  Exercise. First, talk with your caregiver about what would be OK for you to do. Aerobic exercise (which increases your heart rate) is often suggested. It includes walking. This type of exercise is good because it helps prevent bone loss. It also helps control your blood pressure.  Follow a healthy diet. Include good sources of protein in your diet. Also include fruits, vegetables and whole grains. Your caregiver can refer you to an expert on healthy eating (dietitian) for more detailed advice.  Learn as much as you can about vasculitis. Understanding your condition can help you cope with it. Coping can be hard because this may be something you will have to live with for years.  Consider joining a support group. It often helps to talk about your worries with others who have the same problems.  Tell your caregiver if you feel stressed, anxious or depressed. Your caregiver may refer you to a specialist, or recommend medication to relieve your symptoms. SEEK MEDICAL CARE IF:   The symptoms that led to your diagnosis return.  You develop worsening fever, fatigue, headache, weight loss or pain in your jaw.  You develop signs of infection. Infections can be worse if you are on corticosteroid medication.  You develop any new or unexplained symptoms of disease. SEEK IMMEDIATE MEDICAL CARE IF:   Your eyesight changes.  Pain does not go away, even after taking medication.  You feel pain in your chest or abdomen.  You have trouble breathing.  One side of your face or body becomes suddenly weak or numb.  Your nose bleeds.  There is blood in your urine.  You develop a fever of more than 102 F (38.9 C). Document Released: 06/28/2009 Document Revised: 11/24/2011 Document Reviewed: 06/28/2009 [facility] Patient Information 2014 [place].

## 2013-11-15 ENCOUNTER — Encounter (HOSPITAL_COMMUNITY)
Admission: RE | Admit: 2013-11-15 | Discharge: 2013-11-15 | Disposition: A | Discharge status: Home / Self Care | Payer: Medicare Other | Source: Ambulatory Visit | Attending: Nephrology | Admitting: Nephrology

## 2013-11-15 ENCOUNTER — Encounter (HOSPITAL_COMMUNITY): Payer: Self-pay

## 2013-11-15 DIAGNOSIS — I776 Arteritis, unspecified: Secondary | ICD-10-CM | POA: Insufficient documentation

## 2013-11-15 LAB — CBC WITH DIFFERENTIAL/PLATELET
BASOS ABS: 0 10*3/uL (ref 0.0–0.1)
Basophils Relative: 0 % (ref 0–1)
EOS ABS: 0.2 10*3/uL (ref 0.0–0.7)
EOS PCT: 2 % (ref 0–5)
HEMATOCRIT: 29.2 % — AB (ref 36.0–46.0)
Hemoglobin: 9.3 g/dL — ABNORMAL LOW (ref 12.0–15.0)
Lymphocytes Relative: 9 % — ABNORMAL LOW (ref 12–46)
Lymphs Abs: 0.9 10*3/uL (ref 0.7–4.0)
MCH: 31.5 pg (ref 26.0–34.0)
MCHC: 31.8 g/dL (ref 30.0–36.0)
MCV: 99 fL (ref 78.0–100.0)
MONO ABS: 0.6 10*3/uL (ref 0.1–1.0)
Monocytes Relative: 6 % (ref 3–12)
Neutro Abs: 8.5 10*3/uL — ABNORMAL HIGH (ref 1.7–7.7)
Neutrophils Relative %: 84 % — ABNORMAL HIGH (ref 43–77)
Platelets: 247 10*3/uL (ref 150–400)
RBC: 2.95 MIL/uL — ABNORMAL LOW (ref 3.87–5.11)
RDW: 14 % (ref 11.5–15.5)
WBC: 10.2 10*3/uL (ref 4.0–10.5)

## 2013-11-15 LAB — PROTIME-INR
INR: 0.9 (ref 0.00–1.49)
Prothrombin Time: 12 seconds (ref 11.6–15.2)

## 2013-11-15 LAB — APTT: APTT: 26 s (ref 24–37)

## 2013-11-15 MED ORDER — METHYLPREDNISOLONE SODIUM SUCC 125 MG IJ SOLR
125.0000 mg | INTRAMUSCULAR | Status: AC
  Administered 2013-11-15: 125 mg via INTRAVENOUS
  Filled 2013-11-15: qty 2

## 2013-11-15 MED ORDER — DIPHENHYDRAMINE HCL 50 MG/ML IJ SOLN
50.0000 mg | INTRAMUSCULAR | Status: AC
  Administered 2013-11-15: 50 mg via INTRAVENOUS
  Filled 2013-11-15: qty 1

## 2013-11-15 MED ORDER — SODIUM CHLORIDE 0.9 % IV SOLN
INTRAVENOUS | Status: AC
  Administered 2013-11-15: 250 mL via INTRAVENOUS

## 2013-11-15 MED ORDER — ACETAMINOPHEN 325 MG PO TABS
650.0000 mg | ORAL_TABLET | ORAL | Status: AC
  Administered 2013-11-15: 650 mg via ORAL
  Filled 2013-11-15: qty 2

## 2013-11-15 MED ORDER — SODIUM CHLORIDE 0.9 % IV SOLN
1000.0000 mg | INTRAVENOUS | Status: DC
  Filled 2013-11-15: qty 100

## 2013-11-15 NOTE — Progress Notes (Addendum)
Pt here for #2 of 2 infusion of RITUXAN ( #1 of 2 infusion was 11/01/13 here in Short Stay and was uneventful). Pt states she feel much better and felt fine after that last infusion. MCA ( Monoclonal antibody) assessment done and pt met the criteria to infuse the Rituxan. When I was looking at her arms for IV start I noticed petechiae - like rash on both forearms. Pt has 3 bracelets on each arm and states she wears them to sleep in and is sure that is what has caused this. She has just noticed this on both forearms in the last 3 days. No other rash like this is noted on trunk or legs. Placed call to Dr Joelyn Oms and lab work ordered and to [facility] pending lab results

## 2013-11-15 NOTE — Progress Notes (Signed)
Uneventful #2 of 2 infusion of Rituxan. Pt is ready for discharge and awaiting husband to arrive

## 2013-11-15 NOTE — Progress Notes (Signed)
Lab results called to Dr Joelyn Oms. "OK to proceed with Rituxan as ordered"

## 2013-11-15 NOTE — Discharge Instructions (Signed)
RITUXAN Rituximab injection What is this medicine? RITUXIMAB (ri TUX i mab) is a monoclonal antibody. This medicine changes the way the body's immune system works. It is used commonly to treat non-Hodgkin's lymphoma and other conditions. In cancer cells, this drug targets a specific protein within cancer cells and stops the cancer cells from growing. It is also used to treat rhuematoid arthritis (RA). In RA, this medicine slow the inflammatory process and help reduce joint pain and swelling. This medicine is often used with other cancer or arthritis medications. This medicine may be used for other purposes; ask your health care provider or pharmacist if you have questions. COMMON BRAND NAME(S): Rituxan What should I tell my health care provider before I take this medicine? They need to know if you have any of these conditions: -blood disorders -heart disease -history of hepatitis B -infection (especially a virus infection such as chickenpox, cold sores, or herpes) -irregular heartbeat -kidney disease -lung or breathing disease, like asthma -lupus -an unusual or allergic reaction to rituximab, mouse proteins, other medicines, foods, dyes, or preservatives -pregnant or trying to get pregnant -breast-feeding How should I use this medicine? This medicine is for infusion into a vein. It is administered in a hospital or clinic by a specially trained health care professional. A special MedGuide will be given to you by the pharmacist with each prescription and refill. Be sure to read this information carefully each time. Talk to your pediatrician regarding the use of this medicine in children. This medicine is not approved for use in children. Overdosage: If you think you have taken too much of this medicine contact a poison control center or emergency room at once. NOTE: This medicine is only for you. Do not share this medicine with others. What if I miss a dose? It is important not to miss a  dose. Call your doctor or health care professional if you are unable to keep an appointment. What may interact with this medicine? -cisplatin -medicines for blood pressure -some other medicines for arthritis -vaccines This list may not describe all possible interactions. Give your health care provider a list of all the medicines, herbs, non-prescription drugs, or dietary supplements you use. Also tell them if you smoke, drink alcohol, or use illegal drugs. Some items may interact with your medicine. What should I watch for while using this medicine? Report any side effects that you notice during your treatment right away, such as changes in your breathing, fever, chills, dizziness or lightheadedness. These effects are more common with the first dose. Visit your prescriber or health care professional for checks on your progress. You will need to have regular blood work. Report any other side effects. The side effects of this medicine can continue after you finish your treatment. Continue your course of treatment even though you feel ill unless your doctor tells you to stop. Call your doctor or health care professional for advice if you get a fever, chills or sore throat, or other symptoms of a cold or flu. Do not treat yourself. This drug decreases your body's ability to fight infections. Try to avoid being around people who are sick. This medicine may increase your risk to bruise or bleed. Call your doctor or health care professional if you notice any unusual bleeding. Be careful brushing and flossing your teeth or using a toothpick because you may get an infection or bleed more easily. If you have any dental work done, tell your dentist you are receiving this medicine. Avoid taking  products that contain aspirin, acetaminophen, ibuprofen, naproxen, or ketoprofen unless instructed by your doctor. These medicines may hide a fever. Do not become pregnant while taking this medicine. Women should inform  their doctor if they wish to become pregnant or think they might be pregnant. There is a potential for serious side effects to an unborn child. Talk to your health care professional or pharmacist for more information. Do not breast-feed an infant while taking this medicine. What side effects may I notice from receiving this medicine? Side effects that you should report to your doctor or health care professional as soon as possible: -allergic reactions like skin rash, itching or hives, swelling of the face, lips, or tongue -low blood counts - this medicine may decrease the number of white blood cells, red blood cells and platelets. You may be at increased risk for infections and bleeding. -signs of infection - fever or chills, cough, sore throat, pain or difficulty passing urine -signs of decreased platelets or bleeding - bruising, pinpoint red spots on the skin, black, tarry stools, blood in the urine -signs of decreased red blood cells - unusually weak or tired, fainting spells, lightheadedness -breathing problems -confused, not responsive -chest pain -fast, irregular heartbeat -feeling faint or lightheaded, falls -mouth sores -redness, blistering, peeling or loosening of the skin, including inside the mouth -stomach pain -swelling of the ankles, feet, or hands -trouble passing urine or change in the amount of urine Side effects that usually do not require medical attention (report to your doctor or other health care professional if they continue or are bothersome): -anxiety -headache -loss of appetite -muscle aches -nausea -night sweats This list may not describe all possible side effects. Call your doctor for medical advice about side effects. You may report side effects to FDA at 1-800-FDA-1088. Where should I keep my medicine? This drug is given in a hospital or clinic and will not be stored at home. NOTE: This sheet is a summary. It may not cover all possible information. If you  have questions about this medicine, talk to your doctor, pharmacist, or health care provider.  2014, Elsevier/Gold Standard. (2008-05-01 14:04:59)

## 2013-12-30 ENCOUNTER — Other Ambulatory Visit: Payer: Self-pay | Admitting: Neurology

## 2013-12-30 DIAGNOSIS — R4182 Altered mental status, unspecified: Secondary | ICD-10-CM

## 2014-01-02 MED FILL — Rituximab For IV Inj Conc 10 MG/ML: INTRAVENOUS | Qty: 100 | Status: AC

## 2014-01-02 MED FILL — Sodium Chloride IV Soln 0.9%: INTRAVENOUS | Qty: 250 | Status: AC

## 2014-01-10 ENCOUNTER — Ambulatory Visit
Admission: RE | Admit: 2014-01-10 | Discharge: 2014-01-10 | Disposition: A | Discharge status: Home / Self Care | Payer: Medicare Other | Source: Ambulatory Visit | Attending: Neurology | Admitting: Neurology

## 2014-01-10 DIAGNOSIS — R4182 Altered mental status, unspecified: Secondary | ICD-10-CM

## 2014-01-12 ENCOUNTER — Other Ambulatory Visit (HOSPITAL_COMMUNITY): Payer: Self-pay

## 2014-01-12 DIAGNOSIS — G4730 Sleep apnea, unspecified: Secondary | ICD-10-CM

## 2014-01-23 ENCOUNTER — Encounter (INDEPENDENT_AMBULATORY_CARE_PROVIDER_SITE_OTHER): Payer: Self-pay | Admitting: *Deleted

## 2014-01-26 ENCOUNTER — Other Ambulatory Visit (INDEPENDENT_AMBULATORY_CARE_PROVIDER_SITE_OTHER): Payer: Self-pay | Admitting: *Deleted

## 2014-01-26 ENCOUNTER — Encounter (INDEPENDENT_AMBULATORY_CARE_PROVIDER_SITE_OTHER): Payer: Self-pay | Admitting: *Deleted

## 2014-01-26 DIAGNOSIS — Z1211 Encounter for screening for malignant neoplasm of colon: Secondary | ICD-10-CM

## 2014-02-03 ENCOUNTER — Ambulatory Visit: Payer: Medicare Other | Attending: Neurology | Admitting: Sleep Medicine

## 2014-02-03 VITALS — Ht 66.0 in | Wt 142.0 lb

## 2014-02-03 DIAGNOSIS — Z79899 Other long term (current) drug therapy: Secondary | ICD-10-CM | POA: Insufficient documentation

## 2014-02-03 DIAGNOSIS — G4730 Sleep apnea, unspecified: Secondary | ICD-10-CM

## 2014-02-03 DIAGNOSIS — G4733 Obstructive sleep apnea (adult) (pediatric): Secondary | ICD-10-CM | POA: Insufficient documentation

## 2014-02-03 DIAGNOSIS — IMO0002 Reserved for concepts with insufficient information to code with codable children: Secondary | ICD-10-CM | POA: Insufficient documentation

## 2014-02-15 NOTE — Sleep Study (Signed)
  [place] A. Merlene Laughter, MD     [email or web address]        NOCTURNAL POLYSOMNOGRAM    LOCATION: SLEEP LAB FACILITY: [place]   PHYSICIAN: Nycholas Rayner A. Merlene Laughter, M.D.   DATE OF STUDY: 02/03/2014.   REFERRING PHYSICIAN: Marycruz Boehner.  INDICATIONS: This is 79 year old complains of fatigue, hypersomnia and snoring.  MEDICATIONS:  Prior to Admission medications   Medication Sig Start Date End Date Taking? Authorizing Provider  acetaminophen (TYLENOL) 325 MG tablet Take 325 mg by mouth every 6 (six) hours as needed for mild pain.    Historical Provider, MD  ALPRAZolam Duanne Moron) 1 MG tablet Take 1 mg by mouth as needed for anxiety.    Historical Provider, MD  amLODipine (NORVASC) 10 MG tablet Take 10 mg by mouth daily.    Historical Provider, MD  Biotin 5000 MCG TABS Take by mouth. 10,000 mcg each tablet    Historical Provider, MD  gemfibrozil (LOPID) 600 MG tablet Take 600 mg by mouth 2 (two) times daily before a meal.    Historical Provider, MD  lovastatin (MEVACOR) 20 MG tablet Take 20 mg by mouth daily.    Historical Provider, MD  Multiple Vitamins-Minerals (CENTRUM SILVER PO) Take 1 tablet by mouth daily.    Historical Provider, MD  ondansetron (ZOFRAN) 8 MG tablet  07/07/13   Historical Provider, MD  predniSONE (DELTASONE) 20 MG tablet Take 20 mg by mouth daily. 1/4 of a pill 1 time daily    Historical Provider, MD  ranitidine (ZANTAC) 150 MG tablet Take 150 mg by mouth daily as needed for heartburn. For heartburn    Historical Provider, MD      EPWORTH SLEEPINESS SCALE: 8.   BMI: 23.   ARCHITECTURAL SUMMARY: Total recording time was 449 minutes. Sleep efficiency 63 %. Sleep latency 26 minutes. REM latency 169 minutes. Stage NI 6 %, N2 53 % and N3 12 % and REM sleep 28 %.    RESPIRATORY DATA:  Baseline oxygen saturation is 96 %. The lowest saturation is 80 %. The diagnostic AHI is 31. The RDI is 32. The REM AHI is 58. Supine AHI is 70.  LIMB MOVEMENT SUMMARY:  PLM index 9.   ELECTROCARDIOGRAM SUMMARY: Average heart rate is 72 with no significant dysrhythmias observed.   IMPRESSION:  1. Moderate obstructive sleep apnea syndrome worse in the supine position and worse during REM sleep. A formal CPAP titration recording is suggested or AutoPap.  Thanks for this referral.  Jolanta Cabeza A. Merlene Laughter, M.D. Diplomat, Tax adviser of Sleep Medicine.

## 2014-02-27 ENCOUNTER — Telehealth (INDEPENDENT_AMBULATORY_CARE_PROVIDER_SITE_OTHER): Payer: Self-pay | Admitting: *Deleted

## 2014-02-27 DIAGNOSIS — Z1211 Encounter for screening for malignant neoplasm of colon: Secondary | ICD-10-CM

## 2014-02-27 NOTE — Telephone Encounter (Signed)
Patient needs movi prep 

## 2014-03-08 MED ORDER — PEG-KCL-NACL-NASULF-NA ASC-C 100 G PO SOLR: 1.0000 | Freq: Once | ORAL | Status: DC

## 2014-04-11 ENCOUNTER — Telehealth (INDEPENDENT_AMBULATORY_CARE_PROVIDER_SITE_OTHER): Payer: Self-pay | Admitting: *Deleted

## 2014-04-11 NOTE — Telephone Encounter (Signed)
  Procedure: tcs  Reason/Indication:  screening  Has patient had this procedure before?  Yes, 2004  If so, when, by whom and where?    Is there a family history of colon cancer?  no  Who?  What age when diagnosed?    Is patient diabetic?   no      Does patient have prosthetic heart valve?  no  Do you have a pacemaker?  no  Has patient ever had endocarditis? no  Has patient had joint replacement within last 12 months?  no  Does patient tend to be constipated or take laxatives? no  Is patient on Coumadin, Plavix and/or Aspirin? no  Medications: gemfibrozil 600 mg bid, lovastatin 20 mg daily, vit b 12 daily, alprazolam 1 mg daily, zantac prn  Allergies: codiene  Medication Adjustment:   Procedure date & time: 05/10/14 at 830

## 2014-04-12 NOTE — Telephone Encounter (Signed)
agree

## 2014-04-17 ENCOUNTER — Encounter (HOSPITAL_COMMUNITY): Payer: Self-pay

## 2014-05-09 ENCOUNTER — Other Ambulatory Visit (INDEPENDENT_AMBULATORY_CARE_PROVIDER_SITE_OTHER): Payer: Self-pay | Admitting: *Deleted

## 2014-05-09 DIAGNOSIS — Z1211 Encounter for screening for malignant neoplasm of colon: Secondary | ICD-10-CM

## 2014-05-10 ENCOUNTER — Ambulatory Visit (HOSPITAL_COMMUNITY)
Admission: RE | Admit: 2014-05-10 | Discharge: 2014-05-10 | Disposition: A | Discharge status: Home / Self Care | Payer: Medicare Other | Source: Ambulatory Visit | Attending: Internal Medicine | Admitting: Internal Medicine

## 2014-05-10 ENCOUNTER — Encounter (HOSPITAL_COMMUNITY)
Admission: RE | Disposition: A | Discharge status: Home / Self Care | Payer: Self-pay | Source: Ambulatory Visit | Attending: Internal Medicine

## 2014-05-10 ENCOUNTER — Encounter (HOSPITAL_COMMUNITY): Payer: Self-pay

## 2014-05-10 DIAGNOSIS — Z87891 Personal history of nicotine dependence: Secondary | ICD-10-CM | POA: Diagnosis not present

## 2014-05-10 DIAGNOSIS — K5730 Diverticulosis of large intestine without perforation or abscess without bleeding: Secondary | ICD-10-CM | POA: Insufficient documentation

## 2014-05-10 DIAGNOSIS — Z853 Personal history of malignant neoplasm of breast: Secondary | ICD-10-CM | POA: Insufficient documentation

## 2014-05-10 DIAGNOSIS — E7800 Pure hypercholesterolemia, unspecified: Secondary | ICD-10-CM | POA: Diagnosis not present

## 2014-05-10 DIAGNOSIS — F411 Generalized anxiety disorder: Secondary | ICD-10-CM | POA: Diagnosis not present

## 2014-05-10 DIAGNOSIS — K644 Residual hemorrhoidal skin tags: Secondary | ICD-10-CM | POA: Diagnosis not present

## 2014-05-10 DIAGNOSIS — Z1211 Encounter for screening for malignant neoplasm of colon: Secondary | ICD-10-CM | POA: Diagnosis present

## 2014-05-10 DIAGNOSIS — Q438 Other specified congenital malformations of intestine: Secondary | ICD-10-CM | POA: Diagnosis not present

## 2014-05-10 DIAGNOSIS — K219 Gastro-esophageal reflux disease without esophagitis: Secondary | ICD-10-CM | POA: Insufficient documentation

## 2014-05-10 DIAGNOSIS — Z79899 Other long term (current) drug therapy: Secondary | ICD-10-CM | POA: Insufficient documentation

## 2014-05-10 DIAGNOSIS — I10 Essential (primary) hypertension: Secondary | ICD-10-CM | POA: Insufficient documentation

## 2014-05-10 HISTORY — PX: COLONOSCOPY: SHX5424

## 2014-05-10 SURGERY — COLONOSCOPY
Anesthesia: Moderate Sedation

## 2014-05-10 MED ORDER — MEPERIDINE HCL 50 MG/ML IJ SOLN
INTRAMUSCULAR | Status: DC
Start: 2014-05-10 — End: 2014-05-10
  Filled 2014-05-10: qty 1

## 2014-05-10 MED ORDER — SODIUM CHLORIDE 0.9 % IV SOLN
INTRAVENOUS | Status: DC
  Administered 2014-05-10: 08:00:00 via INTRAVENOUS

## 2014-05-10 MED ORDER — STERILE WATER FOR IRRIGATION IR SOLN
Status: DC | PRN
  Administered 2014-05-10: 09:00:00

## 2014-05-10 MED ORDER — MIDAZOLAM HCL 5 MG/5ML IJ SOLN
INTRAMUSCULAR | Status: AC
  Filled 2014-05-10: qty 10

## 2014-05-10 MED ORDER — MEPERIDINE HCL 50 MG/ML IJ SOLN
INTRAMUSCULAR | Status: DC | PRN
  Administered 2014-05-10: 15 mg via INTRAVENOUS
  Administered 2014-05-10: 10 mg via INTRAVENOUS
  Administered 2014-05-10: 25 mg via INTRAVENOUS

## 2014-05-10 MED ORDER — MIDAZOLAM HCL 5 MG/5ML IJ SOLN
INTRAMUSCULAR | Status: DC | PRN
  Administered 2014-05-10 (×2): 2 mg via INTRAVENOUS
  Administered 2014-05-10 (×2): 1 mg via INTRAVENOUS

## 2014-05-10 NOTE — Op Note (Signed)
COLONOSCOPY PROCEDURE REPORT  PATIENT:  Colleen Parks  MR#:  [id number] Birthdate:  [birth year], 79 y.o., female Endoscopist:  Dr. Rogene Houston, MD Referred By:  Dr. Estill Bamberg. Karie Kirks, M.D.  Procedure Date: 05/10/2014  Procedure:   Colonoscopy  Indications:  Patient is a 79 year old Caucasian female who is undergoing average risk screening colonoscopy.  Informed Consent:  The procedure and risks were reviewed with the patient and informed consent was obtained.  Medications:  Demerol 50 mg IV Versed 6 mg IV  Description of procedure:  After a digital rectal exam was performed, that colonoscope was advanced from the anus through the rectum and colon to the area of the cecum, ileocecal valve and appendiceal orifice. The cecum was deeply intubated. These structures were well-seen and photographed for the record. From the level of the cecum and ileocecal valve, the scope was slowly and cautiously withdrawn. The mucosal surfaces were carefully surveyed utilizing scope tip to flexion to facilitate fold flattening as needed. The scope was pulled down into the rectum where a thorough exam including retroflexion was performed.  Findings:   Prep excellent. Tortuous sigmoid colon.  Scattered diverticula throughout the colon but most of these were at sigmoid colon. Normal rectal mucosa. Small hemorrhoids below the dentate line.   Therapeutic/Diagnostic Maneuvers Performed:   None  Complications:  None  Cecal Withdrawal Time:  6 minutes  Impression:  Examination performed to cecum. Few scattered diverticula throughout the colon but most were located at sigmoid colon. small external hemorrhoids  Recommendations:  Standard instructions given.  High fiber diet.    Tre Sanker U  05/10/2014 9:08 AM  CC: Dr. Robert Bellow, MD & Dr. Rayne Du ref. provider found

## 2014-05-10 NOTE — H&P (Signed)
Colleen Parks is an 79 y.o. female.   Chief Complaint: Patient is here for colonoscopy. HPI: Patient is  79 year old Caucasian female who presents for screening colonoscopy. She denies, pain change in bowel habits or rectal. Patient's last colonoscopy was 11 years ago. Family history is negative for CRC.  Past Medical History  Diagnosis Date  . Hypercholesterolemia   . RAS (renal artery stenosis)   . Subclavian arterial stenosis   . HTN (hypertension)   . Diabetes mellitus     states she is no longer treated for this  . GERD (gastroesophageal reflux disease)   . Anxiety   . ANCA-associated vasculitis   . Glomerulonephritis   . Cancer      left breast    Past Surgical History  Procedure Laterality Date  . Left breast mastectomy with chemo  1995   . Biopsy thyroid    . Cataract extraction w/phaco  12/18/2011    Procedure: CATARACT EXTRACTION PHACO AND INTRAOCULAR LENS PLACEMENT (IOC);  Surgeon: Tonny Branch, MD;  Location: [facility];  Service: Ophthalmology;  Laterality: Right;  CDE:17.77  . Cataract extraction w/phaco  01/26/2012    Procedure: CATARACT EXTRACTION PHACO AND INTRAOCULAR LENS PLACEMENT (IOC);  Surgeon: Tonny Branch, MD;  Location: [facility];  Service: Ophthalmology;  Laterality: Left;  CDE 15.23    Family History  Problem Relation Age of Onset  . Coronary artery disease      positive for premature- on fathers side  . Anesthesia problems Neg Hx   . Hypotension Neg Hx   . Malignant hyperthermia Neg Hx   . Pseudochol deficiency Neg Hx    Social History:  reports that she quit smoking about 55 years ago. Her smoking use included Cigarettes. She has a 2 pack-year smoking history. She does not have any smokeless tobacco history on file. She reports that she does not drink alcohol or use illicit drugs.  Allergies:  Allergies  Allergen Reactions  . Codeine Nausea And Vomiting    Medications Prior to Admission  Medication Sig Dispense Refill  . acetaminophen (TYLENOL) 325  MG tablet Take 325 mg by mouth every 6 (six) hours as needed for mild pain.      Marland Kitchen ALPRAZolam (XANAX) 1 MG tablet Take 1 mg by mouth daily as needed for anxiety.       Marland Kitchen amLODipine (NORVASC) 2.5 MG tablet Take 2.5 mg by mouth.      . lovastatin (MEVACOR) 20 MG tablet Take 20 mg by mouth daily.      . peg 3350 powder (MOVIPREP) 100 G SOLR Take 1 kit (200 g total) by mouth once.  1 kit  0  . ranitidine (ZANTAC) 150 MG tablet Take 150 mg by mouth daily as needed for heartburn. For heartburn      . cyanocobalamin 2000 MCG tablet Take 2,000 mcg by mouth daily.      Marland Kitchen gemfibrozil (LOPID) 600 MG tablet Take 600 mg by mouth 2 (two) times daily before a meal.        No results found for this or any previous visit (from the past 48 hour(s)). No results found.  ROS  Blood pressure 140/52, pulse 69, temperature 97.8 F (36.6 C), temperature source Oral, resp. rate 22, height _0  (1.676 m), weight 141 lb (63.957 kg), SpO2 98.00%. Physical Exam  Constitutional: She appears well-developed and well-nourished.  HENT:  Mouth/Throat: Oropharynx is clear and moist.  Eyes: Conjunctivae are normal. No scleral icterus.  Neck: No thyromegaly present.  Cardiovascular: Normal rate, regular rhythm and normal heart sounds.   No murmur heard. Respiratory: Effort normal and breath sounds normal.  GI: Soft. She exhibits no distension and no mass. There is no tenderness.  Musculoskeletal: She exhibits no edema.  Lymphadenopathy:    She has no cervical adenopathy.  Neurological: She is alert.  Skin: Skin is warm and dry.     Assessment/Plan Average risk screening colonoscopy.  REHMAN,NAJEEB U 05/10/2014, 8:26 AM

## 2014-05-10 NOTE — Discharge Instructions (Signed)
Resume usual medications and high fiber diet. No driving for 24 hours.    Hemorrhoids Hemorrhoids are swollen veins around the rectum or anus. There are two types of hemorrhoids:   Internal hemorrhoids. These occur in the veins just inside the rectum. They may poke through to the outside and become irritated and painful.  External hemorrhoids. These occur in the veins outside the anus and can be felt as a painful swelling or hard lump near the anus. CAUSES  Pregnancy.   Obesity.   Constipation or diarrhea.   Straining to have a bowel movement.   Sitting for long periods on the toilet.  Heavy lifting or other activity that caused you to strain.  Anal intercourse. SYMPTOMS   Pain.   Anal itching or irritation.   Rectal bleeding.   Fecal leakage.   Anal swelling.   One or more lumps around the anus.  DIAGNOSIS  Your caregiver may be able to diagnose hemorrhoids by visual examination. Other examinations or tests that may be performed include:   Examination of the rectal area with a gloved hand (digital rectal exam).   Examination of anal canal using a small tube (scope).   A blood test if you have lost a significant amount of blood.  A test to look inside the colon (sigmoidoscopy or colonoscopy). TREATMENT Most hemorrhoids can be treated at home. However, if symptoms do not seem to be getting better or if you have a lot of rectal bleeding, your caregiver may perform a procedure to help make the hemorrhoids get smaller or remove them completely. Possible treatments include:   Placing a rubber band at the base of the hemorrhoid to cut off the circulation (rubber band ligation).   Injecting a chemical to shrink the hemorrhoid (sclerotherapy).   Using a tool to burn the hemorrhoid (infrared light therapy).   Surgically removing the hemorrhoid (hemorrhoidectomy).   Stapling the hemorrhoid to block blood flow to the tissue (hemorrhoid stapling).   HOME CARE INSTRUCTIONS   Eat foods with fiber, such as whole grains, beans, nuts, fruits, and vegetables. Ask your doctor about taking products with added fiber in them (fibersupplements).  Increase fluid intake. Drink enough water and fluids to keep your urine clear or pale yellow.   Exercise regularly.   Go to the bathroom when you have the urge to have a bowel movement. Do not wait.   Avoid straining to have bowel movements.   Keep the anal area dry and clean. Use wet toilet paper or moist towelettes after a bowel movement.   Medicated creams and suppositories may be used or applied as directed.   Only take over-the-counter or prescription medicines as directed by your caregiver.   Take warm sitz baths for 15-20 minutes, 3-4 times a day to ease pain and discomfort.   Place ice packs on the hemorrhoids if they are tender and swollen. Using ice packs between sitz baths may be helpful.   Put ice in a plastic bag.   Place a towel between your skin and the bag.   Leave the ice on for 15-20 minutes, 3-4 times a day.   Do not use a donut-shaped pillow or sit on the toilet for long periods. This increases blood pooling and pain.  SEEK MEDICAL CARE IF:  You have increasing pain and swelling that is not controlled by treatment or medicine.  You have uncontrolled bleeding.  You have difficulty or you are unable to have a bowel movement.  You have pain  or inflammation outside the area of the hemorrhoids. MAKE SURE YOU:  Understand these instructions.  Will watch your condition.  Will get help right away if you are not doing well or get worse. Document Released: 08/29/2000 Document Revised: 08/18/2012 Document Reviewed: 07/06/2012 [facility] Patient Information 2015 [place], [place]. This information is not intended to replace advice given to you by your health care provider. Make sure you discuss any questions you have with your health care  provider. Diverticulosis Diverticulosis is the condition that develops when small pouches (diverticula) form in the wall of your colon. Your colon, or large intestine, is where water is absorbed and stool is formed. The pouches form when the inside layer of your colon pushes through weak spots in the outer layers of your colon. CAUSES  No one knows exactly what causes diverticulosis. RISK FACTORS  Being older than 71. Your risk for this condition increases with age. Diverticulosis is rare in people younger than 40 years. By age 86, almost everyone has it.  Eating a low-fiber diet.  Being frequently constipated.  Being overweight.  Not getting enough exercise.  Smoking.  Taking over-the-counter pain medicines, like aspirin and ibuprofen. SYMPTOMS  Most people with diverticulosis do not have symptoms. DIAGNOSIS  Because diverticulosis often has no symptoms, health care providers often discover the condition during an exam for other colon problems. In many cases, a health care provider will diagnose diverticulosis while using a flexible scope to examine the colon (colonoscopy). TREATMENT  If you have never developed an infection related to diverticulosis, you may not need treatment. If you have had an infection before, treatment may include:  Eating more fruits, vegetables, and grains.  Taking a fiber supplement.  Taking a live bacteria supplement (probiotic).  Taking medicine to relax your colon. HOME CARE INSTRUCTIONS   Drink at least 6-8 glasses of water each day to prevent constipation.  Try not to strain when you have a bowel movement.  Keep all follow-up appointments. If you have had an infection before:  Increase the fiber in your diet as directed by your health care provider or dietitian.  Take a dietary fiber supplement if your health care provider approves.  Only take medicines as directed by your health care provider. SEEK MEDICAL CARE IF:   You have  abdominal pain.  You have bloating.  You have cramps.  You have not gone to the bathroom in 3 days. SEEK IMMEDIATE MEDICAL CARE IF:   Your pain gets worse.  Yourbloating becomes very bad.  You have a fever or chills, and your symptoms suddenly get worse.  You begin vomiting.  You have bowel movements that are bloody or black. MAKE SURE YOU:  Understand these instructions.  Will watch your condition.  Will get help right away if you are not doing well or get worse. Document Released: 05/29/2004 Document Revised: 09/06/2013 Document Reviewed: 07/27/2013 [facility] Patient Information 2015 [place], [place]. This information is not intended to replace advice given to you by your health care provider. Make sure you discuss any questions you have with your health care provider. Colonoscopy, Care After Refer to this sheet in the next few weeks. These instructions provide you with information on caring for yourself after your procedure. Your health care provider may also give you more specific instructions. Your treatment has been planned according to current medical practices, but problems sometimes occur. Call your health care provider if you have any problems or questions after your procedure. WHAT TO EXPECT AFTER THE PROCEDURE  After your procedure, it is typical to have the following:  A small amount of blood in your stool.  Moderate amounts of gas and mild abdominal cramping or bloating. HOME CARE INSTRUCTIONS  Do not drive, operate machinery, or sign important documents for 24 hours.  You may shower and resume your regular physical activities, but move at a slower pace for the first 24 hours.  Take frequent rest periods for the first 24 hours.  Walk around or put a warm pack on your abdomen to help reduce abdominal cramping and bloating.  Drink enough fluids to keep your urine clear or pale yellow.  You may resume your normal diet as instructed by your health care  provider. Avoid heavy or fried foods that are hard to digest.  Avoid drinking alcohol for 24 hours or as instructed by your health care provider.  Only take over-the-counter or prescription medicines as directed by your health care provider.  If a tissue sample (biopsy) was taken during your procedure:  Do not take aspirin or blood thinners for 7 days, or as instructed by your health care provider.  Do not drink alcohol for 7 days, or as instructed by your health care provider.  Eat soft foods for the first 24 hours. SEEK MEDICAL CARE IF: You have persistent spotting of blood in your stool 2-3 days after the procedure. SEEK IMMEDIATE MEDICAL CARE IF:  You have more than a small spotting of blood in your stool.  You pass large blood clots in your stool.  Your abdomen is swollen (distended).  You have nausea or vomiting.  You have a fever.  You have increasing abdominal pain that is not relieved with medicine. Document Released: 04/15/2004 Document Revised: 06/22/2013 Document Reviewed: 05/09/2013 [facility] Patient Information 2015 [place], [place]. This information is not intended to replace advice given to you by your health care provider. Make sure you discuss any questions you have with your health care provider.

## 2014-05-11 ENCOUNTER — Encounter (HOSPITAL_COMMUNITY): Payer: Self-pay | Admitting: Internal Medicine

## 2014-08-18 ENCOUNTER — Other Ambulatory Visit (HOSPITAL_COMMUNITY): Payer: Self-pay | Admitting: Family Medicine

## 2014-08-18 ENCOUNTER — Ambulatory Visit (INDEPENDENT_AMBULATORY_CARE_PROVIDER_SITE_OTHER): Payer: Medicare Other | Admitting: Cardiovascular Disease

## 2014-08-18 ENCOUNTER — Encounter: Payer: Self-pay | Admitting: Cardiovascular Disease

## 2014-08-18 VITALS — BP 160/70 | HR 68 | Ht 66.0 in | Wt 139.0 lb

## 2014-08-18 DIAGNOSIS — R0989 Other specified symptoms and signs involving the circulatory and respiratory systems: Secondary | ICD-10-CM

## 2014-08-18 DIAGNOSIS — Z1231 Encounter for screening mammogram for malignant neoplasm of breast: Secondary | ICD-10-CM

## 2014-08-18 DIAGNOSIS — E119 Type 2 diabetes mellitus without complications: Secondary | ICD-10-CM

## 2014-08-18 DIAGNOSIS — E782 Mixed hyperlipidemia: Secondary | ICD-10-CM

## 2014-08-18 DIAGNOSIS — G930 Cerebral cysts: Secondary | ICD-10-CM

## 2014-08-18 DIAGNOSIS — N059 Unspecified nephritic syndrome with unspecified morphologic changes: Secondary | ICD-10-CM

## 2014-08-18 DIAGNOSIS — I10 Essential (primary) hypertension: Secondary | ICD-10-CM

## 2014-08-18 MED ORDER — AMLODIPINE BESYLATE 5 MG PO TABS: 5.0000 mg | ORAL_TABLET | Freq: Every day | ORAL | Status: DC

## 2014-08-18 NOTE — Assessment & Plan Note (Signed)
Cholesterol is at goal.  Continue current dose of statin and diet Rx.  No myalgias or side effects.  F/U  LFT's in 6 months. Lab Results  Component Value Date   [facility] 145 07/12/2009  Labs with primary continue lopid and mevacor

## 2014-08-18 NOTE — Assessment & Plan Note (Signed)
Home readings also high Increase amlodipine to 5 mg daily and f/u with Joelyn Oms and primary

## 2014-08-18 NOTE — Assessment & Plan Note (Signed)
Known moderate LICA stenosis f/u duplex ASA

## 2014-08-18 NOTE — Assessment & Plan Note (Signed)
6 cm right temporal cyst ? Swelling while on steroids  LUE weakness resolved  F/u neurology

## 2014-08-18 NOTE — Assessment & Plan Note (Signed)
F/U [facility] no recent Cr in epic  Off steroids

## 2014-08-18 NOTE — Assessment & Plan Note (Signed)
Improved off steroids used to Rx kidney disease

## 2014-08-18 NOTE — Patient Instructions (Addendum)
Your physician wants you to follow-up in: 6 months You will receive a reminder letter in the mail two months in advance. If you don't receive a letter, please call our office to schedule the follow-up appointment.  Your physician has requested that you have a carotid duplex. This test is an ultrasound of the carotid arteries in your neck. It looks at blood flow through these arteries that supply the brain with blood. Allow one hour for this exam. There are no restrictions or special instructions.    Your physician has recommended you make the following change in your medication:     INCREASE Amlodipine to 5 mg daily       Thank you for choosing [facility] !

## 2014-08-18 NOTE — Progress Notes (Signed)
Patient ID: Colleen Parks, female   DOB: [birth year], 79 y.o.   MRN: [id number] Colleen Parks is seen today for F/U of elevated lipids, HTN. She has previously been seen by Helayne Seminole from [facility]. There was a question of carotid disease but duplex done 12/10 showed no significatn disease. CRF's include elevated lipid and HTN which is finally Rx. She also had a HbA1c of 7.3 and has begun Rx for type 2 DM. She is active in the garden and bowls She is not having any sscp, palpitations, PND, orthopnea or edema. She had a normal myouve in 2008 with no documented CAD Looking forward to going to [place] to bowl. Some muscular pain in back of right neck Was on zocor 80 mg and we changed to pravachol 80 She subsequently was changed to crestor but this is too expensive for her Chol meds changed by Dr Karie Kirks  Now on lopid and mevacor   Glomerulonephritis in remission  Frustrated by experience at [facility]  "They lost my biopsy"  Then told me they didn't get enough tissue to make diagnosis  Off steroids  Had right brain cyst that caused temporary RUE weakness That is now better   History of LICA stenosis 0000000 duplex 50-69% needs f/u       ROS: Denies fever, malais, weight loss, blurry vision, decreased visual acuity, cough, sputum, SOB, hemoptysis, pleuritic pain, palpitaitons, heartburn, abdominal pain, melena, lower extremity edema, claudication, or rash.  All other systems reviewed and negative  General: Affect appropriate Healthy:  appears stated age 89: normal Neck supple with no adenopathy JVP normal bilateal bruits no thyromegaly Lungs clear with no wheezing and good diaphragmatic motion Heart:  S1/S2 no murmur, no rub, gallop or click PMI normal Abdomen: benighn, BS positve, no tenderness, no AAA no bruit.  No HSM or HJR Distal pulses intact with no bruits No edema Neuro non-focal Skin warm and dry No muscular weakness   Current Outpatient Prescriptions  Medication Sig Dispense  Refill  . acetaminophen (TYLENOL) 325 MG tablet Take 325 mg by mouth every 6 (six) hours as needed for mild pain.    Marland Kitchen ALPRAZolam (XANAX) 1 MG tablet Take 1 mg by mouth daily as needed for anxiety.     Marland Kitchen amLODipine (NORVASC) 2.5 MG tablet Take 2.5 mg by mouth.    Marland Kitchen gemfibrozil (LOPID) 600 MG tablet Take 600 mg by mouth 2 (two) times daily before a meal.    . lovastatin (MEVACOR) 20 MG tablet Take 20 mg by mouth daily.    . ranitidine (ZANTAC) 150 MG tablet Take 150 mg by mouth daily as needed for heartburn. For heartburn     No current facility-administered medications for this visit.    Allergies  Codeine  Electrocardiogram: 11/14  SR rate 74 normal today no change NSR normal ECG   Assessment and Plan

## 2014-08-22 ENCOUNTER — Ambulatory Visit (HOSPITAL_COMMUNITY)
Admission: RE | Admit: 2014-08-22 | Discharge: 2014-08-22 | Disposition: A | Discharge status: Home / Self Care | Payer: Medicare Other | Source: Ambulatory Visit | Attending: Cardiovascular Disease | Admitting: Cardiovascular Disease

## 2014-08-22 DIAGNOSIS — R0989 Other specified symptoms and signs involving the circulatory and respiratory systems: Secondary | ICD-10-CM | POA: Insufficient documentation

## 2014-09-13 ENCOUNTER — Ambulatory Visit (HOSPITAL_COMMUNITY)
Admission: RE | Admit: 2014-09-13 | Discharge: 2014-09-13 | Disposition: A | Discharge status: Home / Self Care | Payer: Medicare Other | Source: Ambulatory Visit | Attending: Family Medicine | Admitting: Family Medicine

## 2014-09-13 DIAGNOSIS — Z1231 Encounter for screening mammogram for malignant neoplasm of breast: Secondary | ICD-10-CM | POA: Insufficient documentation

## 2015-01-10 ENCOUNTER — Other Ambulatory Visit: Payer: Self-pay | Admitting: Neurology

## 2015-01-10 DIAGNOSIS — G930 Cerebral cysts: Secondary | ICD-10-CM

## 2015-01-17 ENCOUNTER — Other Ambulatory Visit (HOSPITAL_COMMUNITY): Payer: Self-pay

## 2015-01-31 ENCOUNTER — Ambulatory Visit
Admission: RE | Admit: 2015-01-31 | Discharge: 2015-01-31 | Disposition: A | Discharge status: Home / Self Care | Payer: Medicare Other | Source: Ambulatory Visit | Attending: Neurology | Admitting: Neurology

## 2015-01-31 DIAGNOSIS — G930 Cerebral cysts: Secondary | ICD-10-CM

## 2015-02-26 NOTE — Progress Notes (Signed)
Patient ID: Colleen Parks, female   DOB: [birth year], 80 y.o.   MRN: [id number] Colleen Parks is seen today for F/U of elevated lipids, HTN. She has previously been seen by Helayne Seminole from [facility].  CRF's include elevated lipid and HTN which is finally Rx. She also had a HbA1c of 7.3 and has begun Rx for type 2 DM. She is active in the garden and bowls She is not having any sscp, palpitations, PND, orthopnea or edema. She had a normal myouve in 2008 with no documented CAD Looking forward to going to [place] to bowl. Some muscular pain in back of right neck Was on zocor 80 mg and we changed to pravachol 80 She subsequently was changed to crestor but this is too expensive for her Chol meds changed by Dr Karie Kirks  Now on lopid and mevacor   Glomerulonephritis in remission  Frustrated by experience at [facility]  "They lost my biopsy"  Then told me they didn't get enough tissue to make diagnosis  Off steroids  Had right brain cyst that caused temporary RUE weakness That is now better   History of LICA stenosis [id number]   duplex 50-69% needs f/u   Intolerant to multiple statins in past LDL followed by primary and still very high.  2 weeks ago she started taking crestor 20 mg every 3rd day Discussed PSK9 as did her primary but she is terrified to given herself shots    ROS: Denies fever, malais, weight loss, blurry vision, decreased visual acuity, cough, sputum, SOB, hemoptysis, pleuritic pain, palpitaitons, heartburn, abdominal pain, melena, lower extremity edema, claudication, or rash.  All other systems reviewed and negative  General: Affect appropriate Healthy:  appears stated age 35: normal Neck supple with no adenopathy JVP normal bilateal bruits no thyromegaly Lungs clear with no wheezing and good diaphragmatic motion Heart:  S1/S2 no murmur, no rub, gallop or click PMI normal Abdomen: benighn, BS positve, no tenderness, no AAA no bruit.  No HSM or HJR Distal pulses intact with no  bruits No edema Neuro non-focal Skin warm and dry No muscular weakness   Current Outpatient Prescriptions  Medication Sig Dispense Refill  . acetaminophen (TYLENOL) 325 MG tablet Take 325 mg by mouth every 6 (six) hours as needed for mild pain.    Marland Kitchen ALPRAZolam (XANAX) 1 MG tablet Take 1 mg by mouth daily as needed for anxiety.     Marland Kitchen amLODipine (NORVASC) 5 MG tablet Take 1 tablet (5 mg total) by mouth daily. 180 tablet 3  . B Complex Vitamins (B-COMPLEX/B-12 PO) Take 1,000 tablets by mouth daily. 2 tabs daily    . CRESTOR 40 MG tablet 20 mg. Takes every 3 days    . gemfibrozil (LOPID) 600 MG tablet Take 600 mg by mouth 2 (two) times daily before a meal.    . ranitidine (ZANTAC) 150 MG tablet Take 150 mg by mouth daily as needed for heartburn. For heartburn     No current facility-administered medications for this visit.    Allergies  Codeine  Electrocardiogram: 11/14  SR rate 74 normal 08/2014  no change NSR normal ECG   Assessment and Plan Bruits:  F/U duplex in December ASA  HTN:  Well controlled.  Continue current medications and low sodium Dash type diet.    Chol:  F/U primary Strongly advised her to see our cholesterol clinic and start PSK9 drug  Glomerulonephritis  Improved labs with primary   Lab Results  Component Value Date   CREATININE 1.28*  12/09/2011     Arachnoid Cyst  Recent CT with stable size and no recurrent UE weakness  GERD  Low carb diet and zantac    F/U with me in December with carotid duplex  [name]

## 2015-02-27 ENCOUNTER — Encounter: Payer: Self-pay | Admitting: Cardiovascular Disease

## 2015-02-27 ENCOUNTER — Ambulatory Visit (INDEPENDENT_AMBULATORY_CARE_PROVIDER_SITE_OTHER): Payer: Medicare Other | Admitting: Cardiovascular Disease

## 2015-02-27 VITALS — BP 142/78 | HR 80 | Ht 66.0 in | Wt 139.0 lb

## 2015-02-27 DIAGNOSIS — R0989 Other specified symptoms and signs involving the circulatory and respiratory systems: Secondary | ICD-10-CM

## 2015-02-27 DIAGNOSIS — I6523 Occlusion and stenosis of bilateral carotid arteries: Secondary | ICD-10-CM | POA: Diagnosis not present

## 2015-02-27 NOTE — Patient Instructions (Addendum)
Your physician wants you to follow-up in: 6 months with Dr Blima Singer will receive a reminder letter in the mail two months in advance. If you don't receive a letter, please call our office to schedule the follow-up appointment.   Your physician recommends that you continue on your current medications as directed. Please refer to the Current Medication list given to you today.    Your physician has requested that you have a carotid duplex. This test is an ultrasound of the carotid arteries in your neck. It looks at blood flow through these arteries that supply the brain with blood. Allow one hour for this exam. There are no restrictions or special instructions.    Thank you for choosing [facility] !

## 2015-07-11 ENCOUNTER — Other Ambulatory Visit (HOSPITAL_COMMUNITY): Payer: Self-pay | Admitting: Family Medicine

## 2015-07-11 DIAGNOSIS — Z1231 Encounter for screening mammogram for malignant neoplasm of breast: Secondary | ICD-10-CM

## 2015-08-20 ENCOUNTER — Other Ambulatory Visit (HOSPITAL_COMMUNITY): Payer: Medicare Other

## 2015-08-28 ENCOUNTER — Ambulatory Visit (HOSPITAL_COMMUNITY)
Admission: RE | Admit: 2015-08-28 | Discharge: 2015-08-28 | Disposition: A | Discharge status: Home / Self Care | Payer: Medicare Other | Source: Ambulatory Visit | Attending: Cardiovascular Disease | Admitting: Cardiovascular Disease

## 2015-08-28 DIAGNOSIS — I6523 Occlusion and stenosis of bilateral carotid arteries: Secondary | ICD-10-CM | POA: Diagnosis not present

## 2015-08-28 DIAGNOSIS — R0989 Other specified symptoms and signs involving the circulatory and respiratory systems: Secondary | ICD-10-CM | POA: Diagnosis not present

## 2015-09-03 ENCOUNTER — Encounter: Payer: Self-pay | Admitting: Cardiovascular Disease

## 2015-09-05 ENCOUNTER — Other Ambulatory Visit: Payer: Self-pay | Admitting: Cardiovascular Disease

## 2015-09-13 NOTE — Progress Notes (Addendum)
Patient ID: Colleen Parks, female   DOB: Jan 29, 1935, 80 y.o.   MRN: [id number]   Colleen Parks is seen today for F/U of elevated lipids, HTN. She has previously been seen by Colleen Parks from [facility].  CRF's include elevated lipid and HTN which is finally Rx. She also had a HbA1c of 7.3 and has begun Rx for type 2 DM. She is active in the garden and bowls She is not having any sscp, palpitations, PND, orthopnea or edema. She had a normal myouve in 2008 with no documented CAD Looking forward to going to [place] to [place]. Some muscular pain in back of right neck Was on zocor 80 mg and we changed to pravachol 80 She subsequently was changed to crestor but this is too expensive for her Chol meds changed by Dr Colleen Parks  Now on lopid and mevacor   Glomerulonephritis in remission  Frustrated by experience at [facility]  "They lost my biopsy"  Then told me they didn't get enough tissue to make diagnosis  Off steroids  Had right brain cyst that caused temporary RUE weakness That is now better   History of LICA stenosis [id number]    dupllex read as greater than 70% Velocities 2.85/.75   Intolerant to multiple statins in past LDL followed by primary and still very high.  2 weeks ago she started taking crestor 20 mg every 3rd day Discussed PSK9 as did her primary but she is terrified to given herself shots   Discussed starting baby aspirin  She has had best friend and brother who had problems with "stents" and does not ever want one   ROS: Denies fever, malais, weight loss, blurry vision, decreased visual acuity, cough, sputum, SOB, hemoptysis, pleuritic pain, palpitaitons, heartburn, abdominal pain, melena, lower extremity edema, claudication, or rash.  All other systems reviewed and negative  General: Affect appropriate Healthy:  appears stated age 11: normal Neck supple with no adenopathy JVP normal bilateal bruits no thyromegaly Lungs clear with no wheezing and good diaphragmatic motion Heart:  S1/S2 no  murmur, no rub, gallop or click PMI normal Abdomen: benighn, BS positve, no tenderness, no AAA no bruit.  No HSM or HJR Distal pulses intact with no bruits No edema Neuro non-focal Skin warm and dry No muscular weakness   Current Outpatient Prescriptions  Medication Sig Dispense Refill  . acetaminophen (TYLENOL) 325 MG tablet Take 325 mg by mouth every 6 (six) hours as needed for mild pain.    Marland Kitchen ALPRAZolam (XANAX) 1 MG tablet Take 1 mg by mouth daily as needed for anxiety.     Marland Kitchen amLODipine (NORVASC) 5 MG tablet TAKE 1 TABLET BY MOUTH DAILY. 30 tablet 6  . B Complex Vitamins (B-COMPLEX/B-12 PO) Take 1,000 tablets by mouth daily. 2 tabs daily    . gemfibrozil (LOPID) 600 MG tablet Take 600 mg by mouth 2 (two) times daily before a meal.    . ranitidine (ZANTAC) 150 MG tablet Take 150 mg by mouth daily as needed for heartburn. For heartburn     No current facility-administered medications for this visit.    Allergies  Codeine  Electrocardiogram: 11/14  SR rate 74 normal 08/2014  no change NSR normal ECG  09/14/15 SR rate 65 normal no change  Assessment and Plan Bruits:  LICA worse this year diastolic under [id number] still fu duplex June 2017    HTN:  Well controlled.  Continue current medications and low sodium Dash type diet.    Chol:  F/U primary Strongly  advised her to see our cholesterol clinic and start PSK9 drug  Glomerulonephritis  Improved labs with primary   Lab Results  Component Value Date   CREATININE 1.28* 12/09/2011     Arachnoid Cyst  Recent CT with stable size and no recurrent UE weakness  GERD  Low carb diet and zantac    F/U 6 months   Colleen Parks

## 2015-09-14 ENCOUNTER — Ambulatory Visit (INDEPENDENT_AMBULATORY_CARE_PROVIDER_SITE_OTHER): Payer: Medicare Other | Admitting: Cardiovascular Disease

## 2015-09-14 ENCOUNTER — Encounter: Payer: Self-pay | Admitting: Cardiovascular Disease

## 2015-09-14 VITALS — BP 160/70 | HR 72 | Ht 66.0 in | Wt 142.0 lb

## 2015-09-14 DIAGNOSIS — I10 Essential (primary) hypertension: Secondary | ICD-10-CM | POA: Diagnosis not present

## 2015-09-14 DIAGNOSIS — E7800 Pure hypercholesterolemia, unspecified: Secondary | ICD-10-CM

## 2015-09-14 DIAGNOSIS — I779 Disorder of arteries and arterioles, unspecified: Secondary | ICD-10-CM

## 2015-09-14 DIAGNOSIS — R0989 Other specified symptoms and signs involving the circulatory and respiratory systems: Secondary | ICD-10-CM | POA: Diagnosis not present

## 2015-09-14 DIAGNOSIS — I739 Peripheral vascular disease, unspecified: Principal | ICD-10-CM

## 2015-09-14 NOTE — Patient Instructions (Signed)
Medication Instructions:  Your physician recommends that you continue on your current medications as directed. Please refer to the Current Medication list given to you today.   Labwork: None Ordered   Testing/Procedures: Your physician has requested that you have a carotid duplex in June 2017 before your appointment with Dr. Johnsie Cancel. This test is an ultrasound of the carotid arteries in your neck. It looks at blood flow through these arteries that supply the brain with blood. Allow one hour for this exam. There are no restrictions or special instructions.   Follow-Up: Your physician wants you to follow-up in: June 2017 with Dr. Johnsie Cancel.  You will receive a reminder letter in the mail two months in advance. If you don't receive a letter, please call our office to schedule the follow-up appointment.   If you need a refill on your cardiac medications before your next appointment, please call your pharmacy.

## 2015-09-19 ENCOUNTER — Ambulatory Visit (HOSPITAL_COMMUNITY)
Admission: RE | Admit: 2015-09-19 | Discharge: 2015-09-19 | Disposition: A | Discharge status: Home / Self Care | Payer: PPO | Source: Ambulatory Visit | Attending: Family Medicine | Admitting: Family Medicine

## 2015-09-19 DIAGNOSIS — Z1231 Encounter for screening mammogram for malignant neoplasm of breast: Secondary | ICD-10-CM

## 2015-10-16 DIAGNOSIS — E782 Mixed hyperlipidemia: Secondary | ICD-10-CM | POA: Diagnosis not present

## 2015-10-16 DIAGNOSIS — N183 Chronic kidney disease, stage 3 (moderate): Secondary | ICD-10-CM | POA: Diagnosis not present

## 2015-10-16 DIAGNOSIS — E119 Type 2 diabetes mellitus without complications: Secondary | ICD-10-CM | POA: Diagnosis not present

## 2015-10-16 DIAGNOSIS — I10 Essential (primary) hypertension: Secondary | ICD-10-CM | POA: Diagnosis not present

## 2015-10-18 DIAGNOSIS — E039 Hypothyroidism, unspecified: Secondary | ICD-10-CM | POA: Diagnosis not present

## 2015-10-18 DIAGNOSIS — E1165 Type 2 diabetes mellitus with hyperglycemia: Secondary | ICD-10-CM | POA: Diagnosis not present

## 2015-10-18 DIAGNOSIS — E782 Mixed hyperlipidemia: Secondary | ICD-10-CM | POA: Diagnosis not present

## 2015-10-18 DIAGNOSIS — I10 Essential (primary) hypertension: Secondary | ICD-10-CM | POA: Diagnosis not present

## 2015-10-18 DIAGNOSIS — E119 Type 2 diabetes mellitus without complications: Secondary | ICD-10-CM | POA: Diagnosis not present

## 2015-11-28 DIAGNOSIS — I776 Arteritis, unspecified: Secondary | ICD-10-CM | POA: Diagnosis not present

## 2015-12-05 DIAGNOSIS — I10 Essential (primary) hypertension: Secondary | ICD-10-CM | POA: Diagnosis not present

## 2015-12-05 DIAGNOSIS — N183 Chronic kidney disease, stage 3 (moderate): Secondary | ICD-10-CM | POA: Diagnosis not present

## 2015-12-05 DIAGNOSIS — R801 Persistent proteinuria, unspecified: Secondary | ICD-10-CM | POA: Diagnosis not present

## 2015-12-05 DIAGNOSIS — I776 Arteritis, unspecified: Secondary | ICD-10-CM | POA: Diagnosis not present

## 2015-12-13 ENCOUNTER — Encounter: Payer: Self-pay | Admitting: Cardiovascular Disease

## 2016-01-09 ENCOUNTER — Encounter: Payer: Self-pay | Admitting: Cardiovascular Disease

## 2016-02-14 ENCOUNTER — Telehealth: Payer: Self-pay | Admitting: Cardiovascular Disease

## 2016-02-14 DIAGNOSIS — R0989 Other specified symptoms and signs involving the circulatory and respiratory systems: Secondary | ICD-10-CM

## 2016-02-14 DIAGNOSIS — I771 Stricture of artery: Secondary | ICD-10-CM

## 2016-02-14 NOTE — Telephone Encounter (Signed)
New Message:    Please call her asap,concerning order for ultrasound.

## 2016-02-14 NOTE — Telephone Encounter (Signed)
Patient wants her carotid study done at [place] in order for patient to have scheduled at that location. Jarrett Soho will call patient to schedule.

## 2016-02-18 ENCOUNTER — Ambulatory Visit (HOSPITAL_COMMUNITY)
Admission: RE | Admit: 2016-02-18 | Discharge: 2016-02-18 | Disposition: A | Discharge status: Home / Self Care | Payer: PPO | Source: Ambulatory Visit | Attending: Cardiovascular Disease | Admitting: Cardiovascular Disease

## 2016-02-18 DIAGNOSIS — I6523 Occlusion and stenosis of bilateral carotid arteries: Secondary | ICD-10-CM | POA: Diagnosis not present

## 2016-02-18 DIAGNOSIS — I771 Stricture of artery: Secondary | ICD-10-CM | POA: Insufficient documentation

## 2016-02-18 DIAGNOSIS — R0989 Other specified symptoms and signs involving the circulatory and respiratory systems: Secondary | ICD-10-CM | POA: Diagnosis not present

## 2016-02-19 ENCOUNTER — Telehealth: Payer: Self-pay

## 2016-02-19 DIAGNOSIS — I6522 Occlusion and stenosis of left carotid artery: Secondary | ICD-10-CM

## 2016-02-19 NOTE — Telephone Encounter (Signed)
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

## 2016-02-19 NOTE — Telephone Encounter (Signed)
Referral placed to [facility] for pt to call to discuss results

## 2016-02-19 NOTE — Telephone Encounter (Signed)
I reached patient through husband's cell phone.I gave her results of carotid US and told her to expect call from [facility] to schedule consultation,copy of report sent to pcp

## 2016-02-26 ENCOUNTER — Other Ambulatory Visit: Payer: Self-pay | Admitting: *Deleted

## 2016-02-26 ENCOUNTER — Encounter (HOSPITAL_COMMUNITY): Payer: Medicare Other

## 2016-02-26 DIAGNOSIS — I6523 Occlusion and stenosis of bilateral carotid arteries: Secondary | ICD-10-CM

## 2016-02-26 DIAGNOSIS — E119 Type 2 diabetes mellitus without complications: Secondary | ICD-10-CM | POA: Diagnosis not present

## 2016-02-27 ENCOUNTER — Ambulatory Visit: Payer: PPO | Admitting: Cardiovascular Disease

## 2016-03-02 NOTE — Progress Notes (Signed)
Patient ID: Colleen Parks, female   DOB: 1934/10/30, 81 y.o.   MRN: [id number]   Colleen Parks is seen today for F/U of elevated lipids, HTN. She has previously been seen by [facility] from [facility].  CRF's include elevated lipid and HTN which is finally Rx. She also had a HbA1c of 7.3 and has begun Rx for type 2 DM. She is active in the garden and bowls She is not having any sscp, palpitations, PND, orthopnea or edema. She had a normal myouve in 2008 with no documented CAD Looking forward to going to [place] to [place]. Some muscular pain in back of right neck Was on zocor 80 mg and we changed to pravachol 80 She subsequently was changed to crestor but this is too expensive for her Chol meds changed by [facility]  Now on lopid and mevacor   Glomerulonephritis in remission  Frustrated by experience at [facility]  "They lost my biopsy"  Then told me they didn't get enough tissue to make diagnosis  Off steroids  Had right brain cyst that caused temporary RUE weakness That is now better   Most recent carotid duplex reviewed [id number]  [id number] stenosis LICA LEFT CAROTID ARTERY: Heterogeneous plaque in the distal common carotid artery extending into the proximal internal and external carotid arteries. The peak systolic velocity criteria the peak systolic velocity falls into the 70- 99% range. The velocities are higher today than previously noted. Additionally, there is hemodynamically significant stenosis of the external carotid artery.  ICA: 417/123 cm/sec  Intolerant to multiple statins in past LDL followed by primary and still very high.  2 weeks ago she started taking crestor 20 mg every 3rd day Discussed PSK9 as did her primary but she is terrified to given herself shots   Discussed starting baby aspirin  She has had best friend and brother who had problems with "stents" and does not ever want one   ROS: Denies fever, malais, weight loss, blurry vision, decreased visual acuity, cough, sputum, SOB,  hemoptysis, pleuritic pain, palpitaitons, heartburn, abdominal pain, melena, lower extremity edema, claudication, or rash.  All other systems reviewed and negative  General: Affect appropriate Healthy:  appears stated age 81: normal Neck supple with no adenopathy JVP normal bilateral  bruits no thyromegaly Lungs clear with no wheezing and good diaphragmatic motion Heart:  S1/S2 no murmur, no rub, gallop or click PMI normal Abdomen: benighn, BS positve, no tenderness, no AAA no bruit.  No HSM or HJR Distal pulses intact with no bruits No edema Neuro non-focal Skin warm and dry No muscular weakness   Current Outpatient Prescriptions  Medication Sig Dispense Refill  . acetaminophen (TYLENOL) 325 MG tablet Take 325 mg by mouth every 6 (six) hours as needed for mild pain.    Marland Kitchen ALPRAZolam (XANAX) 1 MG tablet Take 1 mg by mouth daily as needed for anxiety.     Marland Kitchen amLODipine (NORVASC) 5 MG tablet TAKE 1 TABLET BY MOUTH DAILY. 30 tablet 6  . B Complex Vitamins (B-COMPLEX/B-12 PO) Take 1,000 tablets by mouth daily. 2 tabs daily    . FLUoxetine (PROZAC) 10 MG capsule Take 10 mg by mouth daily.    Marland Kitchen gemfibrozil (LOPID) 600 MG tablet Take 600 mg by mouth 2 (two) times daily before a meal.    . ranitidine (ZANTAC) 150 MG tablet Take 150 mg by mouth daily as needed for heartburn. For heartburn     No current facility-administered medications for this visit.    Allergies  Codeine  Electrocardiogram: 11/14  SR rate 74 normal 08/2014  no change NSR normal ECG  09/14/15 SR rate 65 normal no change  Assessment and Plan Bruits:   Progression of LICA now high grade [id number] has been referred to [facility]  HTN:  Well controlled.  Continue current medications and low sodium Dash type diet.    Chol:  F/U primary Strongly advised her to see our cholesterol clinic and start PSK9 drug  Glomerulonephritis  Improved labs with primary   Lab Results  Component Value Date   CREATININE 1.28*  12/09/2011     Arachnoid Cyst  Recent CT with stable size and no recurrent UE weakness  GERD  Low carb diet and zantac    F/U 6 months   Colleen Parks

## 2016-03-03 ENCOUNTER — Encounter: Payer: Self-pay | Admitting: Cardiovascular Disease

## 2016-03-03 ENCOUNTER — Ambulatory Visit (INDEPENDENT_AMBULATORY_CARE_PROVIDER_SITE_OTHER): Payer: PPO | Admitting: Cardiovascular Disease

## 2016-03-03 VITALS — BP 112/60 | HR 64 | Ht 66.0 in | Wt 134.1 lb

## 2016-03-03 DIAGNOSIS — E1165 Type 2 diabetes mellitus with hyperglycemia: Secondary | ICD-10-CM | POA: Diagnosis not present

## 2016-03-03 DIAGNOSIS — I6522 Occlusion and stenosis of left carotid artery: Secondary | ICD-10-CM

## 2016-03-03 DIAGNOSIS — N183 Chronic kidney disease, stage 3 (moderate): Secondary | ICD-10-CM | POA: Diagnosis not present

## 2016-03-03 DIAGNOSIS — E782 Mixed hyperlipidemia: Secondary | ICD-10-CM | POA: Diagnosis not present

## 2016-03-03 DIAGNOSIS — I10 Essential (primary) hypertension: Secondary | ICD-10-CM

## 2016-03-03 NOTE — Patient Instructions (Signed)
Medication Instructions:  Your physician recommends that you continue on your current medications as directed. Please refer to the Current Medication list given to you today.   Labwork: None Ordered   Testing/Procedures: None Ordered   Follow-Up: Your physician recommends that you schedule a follow-up appointment in: 3 months with Dr. Johnsie Cancel   If you need a refill on your cardiac medications before your next appointment, please call your pharmacy.   Thank you for choosing [facility]! Christen Bame, RN [phone]

## 2016-03-10 ENCOUNTER — Encounter: Payer: Self-pay | Admitting: Vascular Surgery

## 2016-03-19 ENCOUNTER — Ambulatory Visit (INDEPENDENT_AMBULATORY_CARE_PROVIDER_SITE_OTHER): Payer: PPO | Admitting: Vascular Surgery

## 2016-03-19 ENCOUNTER — Ambulatory Visit (HOSPITAL_COMMUNITY)
Admission: RE | Admit: 2016-03-19 | Discharge: 2016-03-19 | Disposition: A | Discharge status: Home / Self Care | Payer: PPO | Source: Ambulatory Visit | Attending: Vascular Surgery | Admitting: Vascular Surgery

## 2016-03-19 ENCOUNTER — Encounter: Payer: Self-pay | Admitting: Vascular Surgery

## 2016-03-19 VITALS — BP 172/69 | HR 68 | Ht 66.0 in | Wt 135.1 lb

## 2016-03-19 DIAGNOSIS — I10 Essential (primary) hypertension: Secondary | ICD-10-CM | POA: Insufficient documentation

## 2016-03-19 DIAGNOSIS — E119 Type 2 diabetes mellitus without complications: Secondary | ICD-10-CM | POA: Insufficient documentation

## 2016-03-19 DIAGNOSIS — K219 Gastro-esophageal reflux disease without esophagitis: Secondary | ICD-10-CM | POA: Diagnosis not present

## 2016-03-19 DIAGNOSIS — F419 Anxiety disorder, unspecified: Secondary | ICD-10-CM | POA: Insufficient documentation

## 2016-03-19 DIAGNOSIS — I6522 Occlusion and stenosis of left carotid artery: Secondary | ICD-10-CM | POA: Insufficient documentation

## 2016-03-19 DIAGNOSIS — E7800 Pure hypercholesterolemia, unspecified: Secondary | ICD-10-CM | POA: Diagnosis not present

## 2016-03-19 DIAGNOSIS — I6523 Occlusion and stenosis of bilateral carotid arteries: Secondary | ICD-10-CM

## 2016-03-19 LAB — VAS US CAROTID
LCCAPDIAS: 20 cm/s
LCCAPSYS: 122 cm/s
LEFT ECA DIAS: 0 cm/s
LEFT VERTEBRAL DIAS: 0 cm/s
LICADDIAS: -23 cm/s
Left CCA dist dias: -28 cm/s
Left CCA dist sys: -187 cm/s
Left ICA dist sys: -103 cm/s
Left ICA prox dias: -77 cm/s
Left ICA prox sys: -347 cm/s

## 2016-03-19 NOTE — Progress Notes (Signed)
[facility] [place]  Patient name: Colleen Parks MRN: [id number] DOB: 1935/06/29 Sex: female  REASON FOR CONSULT: Evaluate for left carotid stenosis. Referred by Dr. [name].   HPI: Colleen Parks is a 81 y.o. female, who is referred for evaluation of a left carotid stenosis. She is left-handed. She denies any history of stroke, TIAs, expressive or receptive aphasia, or amaurosis fugax.  I have reviewed the records that were sent from Dr. Kyla Balzarine office. She was seen on 09/14/2015. At that time she had well-controlled hypertension. Patient had a carotid bruit and duplex scan was recommended.  She is on 81 mg of aspirin a day. She does not tolerate statins.  She is very active. Her favorite activities are bolling and to work in the garden. It also sounds like she eats a very nutritional diet.   Past Medical History  Diagnosis Date  . Hypercholesterolemia   . RAS (renal artery stenosis) ([place])   . Subclavian arterial stenosis ([place])   . HTN (hypertension)   . Diabetes mellitus     states she is no longer treated for this  . GERD (gastroesophageal reflux disease)   . Anxiety   . 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 Glomerulonephritis   . Cancer [facility])      left breast    Family History  Problem Relation Age of Onset  . Coronary artery disease      positive for premature- on fathers side  . Anesthesia problems Neg Hx   . Hypotension Neg Hx   . Malignant hyperthermia Neg Hx   . Pseudochol deficiency Neg Hx     SOCIAL HISTORY: Social History   Social History  . Marital Status: Married    Spouse Name: N/A  . Number of Children: N/A  . Years of Education: N/A   Occupational History  . Not on file.   Social History Main Topics  . Smoking status: Former Smoker -- 0.50 packs/day for 4 years    Types: Cigarettes    Quit date: 12/09/1958  . Smokeless tobacco: Not on file     Comment: non-smoker  . Alcohol Use: No  . Drug Use: No  .  Sexual Activity: Yes    Birth Control/ Protection: Post-menopausal   Other Topics Concern  . Not on file   Social History Narrative   Married, active. Husband, Ernie Hew, pt. With CABG 2010.     Allergies  Allergen Reactions  . Codeine Nausea And Vomiting    Current Outpatient Prescriptions  Medication Sig Dispense Refill  . acetaminophen (TYLENOL) 325 MG tablet Take 325 mg by mouth every 6 (six) hours as needed for mild pain.    Marland Kitchen amLODipine (NORVASC) 5 MG tablet TAKE 1 TABLET BY MOUTH DAILY. 30 tablet 6  . B Complex Vitamins (B-COMPLEX/B-12 PO) Take 1,000 tablets by mouth daily. 2 tabs daily    . FLUoxetine (PROZAC) 10 MG capsule Take 10 mg by mouth daily. Reported on 03/19/2016    . gemfibrozil (LOPID) 600 MG tablet Take 600 mg by mouth 2 (two) times daily before a meal.    . ranitidine (ZANTAC) 150 MG tablet Take 150 mg by mouth daily as needed for heartburn. For heartburn    . ALPRAZolam (XANAX) 1 MG tablet Take 1 mg by mouth daily as needed for anxiety. Reported on 03/19/2016     No current facility-administered medications for this visit.    REVIEW OF SYSTEMS:  [X]  denotes positive finding, [ ]   denotes negative finding Cardiac  Comments:  Chest pain or chest pressure:    Shortness of breath upon exertion:    Short of breath when lying flat:    Irregular heart rhythm:        Vascular    Pain in calf, thigh, or hip brought on by ambulation:    Pain in feet at night that wakes you up from your sleep:     Blood clot in your veins:    Leg swelling:         Pulmonary    Oxygen at home:    Productive cough:     Wheezing:         Neurologic    Sudden weakness in arms or legs:     Sudden numbness in arms or legs:     Sudden onset of difficulty speaking or slurred speech:    Temporary loss of vision in one eye:     Problems with dizziness:         Gastrointestinal    Blood in stool:     Vomited blood:         Genitourinary    Burning when urinating:     Blood in  urine:        Psychiatric    Major depression:         Hematologic    Bleeding problems:    Problems with blood clotting too easily:        Skin    Rashes or ulcers:        Constitutional    Fever or chills:      PHYSICAL EXAM: Filed Vitals:   03/19/16 0831 03/19/16 0841  BP: 180/69 172/69  Pulse: 68   Height: 5\' 6"  (1.676 m)   Weight: 135 lb 1.6 oz (61.281 kg)   SpO2: 99%     GENERAL: The patient is a well-nourished female, in no acute distress. The vital signs are documented above. CARDIAC: There is a regular rate and rhythm.  VASCULAR: She has bilateral carotid bruits. Both feet are warm and well-perfused. PULMONARY: There is good air exchange bilaterally without wheezing or rales. ABDOMEN: Soft and non-tender with normal pitched bowel sounds.  MUSCULOSKELETAL: There are no major deformities or cyanosis. NEUROLOGIC: No focal weakness or paresthesias are detected. SKIN: There are no ulcers or rashes noted. PSYCHIATRIC: The patient has a normal affect.  DATA:   CAROTID DUPLEX: I have reviewed the carotid duplex scan that was done by radiology. There was a less than 50-69% right carotid stenosis. On the left side peak systolic velocity was [id number] with an end-diastolic loss 1 23 cm/s. Ratio was 2.8. This would suggested a greater than 70% left carotid stenosis.  LIMITED LEFT CAROTID DUPLEX: I have independently interpreted the Limited left carotid duplex scan in our office today. Peak systolic velocity is 123456 cm/s on the left with end-diastolic velocity of 92 cm/s.  MEDICAL ISSUES:  ASYMPTOMATIC 60-79% LEFT CAROTID STENOSIS: In our office today the end-diastolic velocity was only 92 cm/s. The ICA/CCA ratio was 1.9. Although her peak systolic velocity is elevated, I put more weight on the end-diastolic velocity and for this reason I think the stenosis is less than 80% on the left. For this reason I have recommended a follow up duplex in 6 months. She knows to continue taking  her aspirin. If the stenosis progresses to greater than 80% and we would consider left carotid endarterectomy.   [name] [facility]  [facility] [employer] [phone]

## 2016-03-21 ENCOUNTER — Ambulatory Visit (HOSPITAL_COMMUNITY)
Admission: RE | Admit: 2016-03-21 | Discharge: 2016-03-21 | Disposition: A | Discharge status: Home / Self Care | Payer: PPO | Source: Ambulatory Visit | Attending: Cardiology | Admitting: Cardiology

## 2016-04-26 IMAGING — US US CAROTID DUPLEX BILAT
1 series · 13 of 24 positions shown · non-contrast
Comparison: 08/22/2014

CLINICAL DATA: Bilateral asymptomatic carotid bruits

EXAM:
BILATERAL CAROTID DUPLEX ULTRASOUND
TECHNIQUE: Gray scale imaging, color Doppler and duplex ultrasound were
performed of bilateral carotid and vertebral arteries in the neck.

[Series 1: us carotid duplex bilat · 0.06mm/px · 13 of 72 slices shown]
[im 1/72]
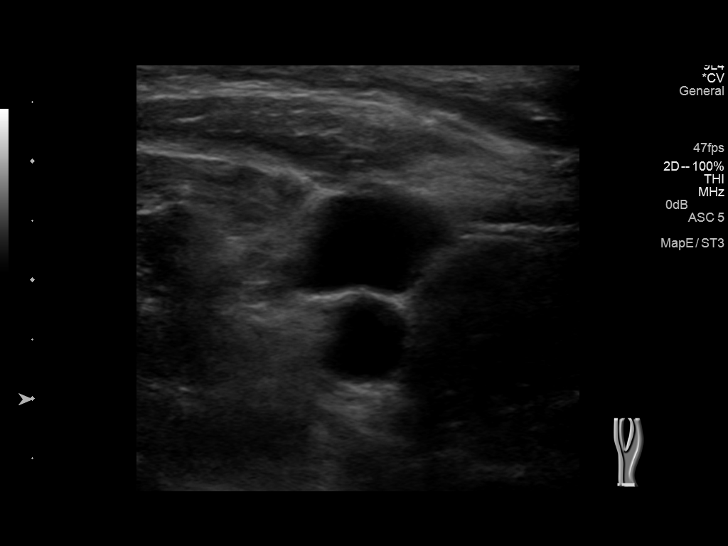
[im 7/72]
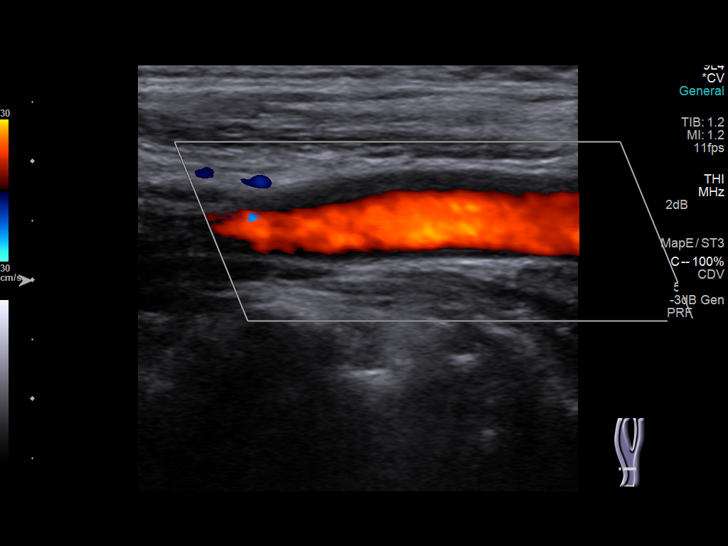
[im 13/72]
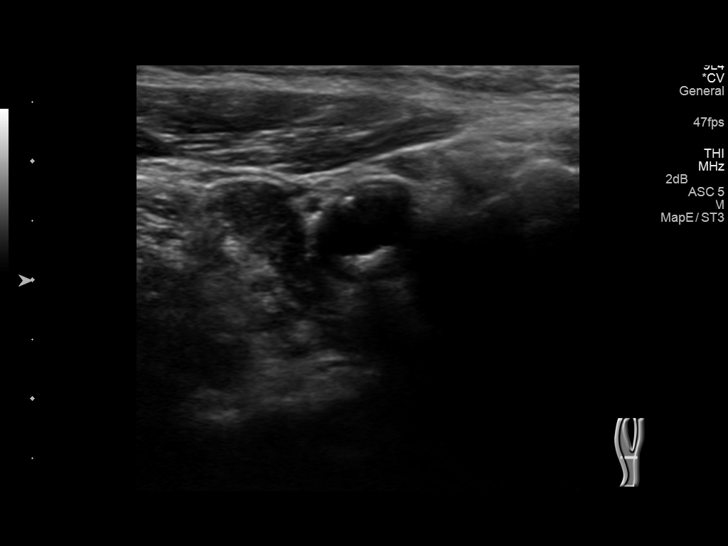
[im 19/72]
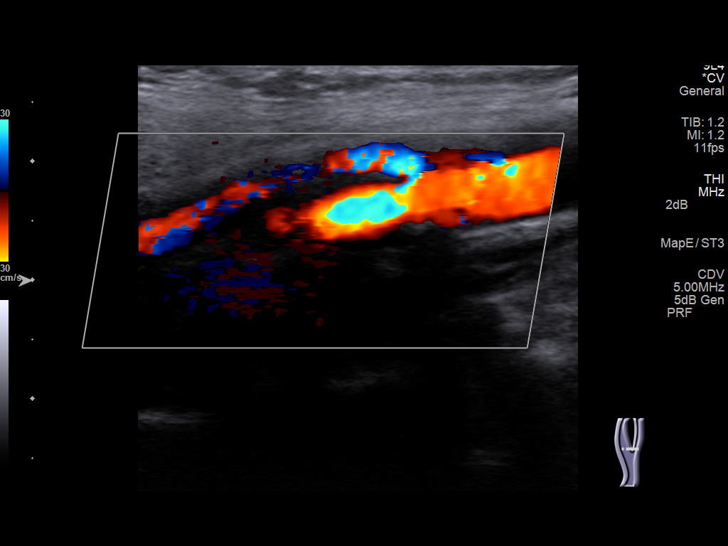
[im 25/72]
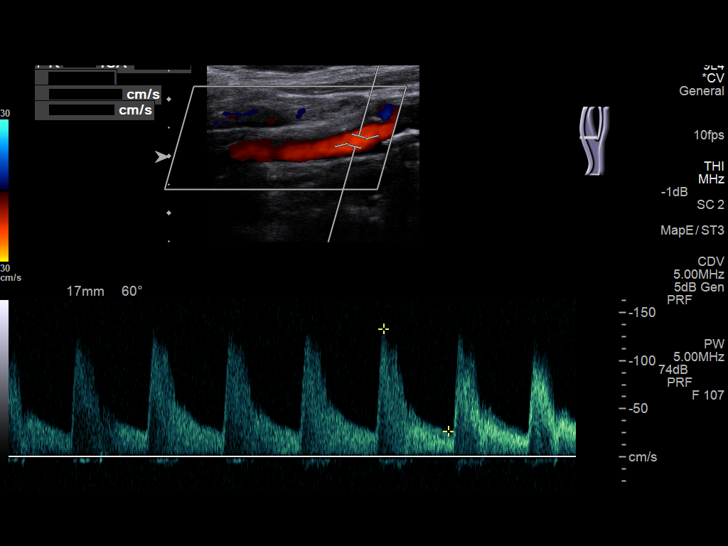
[im 31/72]
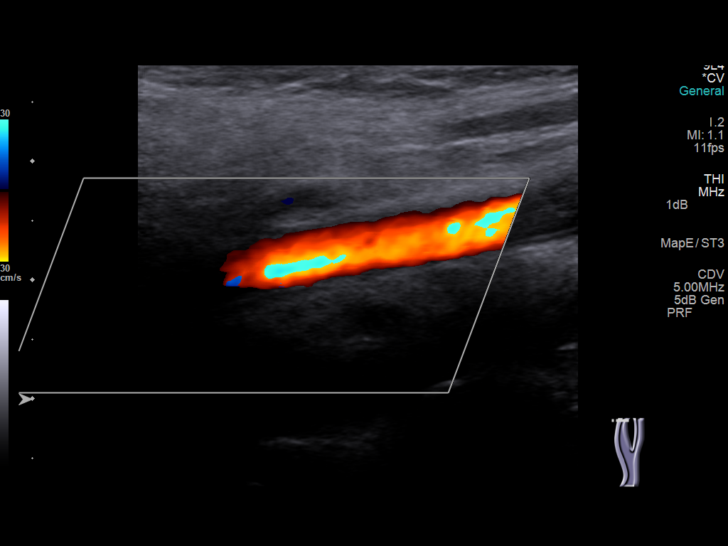
[im 38/72]
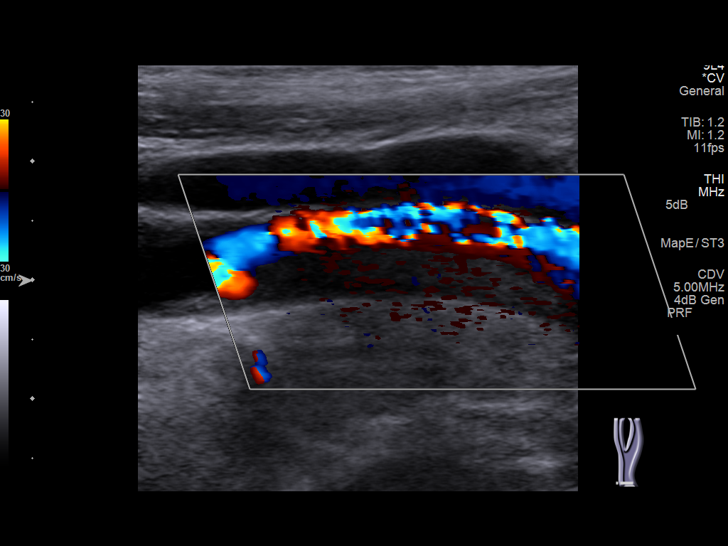
[im 41/72]
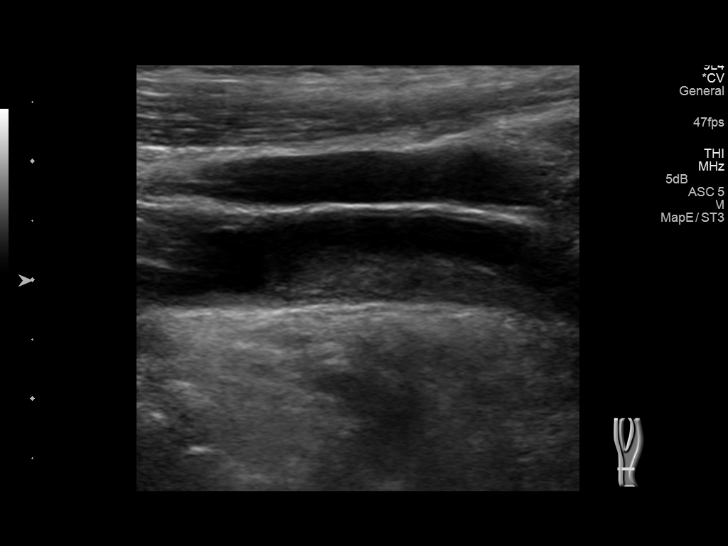
[im 47/72]
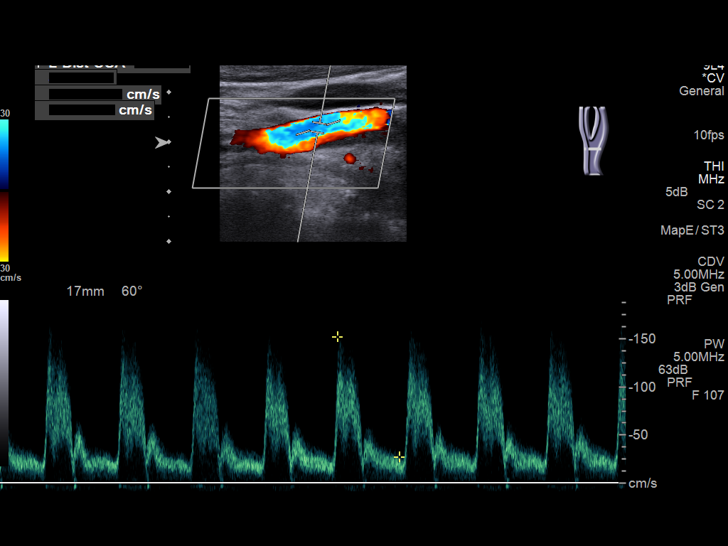
[im 53/72]
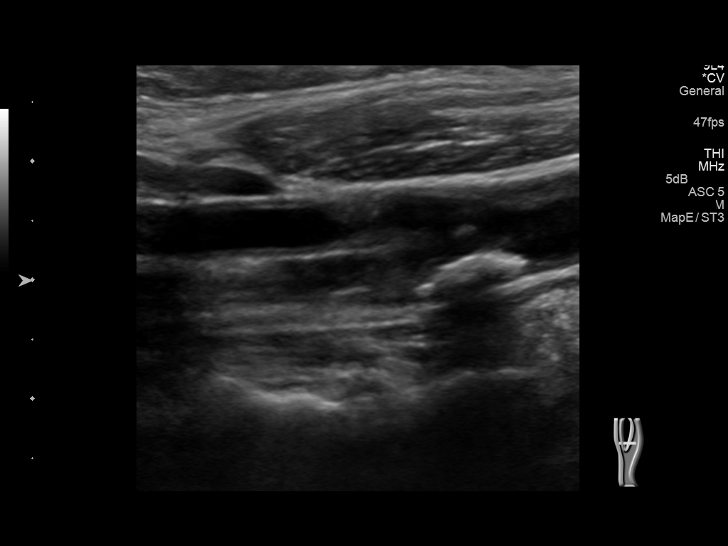
[im 59/72]
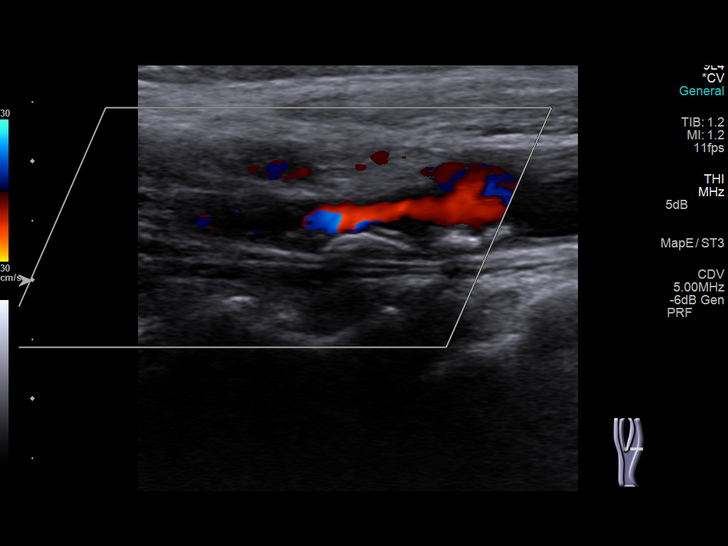
[im 65/72]
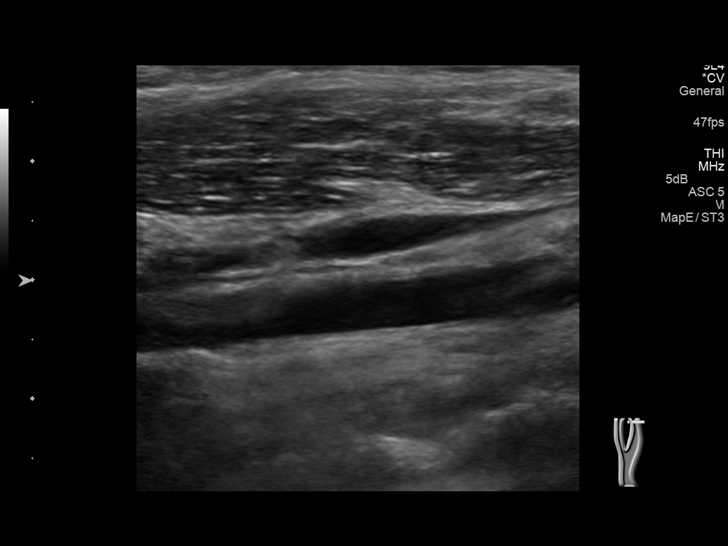
[im 72/72]
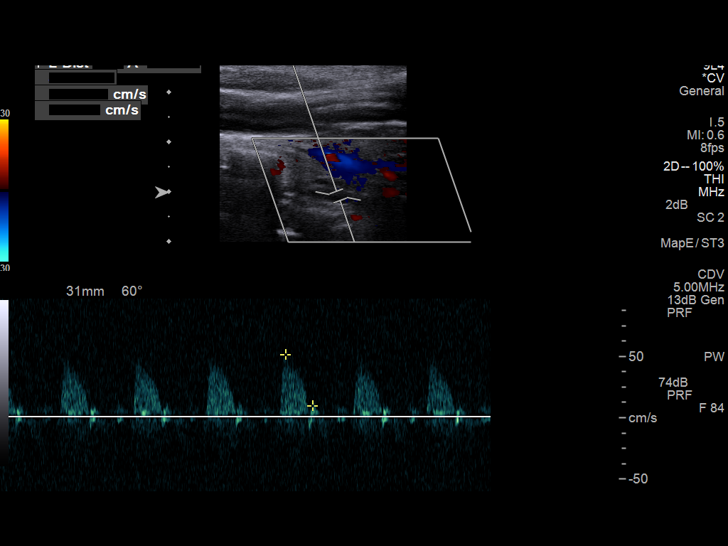

[13 of 24 positions shown; findings below may reference images not displayed]

FINDINGS: Criteria: Quantification of carotid stenosis is based on velocity
parameters that correlate the residual internal carotid diameter
with NASCET-based stenosis levels, using the diameter of the distal
internal carotid lumen as the denominator for stenosis measurement.

The following velocity measurements were obtained:

RIGHT

ICA:  133/27 cm/sec

CCA:  127/19 cm/sec

SYSTOLIC ICA/CCA RATIO:

DIASTOLIC ICA/CCA RATIO:

ECA:  324 cm/sec

LEFT

ICA:  281/75 cm/sec

CCA:  172/35 cm/sec

SYSTOLIC ICA/CCA RATIO:

DIASTOLIC ICA/CCA RATIO:

ECA:  229 cm/sec

RIGHT CAROTID ARTERY: Mild heterogeneous right carotid bifurcation
atherosclerosis with intimal thickening and scattered plaque
formation. No hemodynamically significant right ICA stenosis,
velocity elevation, or turbulent flow. Degree of now remains less
than 50%.

RIGHT VERTEBRAL ARTERY:  Antegrade

LEFT CAROTID ARTERY: Heterogeneous hypoechoic and scattered
echogenic plaque formation of the left carotid system. This results
in luminal narrowing of the CCA and proximal left ICA. In the
proximal ICA region, there is turbulent flow with mild spectral
broadening. Left mid ICA velocity elevation measures 281/75 cm/sec,
slightly higher than before. By ultrasound criteria, proximal left
ICA stenosis appears to have progressed and meets criteria for
greater than 70%.

LEFT VERTEBRAL ARTERY:  Antegrade
IMPRESSION: Left greater than right carotid atherosclerosis.

Progression of left carotid bifurcation atherosclerosis. Left ICA
stenosis is now estimated greater than 70%

Minor right ICA narrowing, less than 50%.

Patent antegrade vertebral flow bilaterally.

## 2016-05-01 ENCOUNTER — Encounter: Payer: Self-pay | Admitting: Cardiovascular Disease

## 2016-05-08 DIAGNOSIS — I776 Arteritis, unspecified: Secondary | ICD-10-CM | POA: Diagnosis not present

## 2016-05-08 DIAGNOSIS — N184 Chronic kidney disease, stage 4 (severe): Secondary | ICD-10-CM | POA: Diagnosis not present

## 2016-05-13 DIAGNOSIS — I10 Essential (primary) hypertension: Secondary | ICD-10-CM | POA: Diagnosis not present

## 2016-05-13 DIAGNOSIS — I779 Disorder of arteries and arterioles, unspecified: Secondary | ICD-10-CM | POA: Diagnosis not present

## 2016-05-13 DIAGNOSIS — N184 Chronic kidney disease, stage 4 (severe): Secondary | ICD-10-CM | POA: Diagnosis not present

## 2016-05-13 DIAGNOSIS — R801 Persistent proteinuria, unspecified: Secondary | ICD-10-CM | POA: Diagnosis not present

## 2016-05-21 ENCOUNTER — Other Ambulatory Visit: Payer: Self-pay | Admitting: Cardiovascular Disease

## 2016-05-27 ENCOUNTER — Ambulatory Visit: Payer: PPO | Admitting: Cardiovascular Disease

## 2016-06-16 DIAGNOSIS — L570 Actinic keratosis: Secondary | ICD-10-CM | POA: Diagnosis not present

## 2016-06-16 DIAGNOSIS — D0462 Carcinoma in situ of skin of left upper limb, including shoulder: Secondary | ICD-10-CM | POA: Diagnosis not present

## 2016-06-16 DIAGNOSIS — X32XXXD Exposure to sunlight, subsequent encounter: Secondary | ICD-10-CM | POA: Diagnosis not present

## 2016-07-01 DIAGNOSIS — Z23 Encounter for immunization: Secondary | ICD-10-CM | POA: Diagnosis not present

## 2016-07-01 DIAGNOSIS — I10 Essential (primary) hypertension: Secondary | ICD-10-CM | POA: Diagnosis not present

## 2016-07-01 DIAGNOSIS — E782 Mixed hyperlipidemia: Secondary | ICD-10-CM | POA: Diagnosis not present

## 2016-07-01 DIAGNOSIS — E1165 Type 2 diabetes mellitus with hyperglycemia: Secondary | ICD-10-CM | POA: Diagnosis not present

## 2016-07-03 DIAGNOSIS — I10 Essential (primary) hypertension: Secondary | ICD-10-CM | POA: Diagnosis not present

## 2016-07-03 DIAGNOSIS — E1165 Type 2 diabetes mellitus with hyperglycemia: Secondary | ICD-10-CM | POA: Diagnosis not present

## 2016-07-03 DIAGNOSIS — E782 Mixed hyperlipidemia: Secondary | ICD-10-CM | POA: Diagnosis not present

## 2016-07-04 DIAGNOSIS — C50012 Malignant neoplasm of nipple and areola, left female breast: Secondary | ICD-10-CM | POA: Diagnosis not present

## 2016-07-15 DIAGNOSIS — I776 Arteritis, unspecified: Secondary | ICD-10-CM | POA: Diagnosis not present

## 2016-07-15 DIAGNOSIS — R801 Persistent proteinuria, unspecified: Secondary | ICD-10-CM | POA: Diagnosis not present

## 2016-07-15 DIAGNOSIS — N184 Chronic kidney disease, stage 4 (severe): Secondary | ICD-10-CM | POA: Diagnosis not present

## 2016-07-15 DIAGNOSIS — I10 Essential (primary) hypertension: Secondary | ICD-10-CM | POA: Diagnosis not present

## 2016-07-28 DIAGNOSIS — L570 Actinic keratosis: Secondary | ICD-10-CM | POA: Diagnosis not present

## 2016-07-28 DIAGNOSIS — X32XXXD Exposure to sunlight, subsequent encounter: Secondary | ICD-10-CM | POA: Diagnosis not present

## 2016-07-28 DIAGNOSIS — Z08 Encounter for follow-up examination after completed treatment for malignant neoplasm: Secondary | ICD-10-CM | POA: Diagnosis not present

## 2016-07-28 DIAGNOSIS — D0461 Carcinoma in situ of skin of right upper limb, including shoulder: Secondary | ICD-10-CM | POA: Diagnosis not present

## 2016-07-28 DIAGNOSIS — Z85828 Personal history of other malignant neoplasm of skin: Secondary | ICD-10-CM | POA: Diagnosis not present

## 2016-08-18 ENCOUNTER — Other Ambulatory Visit (HOSPITAL_COMMUNITY): Payer: Self-pay | Admitting: Family Medicine

## 2016-08-18 DIAGNOSIS — Z1231 Encounter for screening mammogram for malignant neoplasm of breast: Secondary | ICD-10-CM

## 2016-09-22 ENCOUNTER — Ambulatory Visit (HOSPITAL_COMMUNITY)
Admission: RE | Admit: 2016-09-22 | Discharge: 2016-09-22 | Disposition: A | Discharge status: Home / Self Care | Payer: PPO | Source: Ambulatory Visit | Attending: Family Medicine | Admitting: Family Medicine

## 2016-09-22 DIAGNOSIS — Z1231 Encounter for screening mammogram for malignant neoplasm of breast: Secondary | ICD-10-CM | POA: Diagnosis not present

## 2016-09-24 ENCOUNTER — Encounter (HOSPITAL_COMMUNITY): Payer: PPO

## 2016-09-24 ENCOUNTER — Ambulatory Visit: Payer: PPO | Admitting: Vascular Surgery

## 2016-10-06 NOTE — Progress Notes (Signed)
Patient ID: Colleen Parks, female   DOB: Jan 15, 1935, 82 y.o.   MRN: [id number]   [place] is seen today for F/U of elevated lipids, HTN. She has previously been seen by Helayne Seminole from [facility].  CRF's include elevated lipid and HTN which is finally Rx. She also had a HbA1c of 7.3 and has begun Rx for type 2 DM. She is active in the garden and bowls She is not having any sscp, palpitations, PND, orthopnea or edema. She had a normal myouve in 2008 with no documented CAD Looking forward to going to [place] to bowl. Some muscular pain in back of right neck Was on zocor 80 mg and we changed to pravachol 80 She subsequently was changed to crestor but this is too expensive for her Chol meds changed by Dr Karie Kirks  Now on lopid and mevacor   Glomerulonephritis in remission  Frustrated by experience at [facility]  "They lost my biopsy"  Then told me they didn't get enough tissue to make diagnosis  Off steroids  Had right brain cyst that caused temporary RUE weakness That is now better   Most recent carotid duplex reviewed 11/20/08  >17% stenosis LICA LEFT CAROTID ARTERY: Heterogeneous plaque in the distal common carotid artery extending into the proximal internal and external carotid arteries. The peak systolic velocity criteria the peak systolic velocity falls into the 70- 99% range. The velocities are higher today than previously noted. Additionally, there is hemodynamically significant stenosis of the external carotid artery.  ICA: 417/123 cm/sec  Seen by Dr Doren Custard [facility] and he repeated duplex and felt it was less than 80% recommended Duplex in 6 months  Intolerant to multiple statins in past LDL followed by primary and still very high.  2 weeks ago she started taking crestor 20 mg every 3rd day Discussed PSK9 as did her primary but she is terrified to given herself shots   Dr Doren Custard saw her yesterday with duplex and now feels that left is greater than 80%   ROS: Denies fever, malais, weight loss,  blurry vision, decreased visual acuity, cough, sputum, SOB, hemoptysis, pleuritic pain, palpitaitons, heartburn, abdominal pain, melena, lower extremity edema, claudication, or rash.  All other systems reviewed and negative  General: Affect appropriate Healthy:  appears stated age 20: normal Neck supple with no adenopathy JVP normal bilateral  bruits no thyromegaly Lungs clear with no wheezing and good diaphragmatic motion Heart:  S1/S2 no murmur, no rub, gallop or click PMI normal Abdomen: benighn, BS positve, no tenderness, no AAA no bruit.  No HSM or HJR Distal pulses intact with no bruits No edema Neuro non-focal Skin warm and dry No muscular weakness   Current Outpatient Prescriptions  Medication Sig Dispense Refill  . acetaminophen (TYLENOL) 325 MG tablet Take 325 mg by mouth every 6 (six) hours as needed for mild pain.    Marland Kitchen amLODipine (NORVASC) 5 MG tablet TAKE 1 TABLET BY MOUTH DAILY. 30 tablet 8  . aspirin EC 81 MG tablet Take 81 mg by mouth daily.    . B Complex Vitamins (B-COMPLEX/B-12 PO) Take 1,000 tablets by mouth daily. 2 tabs daily    . FLUoxetine (PROZAC) 10 MG capsule Take 10 mg by mouth daily. Reported on 03/19/2016    . gemfibrozil (LOPID) 600 MG tablet Take 600 mg by mouth 2 (two) times daily before a meal.    . Omega-3 Fatty Acids (FISH OIL PO) Take by mouth daily.    . ranitidine (ZANTAC) 150 MG tablet Take  150 mg by mouth daily as needed for heartburn. For heartburn     No current facility-administered medications for this visit.     Allergies  Codeine  Electrocardiogram: 11/14  SR rate 74 normal 08/2014  no change NSR normal ECG  09/14/15 SR rate 65 normal no change  10/16/16  SR rate 74  Normal   Assessment and Plan Carotid: > 80% by our duplex and now [facility]  Patient prefers to postpone surgery until June after senior games. She understants 4% risk of stroke/year  Continue ASA Ok To have CEA in June   HTN:  Well controlled.  Continue current  medications and low sodium Dash type diet.    Chol:  F/U primary Strongly advised her to see our cholesterol clinic and start PSK9 drug  Glomerulonephritis  Improved labs with primary   Lab Results  Component Value Date   CREATININE 1.28 (H) 12/09/2011   BUN 39 (H) 12/09/2011   NA 143 12/09/2011   K 4.5 12/09/2011   CL 101 12/09/2011   CO2 32 12/09/2011     Arachnoid Cyst  Recent CT with stable size and no recurrent UE weakness  GERD  Low carb diet and zantac    F/U 6 months   [name]

## 2016-10-09 ENCOUNTER — Other Ambulatory Visit: Payer: Self-pay

## 2016-10-09 DIAGNOSIS — I6529 Occlusion and stenosis of unspecified carotid artery: Secondary | ICD-10-CM

## 2016-10-13 ENCOUNTER — Encounter: Payer: Self-pay | Admitting: Vascular Surgery

## 2016-10-15 ENCOUNTER — Ambulatory Visit (HOSPITAL_COMMUNITY)
Admission: RE | Admit: 2016-10-15 | Discharge: 2016-10-15 | Disposition: A | Discharge status: Home / Self Care | Payer: PPO | Source: Ambulatory Visit | Attending: Vascular Surgery | Admitting: Vascular Surgery

## 2016-10-15 ENCOUNTER — Ambulatory Visit (INDEPENDENT_AMBULATORY_CARE_PROVIDER_SITE_OTHER): Payer: PPO | Admitting: Vascular Surgery

## 2016-10-15 ENCOUNTER — Encounter: Payer: Self-pay | Admitting: Vascular Surgery

## 2016-10-15 VITALS — BP 169/74 | HR 71 | Temp 97.2°F | Resp 16 | Ht 66.0 in | Wt 137.0 lb

## 2016-10-15 DIAGNOSIS — I6523 Occlusion and stenosis of bilateral carotid arteries: Secondary | ICD-10-CM

## 2016-10-15 DIAGNOSIS — I6529 Occlusion and stenosis of unspecified carotid artery: Secondary | ICD-10-CM | POA: Diagnosis not present

## 2016-10-15 LAB — VAS US CAROTID
LCCADDIAS: 21 cm/s
LCCAPSYS: 89 cm/s
LEFT ECA DIAS: 16 cm/s
LICADSYS: 37 cm/s
LICAPSYS: 447 cm/s
Left CCA dist sys: 153 cm/s
Left CCA prox dias: 16 cm/s
Left ICA prox dias: 117 cm/s
RCCAPSYS: 94 cm/s
RIGHT CCA MID DIAS: 18 cm/s
RIGHT ECA DIAS: -8 cm/s
RIGHT VERTEBRAL DIAS: -21 cm/s
Right CCA prox dias: 15 cm/s
Right cca dist sys: -119 cm/s

## 2016-10-15 NOTE — Progress Notes (Signed)
Patient name: Colleen Parks MRN: [id number] DOB: [birth year] Sex: female  REASON FOR VISIT: Follow up of bilateral carotid disease.  HPI: Colleen Parks is a 82 y.o. female who I saw in consultation on 03/19/2016 with a left carotid stenosis. At that time she had an asymptomatic 60-79% left carotid stenosis. I recommended a follow up duplex scan in 6 months.   Since I saw her last, she denies any history of stroke, TIAs, expressive or receptive aphasia, or amaurosis fugax. 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   Of note she does not tolerate statins. She has not been taking her aspirin.  Past Medical History:  Diagnosis Date  . ANCA-associated vasculitis ([place])   . Anxiety   . Cancer ([place])     left breast  . Diabetes mellitus    states she is no longer treated for this  . GERD (gastroesophageal reflux disease)   . Glomerulonephritis   . HTN (hypertension)   . Hypercholesterolemia   . RAS (renal artery stenosis) ([place])   . Subclavian arterial stenosis ([facility])     Family History  Problem Relation Age of Onset  . Coronary artery disease      positive for premature- on fathers side  . Anesthesia problems Neg Hx   . Hypotension Neg Hx   . Malignant hyperthermia Neg Hx   . Pseudochol deficiency Neg Hx     SOCIAL HISTORY: Social History  Substance Use Topics  . Smoking status: Former Smoker    Packs/day: 0.50    Years: 4.00    Types: Cigarettes    Quit date: 12/09/1958  . Smokeless tobacco: Never Used     Comment: non-smoker  . Alcohol use No    Allergies  Allergen Reactions  . Codeine Nausea And Vomiting    Current Outpatient Prescriptions  Medication Sig Dispense Refill  . acetaminophen (TYLENOL) 325 MG tablet Take 325 mg by mouth every 6 (six) hours as needed for mild pain.    Marland Kitchen ALPRAZolam (XANAX) 1 MG tablet Take 1 mg by mouth daily as needed for anxiety. Reported on 03/19/2016    . amLODipine (NORVASC) 5 MG tablet TAKE  1 TABLET BY MOUTH DAILY. 30 tablet 8  . B Complex Vitamins (B-COMPLEX/B-12 PO) Take 1,000 tablets by mouth daily. 2 tabs daily    . FLUoxetine (PROZAC) 10 MG capsule Take 10 mg by mouth daily. Reported on 03/19/2016    . gemfibrozil (LOPID) 600 MG tablet Take 600 mg by mouth 2 (two) times daily before a meal.    . Omega-3 Fatty Acids (FISH OIL PO) Take by mouth daily.    . ranitidine (ZANTAC) 150 MG tablet Take 150 mg by mouth daily as needed for heartburn. For heartburn     No current facility-administered medications for this visit.     REVIEW OF SYSTEMS:  [X]  denotes positive finding, [ ]  denotes negative finding Cardiac  Comments:  Chest pain or chest pressure:    Shortness of breath upon exertion:    Short of breath when lying flat:    Irregular heart rhythm:        Vascular    Pain in calf, thigh, or hip brought on by ambulation:    Pain in feet at night that wakes you up from your sleep:     Blood clot in your veins:    Leg swelling:         Pulmonary  Oxygen at home:    Productive cough:     Wheezing:         Neurologic    Sudden weakness in arms or legs:     Sudden numbness in arms or legs:     Sudden onset of difficulty speaking or slurred speech:    Temporary loss of vision in one eye:     Problems with dizziness:         Gastrointestinal    Blood in stool:     Vomited blood:         Genitourinary    Burning when urinating:     Blood in urine:        Psychiatric    Major depression:         Hematologic    Bleeding problems:    Problems with blood clotting too easily:        Skin    Rashes or ulcers:        Constitutional    Fever or chills:      PHYSICAL EXAM: Vitals:   10/15/16 0847 10/15/16 0849  BP: (!) 186/74 (!) 169/74  Pulse: 70 71  Resp: 16   Temp: 97.2 F (36.2 C)   SpO2: 99%   Weight: 137 lb (62.1 kg)   Height: 5\' 6"  (1.676 m)     GENERAL: The patient is a well-nourished female, in no acute distress. The vital signs are  documented above. CARDIAC: There is a regular rate and rhythm.  VASCULAR: She has bilateral carotid bruits. She has palpable pedal pulses. She has no significant lower extremity swelling. PULMONARY: There is good air exchange bilaterally without wheezing or rales. ABDOMEN: Soft and non-tender with normal pitched bowel sounds.  MUSCULOSKELETAL: There are no major deformities or cyanosis. NEUROLOGIC: No focal weakness or paresthesias are detected. SKIN: There are no ulcers or rashes noted. PSYCHIATRIC: The patient has a normal affect.  DATA:   CAROTID DUPLEX: I have independently interpreted her carotid duplex scan. On the left side this velocities have progressed and she has a greater than 80% left carotid stenosis. On the right side she has a 40-59% carotid stenosis. Both vertebral arteries are patent with antegrade flow.  MEDICAL ISSUES:  ASYMPTOMATIC GREATER THAN 80% LEFT CAROTID STENOSIS:  The stenosis on the left has progressed to greater than 80%. I have recommended left carotid endarterectomy in order to lower her risk of future stroke. I've explained that without surgery her risk of stroke is approximately 4% per year. We can lower that risk to 1% per year with surgery. She feels strongly about not having surgery at this point because of her upcoming competitions. I've encouraged her to consider making this a higher priority however she feels strongly about this and does not want to be sebum back until June. She understands the risk of not having surgery is an increased risk of stroke. I also encouraged her to begin taking her aspirin daily as she has not been taking this as instructed. Fortunately she is not a smoker. I'll see her back in June unless she call sooner and decides to proceed with left carotid endarterectomy.  HYPERTENSION: The patient's initial blood pressure today was elevated. We repeated this and this was still elevated. We have encouraged the patient to follow up with  their primary care physician for management of their blood pressure.  Colleen Parks [facility] [phone]

## 2016-10-16 ENCOUNTER — Encounter: Payer: Self-pay | Admitting: Cardiovascular Disease

## 2016-10-16 ENCOUNTER — Ambulatory Visit (INDEPENDENT_AMBULATORY_CARE_PROVIDER_SITE_OTHER): Payer: PPO | Admitting: Cardiovascular Disease

## 2016-10-16 VITALS — BP 130/50 | HR 73 | Ht 66.0 in | Wt 140.2 lb

## 2016-10-16 DIAGNOSIS — I6523 Occlusion and stenosis of bilateral carotid arteries: Secondary | ICD-10-CM | POA: Diagnosis not present

## 2016-10-16 NOTE — Patient Instructions (Signed)

## 2016-10-21 NOTE — Addendum Note (Signed)
Addended by: Lianne Cure A on: 10/21/2016 01:31 PM   Modules accepted: Orders

## 2016-11-04 DIAGNOSIS — N183 Chronic kidney disease, stage 3 (moderate): Secondary | ICD-10-CM | POA: Diagnosis not present

## 2016-11-04 DIAGNOSIS — I10 Essential (primary) hypertension: Secondary | ICD-10-CM | POA: Diagnosis not present

## 2016-11-04 DIAGNOSIS — I6522 Occlusion and stenosis of left carotid artery: Secondary | ICD-10-CM | POA: Diagnosis not present

## 2016-11-04 DIAGNOSIS — E1165 Type 2 diabetes mellitus with hyperglycemia: Secondary | ICD-10-CM | POA: Diagnosis not present

## 2016-11-07 ENCOUNTER — Other Ambulatory Visit: Payer: Self-pay

## 2016-11-12 ENCOUNTER — Encounter (HOSPITAL_COMMUNITY)
Admission: RE | Admit: 2016-11-12 | Discharge: 2016-11-12 | Disposition: A | Discharge status: Home / Self Care | Payer: PPO | Source: Ambulatory Visit | Attending: Vascular Surgery | Admitting: Vascular Surgery

## 2016-11-12 ENCOUNTER — Encounter (HOSPITAL_COMMUNITY): Payer: Self-pay

## 2016-11-12 ENCOUNTER — Other Ambulatory Visit (HOSPITAL_COMMUNITY): Payer: Self-pay | Admitting: *Deleted

## 2016-11-12 DIAGNOSIS — I776 Arteritis, unspecified: Secondary | ICD-10-CM | POA: Diagnosis not present

## 2016-11-12 DIAGNOSIS — E119 Type 2 diabetes mellitus without complications: Secondary | ICD-10-CM | POA: Diagnosis not present

## 2016-11-12 DIAGNOSIS — Z87891 Personal history of nicotine dependence: Secondary | ICD-10-CM | POA: Diagnosis not present

## 2016-11-12 DIAGNOSIS — E7800 Pure hypercholesterolemia, unspecified: Secondary | ICD-10-CM | POA: Diagnosis not present

## 2016-11-12 DIAGNOSIS — Z7982 Long term (current) use of aspirin: Secondary | ICD-10-CM | POA: Diagnosis not present

## 2016-11-12 DIAGNOSIS — Z8249 Family history of ischemic heart disease and other diseases of the circulatory system: Secondary | ICD-10-CM | POA: Diagnosis not present

## 2016-11-12 DIAGNOSIS — F419 Anxiety disorder, unspecified: Secondary | ICD-10-CM | POA: Diagnosis not present

## 2016-11-12 DIAGNOSIS — N059 Unspecified nephritic syndrome with unspecified morphologic changes: Secondary | ICD-10-CM | POA: Diagnosis not present

## 2016-11-12 DIAGNOSIS — I10 Essential (primary) hypertension: Secondary | ICD-10-CM | POA: Diagnosis not present

## 2016-11-12 DIAGNOSIS — I6523 Occlusion and stenosis of bilateral carotid arteries: Secondary | ICD-10-CM | POA: Diagnosis not present

## 2016-11-12 DIAGNOSIS — K219 Gastro-esophageal reflux disease without esophagitis: Secondary | ICD-10-CM | POA: Diagnosis not present

## 2016-11-12 DIAGNOSIS — Z885 Allergy status to narcotic agent status: Secondary | ICD-10-CM | POA: Diagnosis not present

## 2016-11-12 HISTORY — DX: Sleep apnea, unspecified: G47.30

## 2016-11-12 LAB — URINALYSIS, ROUTINE W REFLEX MICROSCOPIC
BACTERIA UA: NONE SEEN
BILIRUBIN URINE: NEGATIVE
Glucose, UA: NEGATIVE mg/dL
Hgb urine dipstick: NEGATIVE
KETONES UR: NEGATIVE mg/dL
Leukocytes, UA: NEGATIVE
Nitrite: NEGATIVE
PROTEIN: 100 mg/dL — AB
Specific Gravity, Urine: 1.015 (ref 1.005–1.030)
pH: 5 (ref 5.0–8.0)

## 2016-11-12 LAB — COMPREHENSIVE METABOLIC PANEL
ALT: 13 U/L — AB (ref 14–54)
AST: 20 U/L (ref 15–41)
Albumin: 4 g/dL (ref 3.5–5.0)
Alkaline Phosphatase: 83 U/L (ref 38–126)
Anion gap: 10 (ref 5–15)
BILIRUBIN TOTAL: 0.8 mg/dL (ref 0.3–1.2)
BUN: 40 mg/dL — ABNORMAL HIGH (ref 6–20)
CALCIUM: 10.2 mg/dL (ref 8.9–10.3)
CHLORIDE: 107 mmol/L (ref 101–111)
CO2: 25 mmol/L (ref 22–32)
CREATININE: 1.98 mg/dL — AB (ref 0.44–1.00)
GFR calc Af Amer: 26 mL/min — ABNORMAL LOW (ref >60)
GFR, EST NON AFRICAN AMERICAN: 23 mL/min — AB (ref >60)
Glucose, Bld: 139 mg/dL — ABNORMAL HIGH (ref 65–99)
Potassium: 4.6 mmol/L (ref 3.5–5.1)
Sodium: 142 mmol/L (ref 135–145)
TOTAL PROTEIN: 7.8 g/dL (ref 6.5–8.1)

## 2016-11-12 LAB — CBC
HCT: 35.9 % — ABNORMAL LOW (ref 36.0–46.0)
Hemoglobin: 11.5 g/dL — ABNORMAL LOW (ref 12.0–15.0)
MCH: 29.4 pg (ref 26.0–34.0)
MCHC: 32 g/dL (ref 30.0–36.0)
MCV: 91.8 fL (ref 78.0–100.0)
PLATELETS: 252 10*3/uL (ref 150–400)
RBC: 3.91 MIL/uL (ref 3.87–5.11)
RDW: 12.6 % (ref 11.5–15.5)
WBC: 8.7 10*3/uL (ref 4.0–10.5)

## 2016-11-12 LAB — APTT: aPTT: 29 seconds (ref 24–36)

## 2016-11-12 LAB — GLUCOSE, CAPILLARY: GLUCOSE-CAPILLARY: 141 mg/dL — AB (ref 65–99)

## 2016-11-12 LAB — TYPE AND SCREEN
ABO/RH(D): O POS
Antibody Screen: NEGATIVE

## 2016-11-12 LAB — ABO/RH: ABO/RH(D): O POS

## 2016-11-12 LAB — PROTIME-INR
INR: 0.94
PROTHROMBIN TIME: 12.6 s (ref 11.4–15.2)

## 2016-11-12 LAB — SURGICAL PCR SCREEN
MRSA, PCR: NEGATIVE
Staphylococcus aureus: POSITIVE — AB

## 2016-11-12 NOTE — Progress Notes (Signed)
Pt denies cardiac history, chest pain or sob. Pt does see Dr. Johnsie Cancel due to suspected family history of cardiac disease. Pt is diabetic, is not on medication. Does check her blood sugar at home occasionally. States it's usually around 140 when she does. Last A1C was 6.3 approx 3 months ago.

## 2016-11-12 NOTE — Pre-Procedure Instructions (Addendum)
CAMPBELL AGRAMONTE  11/12/2016    Your procedure is scheduled on Thursday, November 13, 2016 at 9:30 AM.   [facility] Admitting Office at 7:30 AM.   Call this number if you have problems the morning of surgery: [phone]   Remember:  Do not eat food or drink liquids after midnight tonight.  Take these medicines the morning of surgery with A SIP OF WATER: Amlodipine (Norvasc), Aspirin, Ranitidine (Zantac) - if needed   How to Manage Your Diabetes Before Surgery   Why is it important to control my blood sugar before and after surgery?   Improving blood sugar levels before and after surgery helps healing and can limit problems.  A way of improving blood sugar control is eating a healthy diet by:  - Eating less sugar and carbohydrates  - Increasing activity/exercise  - Talk with your doctor about reaching your blood sugar goals  High blood sugars (greater than 180 mg/dL) can raise your risk of infections and slow down your recovery so you will need to focus on controlling your diabetes during the weeks before surgery.  Make sure that the doctor who takes care of your diabetes knows about your planned surgery including the date and location.  How do I manage my blood sugars before surgery?   Check your blood sugar at least 4 times a day, 2 days before surgery to make sure that they are not too high or low.  Check your blood sugar the morning of your surgery when you wake up and every 2 hours until you get to the Short-Stay unit.  Treat a low blood sugar (less than 70 mg/dL) with 1/2 cup of clear juice (cranberry or apple), 4 glucose tablets, OR glucose gel.  Recheck blood sugar in 15 minutes after treatment (to make sure it is greater than 70 mg/dL).  If blood sugar is not greater than 70 mg/dL on re-check, call [phone] for further instructions.   Report your blood sugar to the Short-Stay nurse when you get to Short-Stay.  References:   [facility], 2007 "How to Manage your Diabetes Before and After Surgery".   Do not wear jewelry, make-up or nail polish.  Do not wear lotions, powders, perfumes or deodorant.  Do not shave 48 hours prior to surgery.   Do not bring valuables to the hospital.  [facility] is not responsible for any belongings or valuables.  Contacts, dentures or bridgework may not be worn into surgery.  Leave your suitcase in the car.  After surgery it may be brought to your room.  For patients admitted to the hospital, discharge time will be determined by your treatment team.  Special instructions:  [facility] - Preparing for Surgery  Before surgery, you can play an important role.  Because skin is not sterile, your skin needs to be as free of germs as possible.  You can reduce the number of germs on you skin by washing with CHG (chlorahexidine gluconate) soap before surgery.  CHG is an antiseptic cleaner which kills germs and bonds with the skin to continue killing germs even after washing.  Please DO NOT use if you have an allergy to CHG or antibacterial soaps.  If your skin becomes reddened/irritated stop using the CHG and inform your nurse when you arrive at Short Stay.  Do not shave (including legs and underarms) for at least 48 hours prior to the first CHG shower.  You may  shave your face.  Please follow these instructions carefully:   1.  Shower with CHG Soap the night before surgery and the                    morning of Surgery.  2.  If you choose to wash your hair, wash your hair first as usual with your       normal shampoo.  3.  After you shampoo, rinse your hair and body thoroughly to remove the shampoo.  4.  Use CHG as you would any other liquid soap.  You can apply chg directly       to the skin and wash gently with scrungie or a clean washcloth.  5.  Apply the CHG Soap to your body ONLY FROM THE NECK DOWN.        Do not use on open wounds or open sores.  Avoid  contact with your eyes, ears, mouth and genitals (private parts).  Wash genitals (private parts) with your normal soap.  6.  Wash thoroughly, paying special attention to the area where your surgery        will be performed.  7.  Thoroughly rinse your body with warm water from the neck down.  8.  DO NOT shower/wash with your normal soap after using and rinsing off       the CHG Soap.  9.  Pat yourself dry with a clean towel.            10.  Wear clean pajamas.            11.  Place clean sheets on your bed the night of your first shower and do not        sleep with pets.  Day of Surgery  Do not apply any lotions/deodorants the morning of surgery.  Please wear clean clothes to the hospital.   Please read over the fact sheets that you were given.

## 2016-11-12 NOTE — Progress Notes (Signed)
Anesthesia chart review: Patient is a 82 year old female scheduled for left carotid endarterectomy on 11/13/2016 by Dr. Scot Dock.  History includes former smoker (quit '60), hypercholesterolemia, renal artery stenosis, bilateral carotid artery stenosis (severe L, moderate R), subclavian artery stenosis, ANCA-associated vasculitis (s/p Cytoxan, Solumedrol 2014-2015), glomerulonephritis, CKD stage IV, HTN, left breast cancer s/p left mastectomy and chemo '95, OSA (no CPAP), DM2 (diet controlled), bilateral cataract extraction '13, brain cyst (benign appearing 08/2015 MRI). She is active in the [employer].  - PCP is Dr. Leslie Andrea. - Cardiologist is Dr. [name], last visit 10/16/16. She is followed for elevated lipids and HTN. He knew she was planning future CEA.  - Nephrologist is Dr. Izell Powell. Last visit 07/15/16 with labs then showing BUN/Cr there 40/1.79 which was at her baseline of ~ 1.7-1.9 (up to 2.10 10/19).  Meds include amlodipine, aspirin 81 mg, Prozac, Lopid, fish oil, Zantac.  BP (!) 165/62   Pulse 75   Temp 36.7 C   Resp 20   Ht 5\' 6"  (1.676 m)   Wt 140 lb 9.6 oz (63.8 kg)   SpO2 99%   BMI 22.69 kg/m   EKG 10/16/16: NSR.  According to Dr. Kyla Balzarine 10/16/16 note, patient had  A "normal myovue in 2008 with no documented CAD."  Carotid U/S 10/15/16:  1. 40-59% RICA stenosis. 2. 94-80% LICA stenosis. 3. Bilateral ECAS.  MRI brain 02/10/15: IMPRESSION: - No change since 01/10/2014. Large benign-appearing cyst emanating from the atrium of the right lateral ventricle into the substance of the right temporal lobe, size unchanged at 6.1 x 4.8 x 3.5 cm. No evidence that this represents an enlarging or neoplastic cyst. Mild mass effect which is chronic, with 5 mm of right-to-left shift. - Moderate chronic small-vessel ischemic changes throughout the pons and cerebral hemispheric white matter. 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  Preoperative labs noted. BUN 40, Cr 1.98. (Known CKD stage 4 followed by nephrology); H/H 11.5/35.9. PT.PTT WNL. Glucose 139. T&S done. UA negative for leukocytes, nitrites.   If no acute changes then I anticipate that she can proceed as planned.  [facility] Short Stay Center/Anesthesiology Phone [phone] 11/12/2016 2:59 PM

## 2016-11-12 NOTE — Progress Notes (Signed)
Mupirocin Ointment Rx called into [facility] in [place] for positive PCR of Staph. Left message on pt's voicemail informing her of results and need to pick up the Rx.

## 2016-11-13 ENCOUNTER — Encounter (HOSPITAL_COMMUNITY)
Admission: RE | Disposition: A | Discharge status: Home / Self Care | Payer: Self-pay | Source: Ambulatory Visit | Attending: Vascular Surgery

## 2016-11-13 ENCOUNTER — Inpatient Hospital Stay (HOSPITAL_COMMUNITY): Payer: PPO | Admitting: Certified Registered"

## 2016-11-13 ENCOUNTER — Encounter (HOSPITAL_COMMUNITY): Payer: Self-pay | Admitting: *Deleted

## 2016-11-13 ENCOUNTER — Inpatient Hospital Stay (HOSPITAL_COMMUNITY)
Admission: RE | Admit: 2016-11-13 | Discharge: 2016-11-14 | DRG: 039 | Disposition: A | Discharge status: Home / Self Care | Payer: PPO | Source: Ambulatory Visit | Attending: Vascular Surgery | Admitting: Vascular Surgery

## 2016-11-13 ENCOUNTER — Inpatient Hospital Stay (HOSPITAL_COMMUNITY): Payer: PPO | Admitting: Vascular Surgery

## 2016-11-13 DIAGNOSIS — Z7982 Long term (current) use of aspirin: Secondary | ICD-10-CM | POA: Diagnosis not present

## 2016-11-13 DIAGNOSIS — I10 Essential (primary) hypertension: Secondary | ICD-10-CM | POA: Diagnosis present

## 2016-11-13 DIAGNOSIS — E119 Type 2 diabetes mellitus without complications: Secondary | ICD-10-CM | POA: Diagnosis not present

## 2016-11-13 DIAGNOSIS — F419 Anxiety disorder, unspecified: Secondary | ICD-10-CM | POA: Diagnosis not present

## 2016-11-13 DIAGNOSIS — K219 Gastro-esophageal reflux disease without esophagitis: Secondary | ICD-10-CM | POA: Diagnosis present

## 2016-11-13 DIAGNOSIS — Z885 Allergy status to narcotic agent status: Secondary | ICD-10-CM | POA: Diagnosis not present

## 2016-11-13 DIAGNOSIS — I6523 Occlusion and stenosis of bilateral carotid arteries: Principal | ICD-10-CM | POA: Diagnosis present

## 2016-11-13 DIAGNOSIS — N059 Unspecified nephritic syndrome with unspecified morphologic changes: Secondary | ICD-10-CM | POA: Diagnosis not present

## 2016-11-13 DIAGNOSIS — E7800 Pure hypercholesterolemia, unspecified: Secondary | ICD-10-CM | POA: Diagnosis present

## 2016-11-13 DIAGNOSIS — I6522 Occlusion and stenosis of left carotid artery: Secondary | ICD-10-CM | POA: Diagnosis not present

## 2016-11-13 DIAGNOSIS — Z8249 Family history of ischemic heart disease and other diseases of the circulatory system: Secondary | ICD-10-CM | POA: Diagnosis not present

## 2016-11-13 DIAGNOSIS — I776 Arteritis, unspecified: Secondary | ICD-10-CM | POA: Diagnosis not present

## 2016-11-13 DIAGNOSIS — Z87891 Personal history of nicotine dependence: Secondary | ICD-10-CM | POA: Diagnosis not present

## 2016-11-13 HISTORY — PX: PATCH ANGIOPLASTY: SHX6230

## 2016-11-13 HISTORY — PX: ENDARTERECTOMY: SHX5162

## 2016-11-13 LAB — CREATININE, SERUM
Creatinine, Ser: 1.69 mg/dL — ABNORMAL HIGH (ref 0.44–1.00)
GFR, EST AFRICAN AMERICAN: 32 mL/min — AB (ref >60)
GFR, EST NON AFRICAN AMERICAN: 28 mL/min — AB (ref >60)

## 2016-11-13 LAB — CBC
HEMATOCRIT: 31.4 % — AB (ref 36.0–46.0)
Hemoglobin: 10 g/dL — ABNORMAL LOW (ref 12.0–15.0)
MCH: 29.2 pg (ref 26.0–34.0)
MCHC: 31.8 g/dL (ref 30.0–36.0)
MCV: 91.8 fL (ref 78.0–100.0)
Platelets: 201 10*3/uL (ref 150–400)
RBC: 3.42 MIL/uL — AB (ref 3.87–5.11)
RDW: 12.8 % (ref 11.5–15.5)
WBC: 10.3 10*3/uL (ref 4.0–10.5)

## 2016-11-13 LAB — HEMOGLOBIN A1C
HEMOGLOBIN A1C: 6.6 % — AB (ref 4.8–5.6)
Mean Plasma Glucose: 143 mg/dL

## 2016-11-13 SURGERY — ENDARTERECTOMY, CAROTID
Anesthesia: General | Site: Neck | Laterality: Left

## 2016-11-13 MED ORDER — LIDOCAINE-EPINEPHRINE (PF) 1 %-1:200000 IJ SOLN
INTRAMUSCULAR | Status: AC
  Filled 2016-11-13: qty 30

## 2016-11-13 MED ORDER — ONDANSETRON HCL 4 MG/2ML IJ SOLN
INTRAMUSCULAR | Status: AC
  Filled 2016-11-13: qty 2

## 2016-11-13 MED ORDER — CHLORHEXIDINE GLUCONATE CLOTH 2 % EX PADS: 6.0000 | MEDICATED_PAD | Freq: Once | CUTANEOUS | Status: DC

## 2016-11-13 MED ORDER — GUAIFENESIN-DM 100-10 MG/5ML PO SYRP: 15.0000 mL | ORAL_SOLUTION | ORAL | Status: DC | PRN

## 2016-11-13 MED ORDER — PANTOPRAZOLE SODIUM 40 MG PO TBEC
40.0000 mg | DELAYED_RELEASE_TABLET | Freq: Every day | ORAL | Status: DC
  Administered 2016-11-14: 40 mg via ORAL
  Filled 2016-11-13: qty 1

## 2016-11-13 MED ORDER — FENTANYL CITRATE (PF) 100 MCG/2ML IJ SOLN
INTRAMUSCULAR | Status: AC
  Administered 2016-11-13: 25 ug via INTRAVENOUS
  Filled 2016-11-13: qty 2

## 2016-11-13 MED ORDER — ARTIFICIAL TEARS OP OINT
TOPICAL_OINTMENT | OPHTHALMIC | Status: AC
  Filled 2016-11-13: qty 3.5

## 2016-11-13 MED ORDER — ROCURONIUM BROMIDE 50 MG/5ML IV SOSY
PREFILLED_SYRINGE | INTRAVENOUS | Status: AC
  Filled 2016-11-13: qty 5

## 2016-11-13 MED ORDER — VITAMIN B-12 1000 MCG PO TABS
1000.0000 ug | ORAL_TABLET | Freq: Every day | ORAL | Status: DC
  Administered 2016-11-14: 1000 ug via ORAL
  Filled 2016-11-13: qty 1

## 2016-11-13 MED ORDER — ONDANSETRON HCL 4 MG/2ML IJ SOLN
INTRAMUSCULAR | Status: DC | PRN
  Administered 2016-11-13: 4 mg via INTRAVENOUS

## 2016-11-13 MED ORDER — PROTAMINE SULFATE 10 MG/ML IV SOLN
INTRAVENOUS | Status: AC
  Filled 2016-11-13: qty 5

## 2016-11-13 MED ORDER — METOPROLOL TARTRATE 5 MG/5ML IV SOLN: 2.0000 mg | INTRAVENOUS | Status: DC | PRN

## 2016-11-13 MED ORDER — SODIUM CHLORIDE 0.9 % IV SOLN: INTRAVENOUS | Status: DC

## 2016-11-13 MED ORDER — DEXTROSE 5 % IV SOLN
1.5000 g | INTRAVENOUS | Status: AC
  Administered 2016-11-13: 1.5 g via INTRAVENOUS

## 2016-11-13 MED ORDER — SODIUM CHLORIDE 0.9 % IV SOLN: 500.0000 mL | Freq: Once | INTRAVENOUS | Status: DC | PRN

## 2016-11-13 MED ORDER — DEXTRAN 40 IN SALINE 10-0.9 % IV SOLN
INTRAVENOUS | Status: DC | PRN
  Administered 2016-11-13: 500 mL

## 2016-11-13 MED ORDER — LIDOCAINE HCL (PF) 1 % IJ SOLN
INTRAMUSCULAR | Status: AC
  Filled 2016-11-13: qty 30

## 2016-11-13 MED ORDER — PHENYLEPHRINE HCL 10 MG/ML IJ SOLN
INTRAVENOUS | Status: DC | PRN
  Administered 2016-11-13: 50 ug/min via INTRAVENOUS

## 2016-11-13 MED ORDER — POTASSIUM CHLORIDE CRYS ER 20 MEQ PO TBCR: 20.0000 meq | EXTENDED_RELEASE_TABLET | Freq: Every day | ORAL | Status: DC | PRN

## 2016-11-13 MED ORDER — ASPIRIN EC 81 MG PO TBEC
81.0000 mg | DELAYED_RELEASE_TABLET | Freq: Every day | ORAL | Status: DC
  Administered 2016-11-14: 81 mg via ORAL
  Filled 2016-11-13: qty 1

## 2016-11-13 MED ORDER — LIDOCAINE HCL (CARDIAC) 20 MG/ML IV SOLN
INTRAVENOUS | Status: DC | PRN
  Administered 2016-11-13: 60 mg via INTRAVENOUS

## 2016-11-13 MED ORDER — OXYCODONE-ACETAMINOPHEN 5-325 MG PO TABS
1.0000 | ORAL_TABLET | ORAL | Status: DC | PRN
  Administered 2016-11-14: 2 via ORAL
  Filled 2016-11-13: qty 2

## 2016-11-13 MED ORDER — FENTANYL CITRATE (PF) 100 MCG/2ML IJ SOLN
25.0000 ug | INTRAMUSCULAR | Status: DC | PRN
  Administered 2016-11-13 (×3): 25 ug via INTRAVENOUS
  Administered 2016-11-13: 50 ug via INTRAVENOUS
  Administered 2016-11-13: 25 ug via INTRAVENOUS

## 2016-11-13 MED ORDER — ENOXAPARIN SODIUM 30 MG/0.3ML ~~LOC~~ SOLN
30.0000 mg | SUBCUTANEOUS | Status: DC
  Administered 2016-11-14: 30 mg via SUBCUTANEOUS
  Filled 2016-11-13: qty 0.3

## 2016-11-13 MED ORDER — ROCURONIUM BROMIDE 100 MG/10ML IV SOLN
INTRAVENOUS | Status: DC | PRN
  Administered 2016-11-13: 30 mg via INTRAVENOUS

## 2016-11-13 MED ORDER — LACTATED RINGERS IV SOLN
INTRAVENOUS | Status: DC | PRN
  Administered 2016-11-13: 09:00:00 via INTRAVENOUS

## 2016-11-13 MED ORDER — HEPARIN SODIUM (PORCINE) 1000 UNIT/ML IJ SOLN
INTRAMUSCULAR | Status: AC
  Filled 2016-11-13: qty 1

## 2016-11-13 MED ORDER — HEPARIN SODIUM (PORCINE) 1000 UNIT/ML IJ SOLN
INTRAMUSCULAR | Status: DC | PRN
  Administered 2016-11-13: 2000 [IU] via INTRAVENOUS
  Administered 2016-11-13: 6000 [IU] via INTRAVENOUS

## 2016-11-13 MED ORDER — SODIUM CHLORIDE 0.9 % IV SOLN
INTRAVENOUS | Status: DC | PRN
  Administered 2016-11-13: 500 mL

## 2016-11-13 MED ORDER — ADULT MULTIVITAMIN W/MINERALS CH
1.0000 | ORAL_TABLET | Freq: Every day | ORAL | Status: DC
  Administered 2016-11-14: 1 via ORAL
  Filled 2016-11-13: qty 1

## 2016-11-13 MED ORDER — SODIUM CHLORIDE 0.9 % IV SOLN
INTRAVENOUS | Status: DC
  Administered 2016-11-13: 08:00:00 via INTRAVENOUS

## 2016-11-13 MED ORDER — PROTAMINE SULFATE 10 MG/ML IV SOLN
INTRAVENOUS | Status: DC | PRN
  Administered 2016-11-13 (×4): 10 mg via INTRAVENOUS

## 2016-11-13 MED ORDER — AMLODIPINE BESYLATE 5 MG PO TABS
5.0000 mg | ORAL_TABLET | Freq: Every day | ORAL | Status: DC
  Administered 2016-11-14: 5 mg via ORAL
  Filled 2016-11-13: qty 1

## 2016-11-13 MED ORDER — ONDANSETRON HCL 4 MG/2ML IJ SOLN: 4.0000 mg | Freq: Four times a day (QID) | INTRAMUSCULAR | Status: DC | PRN

## 2016-11-13 MED ORDER — FAMOTIDINE 20 MG PO TABS
20.0000 mg | ORAL_TABLET | Freq: Every day | ORAL | Status: DC
  Administered 2016-11-14: 20 mg via ORAL
  Filled 2016-11-13: qty 1

## 2016-11-13 MED ORDER — DOCUSATE SODIUM 100 MG PO CAPS
100.0000 mg | ORAL_CAPSULE | Freq: Every day | ORAL | Status: DC
  Administered 2016-11-14: 100 mg via ORAL
  Filled 2016-11-13: qty 1

## 2016-11-13 MED ORDER — FENTANYL CITRATE (PF) 250 MCG/5ML IJ SOLN
INTRAMUSCULAR | Status: AC
  Filled 2016-11-13: qty 5

## 2016-11-13 MED ORDER — GLYCOPYRROLATE 0.2 MG/ML IJ SOLN
INTRAMUSCULAR | Status: DC | PRN
  Administered 2016-11-13: .4 mg via INTRAVENOUS

## 2016-11-13 MED ORDER — FENTANYL CITRATE (PF) 100 MCG/2ML IJ SOLN
INTRAMUSCULAR | Status: DC | PRN
  Administered 2016-11-13: 50 ug via INTRAVENOUS

## 2016-11-13 MED ORDER — ALUM & MAG HYDROXIDE-SIMETH 200-200-20 MG/5ML PO SUSP: 15.0000 mL | ORAL | Status: DC | PRN

## 2016-11-13 MED ORDER — FENTANYL CITRATE (PF) 100 MCG/2ML IJ SOLN
INTRAMUSCULAR | Status: AC
  Administered 2016-11-13: 50 ug via INTRAVENOUS
  Filled 2016-11-13: qty 2

## 2016-11-13 MED ORDER — PROPOFOL 10 MG/ML IV BOLUS
INTRAVENOUS | Status: AC
  Filled 2016-11-13: qty 20

## 2016-11-13 MED ORDER — PHENOL 1.4 % MT LIQD: 1.0000 | OROMUCOSAL | Status: DC | PRN

## 2016-11-13 MED ORDER — MORPHINE SULFATE (PF) 2 MG/ML IV SOLN
1.0000 mg | INTRAVENOUS | Status: DC | PRN
  Administered 2016-11-13: 2 mg via INTRAVENOUS
  Filled 2016-11-13: qty 1

## 2016-11-13 MED ORDER — NEOSTIGMINE METHYLSULFATE 10 MG/10ML IV SOLN
INTRAVENOUS | Status: DC | PRN
  Administered 2016-11-13: 3 mg via INTRAVENOUS

## 2016-11-13 MED ORDER — PROPOFOL 10 MG/ML IV BOLUS
INTRAVENOUS | Status: DC | PRN
  Administered 2016-11-13 (×2): 20 mg via INTRAVENOUS
  Administered 2016-11-13: 80 mg via INTRAVENOUS
  Administered 2016-11-13: 20 mg via INTRAVENOUS

## 2016-11-13 MED ORDER — THROMBIN 20000 UNITS EX SOLR
CUTANEOUS | Status: AC
  Filled 2016-11-13: qty 20000

## 2016-11-13 MED ORDER — LIDOCAINE 2% (20 MG/ML) 5 ML SYRINGE
INTRAMUSCULAR | Status: AC
  Filled 2016-11-13: qty 5

## 2016-11-13 MED ORDER — SODIUM CHLORIDE 0.9 % IV SOLN
INTRAVENOUS | Status: DC
  Administered 2016-11-13: 20:00:00 via INTRAVENOUS

## 2016-11-13 MED ORDER — ACETAMINOPHEN 325 MG PO TABS: 325.0000 mg | ORAL_TABLET | Freq: Four times a day (QID) | ORAL | Status: DC | PRN

## 2016-11-13 MED ORDER — SODIUM CHLORIDE 0.9 % IV SOLN
0.0125 ug/kg/min | INTRAVENOUS | Status: AC
  Administered 2016-11-13: .05 ug/kg/min via INTRAVENOUS
  Filled 2016-11-13: qty 2000

## 2016-11-13 MED ORDER — LABETALOL HCL 5 MG/ML IV SOLN
10.0000 mg | INTRAVENOUS | Status: DC | PRN
  Administered 2016-11-13 (×2): 10 mg via INTRAVENOUS
  Filled 2016-11-13 (×2): qty 4

## 2016-11-13 MED ORDER — MAGNESIUM SULFATE 2 GM/50ML IV SOLN: 2.0000 g | Freq: Every day | INTRAVENOUS | Status: DC | PRN

## 2016-11-13 MED ORDER — GEMFIBROZIL 600 MG PO TABS
600.0000 mg | ORAL_TABLET | Freq: Two times a day (BID) | ORAL | Status: DC
  Administered 2016-11-14: 600 mg via ORAL
  Filled 2016-11-13 (×2): qty 1

## 2016-11-13 MED ORDER — NEOSTIGMINE METHYLSULFATE 5 MG/5ML IV SOSY
PREFILLED_SYRINGE | INTRAVENOUS | Status: AC
  Filled 2016-11-13: qty 5

## 2016-11-13 MED ORDER — EPHEDRINE SULFATE 50 MG/ML IJ SOLN
INTRAMUSCULAR | Status: DC | PRN
  Administered 2016-11-13 (×3): 5 mg via INTRAVENOUS

## 2016-11-13 MED ORDER — LACTATED RINGERS IV SOLN: INTRAVENOUS | Status: DC

## 2016-11-13 MED ORDER — 0.9 % SODIUM CHLORIDE (POUR BTL) OPTIME
TOPICAL | Status: DC | PRN
  Administered 2016-11-13: 2000 mL

## 2016-11-13 MED ORDER — DEXTRAN 40 IN SALINE 10-0.9 % IV SOLN
INTRAVENOUS | Status: AC
  Filled 2016-11-13: qty 500

## 2016-11-13 MED ORDER — HYDRALAZINE HCL 20 MG/ML IJ SOLN
5.0000 mg | INTRAMUSCULAR | Status: DC | PRN
  Administered 2016-11-13: 5 mg via INTRAVENOUS
  Filled 2016-11-13: qty 1

## 2016-11-13 MED ORDER — DEXTROSE 5 % IV SOLN
INTRAVENOUS | Status: AC
  Filled 2016-11-13: qty 1.5

## 2016-11-13 MED ORDER — FLUOXETINE HCL 10 MG PO CAPS
10.0000 mg | ORAL_CAPSULE | ORAL | Status: DC
  Administered 2016-11-14: 10 mg via ORAL
  Filled 2016-11-13: qty 1

## 2016-11-13 MED ORDER — LIDOCAINE-EPINEPHRINE (PF) 1 %-1:200000 IJ SOLN
INTRAMUSCULAR | Status: DC | PRN
  Administered 2016-11-13: 10 mL via INTRADERMAL

## 2016-11-13 MED ORDER — DEXTROSE 5 % IV SOLN
1.5000 g | Freq: Two times a day (BID) | INTRAVENOUS | Status: AC
  Administered 2016-11-13 – 2016-11-14 (×2): 1.5 g via INTRAVENOUS
  Filled 2016-11-13 (×2): qty 1.5

## 2016-11-13 SURGICAL SUPPLY — 46 items
ADH SKN CLS APL DERMABOND .7 (GAUZE/BANDAGES/DRESSINGS) ×1
ADH SKN CLS LQ APL DERMABOND (GAUZE/BANDAGES/DRESSINGS) ×1
BAG DECANTER FOR FLEXI CONT (MISCELLANEOUS) ×3 IMPLANT
CANISTER SUCT 3000ML PPV (MISCELLANEOUS) ×3 IMPLANT
CANNULA VESSEL 3MM 2 BLNT TIP (CANNULA) ×5 IMPLANT
CATH ROBINSON RED A/P 18FR (CATHETERS) ×3 IMPLANT
CATH SUCT 10FR WHISTLE TIP (CATHETERS) ×2 IMPLANT
CLIP TI MEDIUM 24 (CLIP) ×3 IMPLANT
CLIP TI WIDE RED SMALL 24 (CLIP) ×3 IMPLANT
CRADLE DONUT ADULT HEAD (MISCELLANEOUS) ×3 IMPLANT
DERMABOND ADHESIVE PROPEN (GAUZE/BANDAGES/DRESSINGS) ×2
DERMABOND ADVANCED (GAUZE/BANDAGES/DRESSINGS) ×2
DERMABOND ADVANCED .7 DNX12 (GAUZE/BANDAGES/DRESSINGS) ×1 IMPLANT
DERMABOND ADVANCED .7 DNX6 (GAUZE/BANDAGES/DRESSINGS) IMPLANT
DRAIN CHANNEL 15F RND FF W/TCR (WOUND CARE) IMPLANT
ELECT REM PT RETURN 9FT ADLT (ELECTROSURGICAL) ×3
ELECTRODE REM PT RTRN 9FT ADLT (ELECTROSURGICAL) ×1 IMPLANT
EVACUATOR SILICONE 100CC (DRAIN) IMPLANT
GLOVE BIO SURGEON STRL SZ7.5 (GLOVE) ×3 IMPLANT
GLOVE BIOGEL PI IND STRL 8 (GLOVE) ×1 IMPLANT
GLOVE BIOGEL PI INDICATOR 8 (GLOVE) ×2
GOWN STRL REUS W/ TWL LRG LVL3 (GOWN DISPOSABLE) ×3 IMPLANT
GOWN STRL REUS W/TWL LRG LVL3 (GOWN DISPOSABLE) ×9
KIT BASIN OR (CUSTOM PROCEDURE TRAY) ×3 IMPLANT
KIT ROOM TURNOVER OR (KITS) ×3 IMPLANT
NDL 18GX1X1/2 (RX/OR ONLY) (NEEDLE) IMPLANT
NDL HYPO 25X1 1.5 SAFETY (NEEDLE) ×1 IMPLANT
NEEDLE 18GX1X1/2 (RX/OR ONLY) (NEEDLE) ×3 IMPLANT
NEEDLE HYPO 25X1 1.5 SAFETY (NEEDLE) ×3 IMPLANT
NS IRRIG 1000ML POUR BTL (IV SOLUTION) ×6 IMPLANT
PACK CAROTID (CUSTOM PROCEDURE TRAY) ×3 IMPLANT
PAD ARMBOARD 7.5X6 YLW CONV (MISCELLANEOUS) ×6 IMPLANT
PATCH HEMASHIELD 8X150 (Vascular Products) ×2 IMPLANT
SHUNT CAROTID BYPASS 10 (VASCULAR PRODUCTS) IMPLANT
SHUNT CAROTID BYPASS 12 (VASCULAR PRODUCTS) IMPLANT
SHUNT CAROTID BYPASS 12FRX15.5 (VASCULAR PRODUCTS) ×2 IMPLANT
SPONGE INTESTINAL PEANUT (DISPOSABLE) ×3 IMPLANT
SPONGE SURGIFOAM ABS GEL 100 (HEMOSTASIS) IMPLANT
SUT PROLENE 6 0 BV (SUTURE) ×9 IMPLANT
SUT PROLENE 7 0 BV 1 (SUTURE) IMPLANT
SUT SILK 2 0 FS (SUTURE) IMPLANT
SUT VIC AB 3-0 SH 27 (SUTURE) ×3
SUT VIC AB 3-0 SH 27X BRD (SUTURE) ×1 IMPLANT
SUT VICRYL 4-0 PS2 18IN ABS (SUTURE) ×3 IMPLANT
SYR CONTROL 10ML LL (SYRINGE) ×3 IMPLANT
WATER STERILE IRR 1000ML POUR (IV SOLUTION) ×3 IMPLANT

## 2016-11-13 NOTE — Interval H&P Note (Signed)
History and Physical Interval Note:  11/13/2016 9:16 AM  Colleen Parks  has presented today for surgery, with the diagnosis of Left carotid artery stenosis I65.22  The various methods of treatment have been discussed with the patient and family. After consideration of risks, benefits and other options for treatment, the patient has consented to  Procedure(s): ENDARTERECTOMY CAROTID (Left) as a surgical intervention .  The patient's history has been reviewed, patient examined, no change in status, stable for surgery.  I have reviewed the patient's chart and labs.  Questions were answered to the patient's satisfaction.     [name]

## 2016-11-13 NOTE — Op Note (Signed)
    NAME: Colleen Parks   MRN: [id number] DOB: Apr 20, 1935    DATE OF OPERATION: 11/13/2016  PREOP DIAGNOSIS: Asymptomatic greater than 80% left carotid stenosis  POSTOP DIAGNOSIS: Same  PROCEDURE: Left carotid endarterectomy with Dacron patch angioplasty  SURGEON: Judeth Cornfield. Scot Dock, MD, FACS  ASSIST: Silva Bandy, [facility]  ANESTHESIA: Gen.   EBL: Minimal  INDICATIONS: Colleen Parks is a 82 y.o. female with a greater than 80% left carotid stenosis and a 40-59% right carotid stenosis. Left carotid endarterectomy was recommended in order to lower her risk of future stroke.  FINDINGS: The plaque was extremely long extending both low down into the common carotid artery and also high up into the internal carotid artery.  TECHNIQUE: The patient was taken to the operating room and received a general anesthetic. The left neck was prepped and draped in usual sterile fashion. An incision was made along the anterior border of the sternocleidomastoid and dissection carried down to the common carotid artery which was dissected free and controlled with Rummel tourniquet. The facial vein was divided between 2-0 silk ties. The patient was heparinized. The plaque extended very low into the common carotid artery and I had to extend the incision and dissected common carotid artery quite low. In addition, the plaque extended high up the internal carotid artery and had to fully mobilize the internal carotid artery distally. I was eventually able to get above and below the plaque. Clamps were then placed on the internal and the external then the common carotid artery. A longitudinal arteriotomy was made in the common carotid artery and this was extended proximally and distally. A 10 shunt was placed into the internal carotid artery back bled and then placed into the common carotid artery and secured with Rummel tourniquet. Flow was reestablished to the shunt. Of note there was excellent backbleeding. An  endarterectomy plane was established proximally and the plaque was sharply divided. Eversion endarterectomy was performed of the external carotid artery. Distally there was a nice taper in the plaque and no tacking sutures were required. The artery was irrigated with cups amounts of heparin and dextran almost debris removed. A long Dacron patch was then sewn using continuous 6-0 Prolene suture. Prior to completing the patch closure, the shunt was removed and the arteries were backbled and flushed appropriately. The anastomosis was then completed. Flow was reestablished first to the external carotid artery and into the internal carotid artery. The patient was excellent Doppler signal with good diastolic flow distal to the patch. The heparin was partially reversed with protamine. The wound was irrigated and hemostasis obtained. The wound was closed with a deep layer of 3-0 Vicryl. The platysma was closed with running 3-0 Vicryl. The skin was closed with a 4-0 stitch. Dermabond was applied. The patient tolerated the procedure well and was transferred to the recovery room in stable condition. All needle and sponge counts were correct. The patient awoke neurologically intact.  [name], MD, FACS [facility]  DATE OF DICTATION:   11/13/2016

## 2016-11-13 NOTE — Anesthesia Postprocedure Evaluation (Addendum)
Anesthesia Post Note  Patient: Colleen Parks  Procedure(s) Performed: Procedure(s) (LRB): LEFT CAROTID ARTERY ENDARTERECTOMY (Left) PATCH ANGIOPLASTY OF THE LEFT CAROTID ARTERY USING HEMASHEILD PACTH (Left)  Patient location during evaluation: PACU Anesthesia Type: General Level of consciousness: awake and alert Pain management: pain level controlled Vital Signs Assessment: post-procedure vital signs reviewed and stable Respiratory status: spontaneous breathing, nonlabored ventilation, respiratory function stable and patient connected to nasal cannula oxygen Cardiovascular status: blood pressure returned to baseline and stable Postop Assessment: no signs of nausea or vomiting Anesthetic complications: no       Last Vitals:  Vitals:   11/13/16 1313 11/13/16 1328  BP: (!) 153/47 (!) 152/50  Pulse: 68 67  Resp: 10 19  Temp:      Last Pain:  Vitals:   11/13/16 1330  TempSrc:   PainSc: 7                  Kalianne Fetting,JAMES TERRILL

## 2016-11-13 NOTE — Interval H&P Note (Signed)
History and Physical Interval Note:  11/13/2016 7:28 AM  Colleen Parks  has presented today for surgery, with the diagnosis of Left carotid artery stenosis I65.22  The various methods of treatment have been discussed with the patient and family. After consideration of risks, benefits and other options for treatment, the patient has consented to  Procedure(s): ENDARTERECTOMY CAROTID (Left) as a surgical intervention .  The patient's history has been reviewed, patient examined, no change in status, stable for surgery.  I have reviewed the patient's chart and labs.  Questions were answered to the patient's satisfaction.     [name]

## 2016-11-13 NOTE — H&P (View-Only) (Signed)
Patient name: Colleen Parks MRN: [id number] DOB: [birth year] Sex: female  REASON FOR VISIT: Follow up of bilateral carotid disease.  HPI: Colleen Parks is a 82 y.o. female who I saw in consultation on 03/19/2016 with a left carotid stenosis. At that time she had an asymptomatic 60-79% left carotid stenosis. I recommended a follow up duplex scan in 6 months.   Since I saw her last, she denies any history of stroke, TIAs, expressive or receptive aphasia, or amaurosis fugax. 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   Of note she does not tolerate statins. She has not been taking her aspirin.  Past Medical History:  Diagnosis Date  . 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 Anxiety   . Cancer ([place])     left breast  . Diabetes mellitus    states she is no longer treated for this  . GERD (gastroesophageal reflux disease)   . Glomerulonephritis   . HTN (hypertension)   . Hypercholesterolemia   . RAS (renal artery stenosis) ([place])   . Subclavian arterial stenosis ([facility])     Family History  Problem Relation Age of Onset  . Coronary artery disease      positive for premature- on fathers side  . Anesthesia problems Neg Hx   . Hypotension Neg Hx   . Malignant hyperthermia Neg Hx   . Pseudochol deficiency Neg Hx     SOCIAL HISTORY: Social History  Substance Use Topics  . Smoking status: Former Smoker    Packs/day: 0.50    Years: 4.00    Types: Cigarettes    Quit date: 12/09/1958  . Smokeless tobacco: Never Used     Comment: non-smoker  . Alcohol use No    Allergies  Allergen Reactions  . Codeine Nausea And Vomiting    Current Outpatient Prescriptions  Medication Sig Dispense Refill  . acetaminophen (TYLENOL) 325 MG tablet Take 325 mg by mouth every 6 (six) hours as needed for mild pain.    Marland Kitchen ALPRAZolam (XANAX) 1 MG tablet Take 1 mg by mouth daily as needed for anxiety. Reported on 03/19/2016    . amLODipine (NORVASC) 5 MG tablet TAKE  1 TABLET BY MOUTH DAILY. 30 tablet 8  . B Complex Vitamins (B-COMPLEX/B-12 PO) Take 1,000 tablets by mouth daily. 2 tabs daily    . FLUoxetine (PROZAC) 10 MG capsule Take 10 mg by mouth daily. Reported on 03/19/2016    . gemfibrozil (LOPID) 600 MG tablet Take 600 mg by mouth 2 (two) times daily before a meal.    . Omega-3 Fatty Acids (FISH OIL PO) Take by mouth daily.    . ranitidine (ZANTAC) 150 MG tablet Take 150 mg by mouth daily as needed for heartburn. For heartburn     No current facility-administered medications for this visit.     REVIEW OF SYSTEMS:  [X]  denotes positive finding, [ ]  denotes negative finding Cardiac  Comments:  Chest pain or chest pressure:    Shortness of breath upon exertion:    Short of breath when lying flat:    Irregular heart rhythm:        Vascular    Pain in calf, thigh, or hip brought on by ambulation:    Pain in feet at night that wakes you up from your sleep:     Blood clot in your veins:    Leg swelling:         Pulmonary  Oxygen at home:    Productive cough:     Wheezing:         Neurologic    Sudden weakness in arms or legs:     Sudden numbness in arms or legs:     Sudden onset of difficulty speaking or slurred speech:    Temporary loss of vision in one eye:     Problems with dizziness:         Gastrointestinal    Blood in stool:     Vomited blood:         Genitourinary    Burning when urinating:     Blood in urine:        Psychiatric    Major depression:         Hematologic    Bleeding problems:    Problems with blood clotting too easily:        Skin    Rashes or ulcers:        Constitutional    Fever or chills:      PHYSICAL EXAM: Vitals:   10/15/16 0847 10/15/16 0849  BP: (!) 186/74 (!) 169/74  Pulse: 70 71  Resp: 16   Temp: 97.2 F (36.2 C)   SpO2: 99%   Weight: 137 lb (62.1 kg)   Height: 5\' 6"  (1.676 m)     GENERAL: The patient is a well-nourished female, in no acute distress. The vital signs are  documented above. CARDIAC: There is a regular rate and rhythm.  VASCULAR: She has bilateral carotid bruits. She has palpable pedal pulses. She has no significant lower extremity swelling. PULMONARY: There is good air exchange bilaterally without wheezing or rales. ABDOMEN: Soft and non-tender with normal pitched bowel sounds.  MUSCULOSKELETAL: There are no major deformities or cyanosis. NEUROLOGIC: No focal weakness or paresthesias are detected. SKIN: There are no ulcers or rashes noted. PSYCHIATRIC: The patient has a normal affect.  DATA:   CAROTID DUPLEX: I have independently interpreted her carotid duplex scan. On the left side this velocities have progressed and she has a greater than 80% left carotid stenosis. On the right side she has a 40-59% carotid stenosis. Both vertebral arteries are patent with antegrade flow.  MEDICAL ISSUES:  ASYMPTOMATIC GREATER THAN 80% LEFT CAROTID STENOSIS:  The stenosis on the left has progressed to greater than 80%. I have recommended left carotid endarterectomy in order to lower her risk of future stroke. I've explained that without surgery her risk of stroke is approximately 4% per year. We can lower that risk to 1% per year with surgery. She feels strongly about not having surgery at this point because of her upcoming competitions. I've encouraged her to consider making this a higher priority however she feels strongly about this and does not want to be sebum back until June. She understands the risk of not having surgery is an increased risk of stroke. I also encouraged her to begin taking her aspirin daily as she has not been taking this as instructed. Fortunately she is not a smoker. I'll see her back in June unless she call sooner and decides to proceed with left carotid endarterectomy.  HYPERTENSION: The patient's initial blood pressure today was elevated. We repeated this and this was still elevated. We have encouraged the patient to follow up with  their primary care physician for management of their blood pressure.  [name] [facility] [place] [phone]

## 2016-11-13 NOTE — Care Management Note (Signed)
Case Management Note  Patient Details  Name: Colleen Parks MRN: [id number] Date of Birth: [birth year]  Subjective/Objective:    S/p L CEA, she lives with spouse, pta very indep and very active.  She has a PCP, Dr. Karie Kirks, she has medication coverage and she will have transportation at dc.  NCM will cont to follow for dc needs.                Action/Plan:   Expected Discharge Date:                  Expected Discharge Plan:  Home/Self Care  In-House Referral:     Discharge planning Services  CM Consult  Post Acute Care Choice:    Choice offered to:     DME Arranged:    DME Agency:     HH Arranged:    HH Agency:     Status of Service:  Completed, signed off  If discussed at [employer] of Stay Meetings, dates discussed:    Additional Comments:  Zenon Mayo, RN 11/13/2016, 3:37 PM

## 2016-11-13 NOTE — Transfer of Care (Signed)
Immediate Anesthesia Transfer of Care Note  Patient: Colleen Parks  Procedure(s) Performed: Procedure(s): LEFT CAROTID ARTERY ENDARTERECTOMY (Left) PATCH ANGIOPLASTY OF THE LEFT CAROTID ARTERY USING HEMASHEILD PACTH (Left)  Patient Location: PACU  Anesthesia Type:General  Level of Consciousness: awake, alert  and patient cooperative  Airway & Oxygen Therapy: Patient Spontanous Breathing and Patient connected to nasal cannula oxygen  Post-op Assessment: Report given to RN, Post -op Vital signs reviewed and stable, Patient moving all extremities X 4 and Patient able to stick tongue midline  Post vital signs: Reviewed and stable  Last Vitals:  Vitals:   11/13/16 0812  BP: (!) 139/59  Pulse: 67  Resp: 20  Temp: 36.5 C    Last Pain:  Vitals:   11/13/16 0812  TempSrc: Oral      Patients Stated Pain Goal: 3 (20/10/07 1219)  Complications: No apparent anesthesia complications

## 2016-11-13 NOTE — Progress Notes (Addendum)
[facility] Doing well postop. No focal weakness or paresthesias. Awaiting stepdown bed.  [name], MD, FACS [facility] [phone] Office: [phone]   Subjective  - Comfortable over all   Objective (!) 171/58 74 97.8 F (36.6 C) (!) 9 99%  Intake/Output Summary (Last 24 hours) at 11/13/16 1329 Last data filed at 11/13/16 1136  Gross per 24 hour  Intake             1300 ml  Output              100 ml  Net             1200 ml    Grip 5/5 Palpable radial pulse Incision clean an dry without hematoma No tongue deviation and smile is symmetric  Assessment/Planning: POD left CEA  Stable disposition  Theda Sers, EMMA MAUREEN 11/13/2016 1:29 PM --  Laboratory Lab Results:  Recent Labs  11/12/16 0852  WBC 8.7  HGB 11.5*  HCT 35.9*  PLT 252   BMET  Recent Labs  11/12/16 0852  NA 142  K 4.6  CL 107  CO2 25  GLUCOSE 139*  BUN 40*  CREATININE 1.98*  CALCIUM 10.2    COAG Lab Results  Component Value Date   INR 0.94 11/12/2016   INR 0.90 11/15/2013   INR 0.97 06/30/2013   No results found for: PTT

## 2016-11-13 NOTE — Anesthesia Preprocedure Evaluation (Addendum)
Anesthesia Evaluation  Patient identified by MRN, date of birth, ID band Patient awake    Reviewed: Allergy & Precautions, NPO status , Patient's Chart, lab work & pertinent test results  Airway Mallampati: I  TM Distance: >3 FB Neck ROM: Full    Dental  (+) Partial Lower, Teeth Intact, Dental Advisory Given   Pulmonary sleep apnea , former smoker,    breath sounds clear to auscultation       Cardiovascular hypertension, Pt. on medications + Peripheral Vascular Disease   Rhythm:Regular Rate:Normal     Neuro/Psych    GI/Hepatic GERD  Medicated and Controlled,  Endo/Other  diabetes  Renal/GU Renal disease     Musculoskeletal   Abdominal   Peds  Hematology   Anesthesia Other Findings   Reproductive/Obstetrics                           Anesthesia Physical Anesthesia Plan  ASA: III  Anesthesia Plan: General   Post-op Pain Management:    Induction: Intravenous  Airway Management Planned: Oral ETT  Additional Equipment: Arterial line  Intra-op Plan:   Post-operative Plan: Extubation in OR  Informed Consent: I have reviewed the patients History and Physical, chart, labs and discussed the procedure including the risks, benefits and alternatives for the proposed anesthesia with the patient or authorized representative who has indicated his/her understanding and acceptance.   Dental advisory given  Plan Discussed with:   Anesthesia Plan Comments:         Anesthesia Quick Evaluation

## 2016-11-13 NOTE — Anesthesia Procedure Notes (Signed)
Procedure Name: Intubation Date/Time: 11/13/2016 9:42 AM Performed by: Suzy Bouchard Pre-anesthesia Checklist: Patient identified, Emergency Drugs available, Suction available, Patient being monitored and Timeout performed Patient Re-evaluated:Patient Re-evaluated prior to inductionOxygen Delivery Method: Circle system utilized Preoxygenation: Pre-oxygenation with 100% oxygen Intubation Type: IV induction Ventilation: Mask ventilation without difficulty Laryngoscope Size: Miller and 2 Grade View: Grade I Tube type: Oral Tube size: 7.0 mm Number of attempts: 1 Airway Equipment and Method: Stylet Placement Confirmation: ETT inserted through vocal cords under direct vision,  CO2 detector and breath sounds checked- equal and bilateral Secured at: 22 cm Tube secured with: Tape Dental Injury: Teeth and Oropharynx as per pre-operative assessment

## 2016-11-14 ENCOUNTER — Encounter (HOSPITAL_COMMUNITY): Payer: Self-pay | Admitting: Vascular Surgery

## 2016-11-14 ENCOUNTER — Telehealth: Payer: Self-pay | Admitting: Vascular Surgery

## 2016-11-14 ENCOUNTER — Encounter: Payer: Self-pay | Admitting: Vascular Surgery

## 2016-11-14 LAB — CBC
HCT: 31.8 % — ABNORMAL LOW (ref 36.0–46.0)
Hemoglobin: 10 g/dL — ABNORMAL LOW (ref 12.0–15.0)
MCH: 28.9 pg (ref 26.0–34.0)
MCHC: 31.4 g/dL (ref 30.0–36.0)
MCV: 91.9 fL (ref 78.0–100.0)
PLATELETS: 212 10*3/uL (ref 150–400)
RBC: 3.46 MIL/uL — AB (ref 3.87–5.11)
RDW: 12.9 % (ref 11.5–15.5)
WBC: 10.6 10*3/uL — AB (ref 4.0–10.5)

## 2016-11-14 LAB — BASIC METABOLIC PANEL
ANION GAP: 10 (ref 5–15)
BUN: 29 mg/dL — AB (ref 6–20)
CO2: 24 mmol/L (ref 22–32)
Calcium: 9.5 mg/dL (ref 8.9–10.3)
Chloride: 106 mmol/L (ref 101–111)
Creatinine, Ser: 1.75 mg/dL — ABNORMAL HIGH (ref 0.44–1.00)
GFR calc Af Amer: 31 mL/min — ABNORMAL LOW (ref >60)
GFR, EST NON AFRICAN AMERICAN: 27 mL/min — AB (ref >60)
Glucose, Bld: 171 mg/dL — ABNORMAL HIGH (ref 65–99)
POTASSIUM: 4.5 mmol/L (ref 3.5–5.1)
SODIUM: 140 mmol/L (ref 135–145)

## 2016-11-14 NOTE — Discharge Summary (Signed)
Discharge Summary     Colleen Parks 02-Jun-1935 82 y.o. female  [id number]  Admission Date: 11/13/2016  Discharge Date: 11/14/16  Physician: Angelia Mould, MD  Admission Diagnosis: Asymptomatic stenosis of left carotid artery [I65.22]   HPI:   This is a 82 y.o. female who I saw in consultation on 03/19/2016 with a left carotid stenosis. At that time she had an asymptomatic 60-79% left carotid stenosis. I recommended a follow up duplex scan in 6 months.   Since I saw her last, she denies any history of stroke, TIAs, expressive or receptive aphasia, or amaurosis fugax. 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   Of note she does not tolerate statins. She has not been taking her aspirin.  Hospital Course:  The patient was admitted to the hospital and taken to the operating room on 11/13/2016 and underwent left carotid endarterectomy.  The pt tolerated the procedure well and was transported to the PACU in good condition.  Intraoperative findings as follows:  The plaque was extremely long extending both low down into the common carotid artery and also high up into the internal carotid artery.   By POD 1, the pt neuro status was in tact.  She had been seen in the pacu the afternoon of surgery and had some numbness in right arm and motor completely intact.  Improved after a-line was removed and completely resolved this am.  She was not having any difficulty swallowing.    The remainder of the hospital course consisted of increasing mobilization and increasing intake of solids without difficulty.    Recent Labs  11/12/16 0852 11/14/16 0247  NA 142 140  K 4.6 4.5  CL 107 106  CO2 25 24  GLUCOSE 139* 171*  BUN 40* 29*  CALCIUM 10.2 9.5    Recent Labs  11/13/16 1540 11/14/16 0247  WBC 10.3 10.6*  HGB 10.0* 10.0*  HCT 31.4* 31.8*  PLT 201 212    Recent Labs  11/12/16 0852  INR 0.94     Discharge Instructions    CAROTID Sugery: Call MD for difficulty swallowing or speaking; weakness in arms or legs that is a new symtom; severe headache.  If you have increased swelling in the neck and/or  are having difficulty breathing, CALL 911    Complete by:  As directed    Call MD for:  redness, tenderness, or signs of infection (pain, swelling, bleeding, redness, odor or green/yellow discharge around incision site)    Complete by:  As directed    Call MD for:  severe or increased pain, loss or decreased feeling  in affected limb(s)    Complete by:  As directed    Call MD for:  temperature >100.5    Complete by:  As directed    Discharge wound care:    Complete by:  As directed    Shower daily with soap and water starting 11/15/16   Driving Restrictions    Complete by:  As directed    No driving for 2 weeks   Lifting restrictions    Complete by:  As directed    No lifting for 2 weeks   Resume previous diet    Complete by:  As directed       Discharge Diagnosis:  Asymptomatic stenosis of left carotid artery [I65.22]  Secondary Diagnosis: Patient Active Problem List   Diagnosis Date Noted  . Asymptomatic stenosis of left carotid artery 11/13/2016  .  Arachnoid cyst 08/18/2014  . Glomerulonephritis 08/01/2013  . Hypercholesterolemia   . RAS (renal artery stenosis) ([place])   . Subclavian arterial stenosis ([place])   . Cancer ([place])   . DM (diabetes mellitus) ([place]) 11/19/2011  . MIXED HYPERLIPIDEMIA 08/19/2009  . ESSENTIAL HYPERTENSION, BENIGN 08/19/2009  . Bilateral carotid bruits 08/19/2009   Past Medical History:  Diagnosis Date  . 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 Anxiety   . Cancer ([place])     left breast  . Diabetes mellitus    no longer on medication  . GERD (gastroesophageal reflux disease)   . Glomerulonephritis   . HTN (hypertension)   . Hypercholesterolemia   . RAS (renal artery stenosis) ([place])   . Sleep apnea    no cpap use  . Subclavian arterial stenosis ([facility])     Allergies as of  11/14/2016      Reactions   Codeine Nausea And Vomiting   Metformin And Related Other (See Comments)   Had kidney problems and nephrologist told her never to take it again      Medication List    TAKE these medications   acetaminophen 325 MG tablet Commonly known as:  TYLENOL Take 325 mg by mouth every 6 (six) hours as needed for mild pain.   amLODipine 5 MG tablet Commonly known as:  NORVASC TAKE 1 TABLET BY MOUTH DAILY.   aspirin EC 81 MG tablet Take 81 mg by mouth daily.   cyanocobalamin 1000 MCG tablet Take 1,000 mcg by mouth daily.   FISH OIL PO Take by mouth daily.   Fish Oil 1200 MG Caps Take 1,200 mg by mouth daily.   FLUoxetine 10 MG capsule Commonly known as:  PROZAC Take 10 mg by mouth every other day. Reported on 03/19/2016   gemfibrozil 600 MG tablet Commonly known as:  LOPID Take 600 mg by mouth 2 (two) times daily before a meal.   multivitamin with minerals Tabs tablet Take 1 tablet by mouth daily. For over 50   ranitidine 150 MG tablet Commonly known as:  ZANTAC Take 150 mg by mouth daily as needed for heartburn. For heartburn       Prescriptions given: None given.  Instructions: 1.  No driving x 2 weeks & while taking pain medication 2.  No heavy lifting x 2 weeks 3.  Shower daily with soap and water starting 11/14/16  Disposition: home  Patient's condition: is Good  Follow up: 1. Dr. Scot Dock in 2 weeks.   Leontine Locket, PA-C [facility] [phone]   --- For [facility] use ---   Modified Rankin score at D/C (0-6): 0  IV medication needed for:  1. Hypertension: No 2. Hypotension: No  Post-op Complications: No  1. Post-op CVA or TIA: No  If yes: Event classification (right eye, left eye, right cortical, left cortical, verterobasilar, other): n/a  If yes: Timing of event (intra-op, <6 hrs post-op, >=6 hrs post-op, unknown): n/a  2. CN injury: No  If yes: CN na injuried   3. Myocardial infarction:  No  If yes: Dx by (EKG or clinical, Troponin): n/a  4.  CHF: No  5.  Dysrhythmia (new): No  6. Wound infection: No  7. Reperfusion symptoms: No  8. Return to OR: No  If yes: return to OR for (bleeding, neurologic, other CEA incision, other): n/a  Discharge medications: Statin use:  No   If No:   ASA use:  Yes  If No:   Beta blocker  use:  No ACE-Inhibitor use:  No  ARB use:  No CCB use: Yes P2Y12 Antagonist use: No, [ ]  Plavix, [ ]  Plasugrel, [ ]  Ticlopinine, [ ]  Ticagrelor, [ ]  Other, [ ]  No for medical reason, [ ]  Non-compliant, [ ]  Not-indicated Anti-coagulant use:  No, [ ]  Warfarin, [ ]  Rivaroxaban, [ ]  Dabigatran,

## 2016-11-14 NOTE — Telephone Encounter (Signed)
-----   Message from Mena Goes, RN sent at 11/14/2016  9:09 AM EST ----- Regarding: 2 weeks postop CEA   ----- Message ----- From: Gabriel Earing, PA-C Sent: 11/14/2016   8:09 AM To: Vvs Charge Pool  S/p left CEA.  F/u with [facility] in 2 weeks.

## 2016-11-14 NOTE — Progress Notes (Signed)
  Progress Note    11/14/2016 8:05 AM 1 Day Post-Op  Subjective:  No complaints; no trouble swallowing; has walked.  Numbness in right arm resolved.  Tm 99.6 HR 70's-110's NSR/ST 543'K-067'P systolic 03% RA  Vitals:   11/14/16 0403 11/14/16 0640  BP:  (!) 121/57  Pulse:  91  Resp:  (!) 21  Temp: 99.6 F (37.6 C)      Physical Exam: Neuro:  In tact; tongue is midline Lungs:  Non labored Incision:  Clean and dry without hematoma  CBC    Component Value Date/Time   WBC 10.6 (H) 11/14/2016 0247   RBC 3.46 (L) 11/14/2016 0247   HGB 10.0 (L) 11/14/2016 0247   HCT 31.8 (L) 11/14/2016 0247   PLT 212 11/14/2016 0247   MCV 91.9 11/14/2016 0247   MCH 28.9 11/14/2016 0247   MCHC 31.4 11/14/2016 0247   RDW 12.9 11/14/2016 0247   LYMPHSABS 0.9 11/15/2013 0840   MONOABS 0.6 11/15/2013 0840   EOSABS 0.2 11/15/2013 0840   BASOSABS 0.0 11/15/2013 0840    BMET    Component Value Date/Time   NA 140 11/14/2016 0247   K 4.5 11/14/2016 0247   CL 106 11/14/2016 0247   CO2 24 11/14/2016 0247   GLUCOSE 171 (H) 11/14/2016 0247   BUN 29 (H) 11/14/2016 0247   CREATININE 1.75 (H) 11/14/2016 0247   CALCIUM 9.5 11/14/2016 0247   GFRNONAA 27 (L) 11/14/2016 0247   GFRAA 31 (L) 11/14/2016 0247     Intake/Output Summary (Last 24 hours) at 11/14/16 0805 Last data filed at 11/14/16 0013  Gross per 24 hour  Intake          1535.83 ml  Output             1100 ml  Net           435.83 ml     Assessment/Plan:  This is a 82 y.o. female who is s/p left CEA 1 Day Post-Op  -pt is doing well this am. -pt seen in recovery yesterday with some numbness in right arm and motor completely intact.  Improved after a-line was removed and completely resolved this am. -pt neuro exam is in tact -pt has ambulated -pt has voided -f/u with Dr. Scot Dock in 2 weeks.   Leontine Locket, PA-C [facility] [phone]

## 2016-11-14 NOTE — Telephone Encounter (Signed)
Sched appt 11/26/16 at 12:45. Lm on hm# to inform pt of appt.

## 2016-11-14 NOTE — Progress Notes (Signed)
Discharge Note:  Patient alert and oriented X 4 and in no distress.  Patient given discharge instructions regarding signs and symptoms to report, medications, diet, activity, and upcoming appointments.  Peripheral IVs and Telemetry discontinued. Patient confirmed that she had all of her personal belongings except a tube of nasal ointment that she said she had in the surgical area and was not returned to her.  Patient transported out via wheelchair by hospital volunteer.

## 2016-11-19 ENCOUNTER — Encounter: Payer: Self-pay | Admitting: Vascular Surgery

## 2016-11-19 ENCOUNTER — Ambulatory Visit (INDEPENDENT_AMBULATORY_CARE_PROVIDER_SITE_OTHER): Payer: Self-pay | Admitting: Vascular Surgery

## 2016-11-19 VITALS — BP 170/75 | HR 84 | Temp 97.3°F | Resp 16 | Ht 66.0 in | Wt 139.0 lb

## 2016-11-19 DIAGNOSIS — I6523 Occlusion and stenosis of bilateral carotid arteries: Secondary | ICD-10-CM

## 2016-11-19 NOTE — Progress Notes (Signed)
   Patient name: Colleen Parks MRN: [id number] DOB: 1935/04/07 Sex: female  REASON FOR VISIT: Follow up after left carotid endarterectomy  HPI: Colleen Parks is a 82 y.o. female who was found to have a greater than 80% left carotid stenosis with a 40-59% right carotid stenosis. Left carotid endarterectomy was recommended in order to lower her risk of future stroke. She underwent a left carotid endarterectomy with Dacron patch angioplasty on 11/13/2016. Of note the plaque extended very low and also very high. She did well postoperatively and was discharged on postoperative day #1. She returns for her first outpatient visit.  The patient is doing well. She does have some jaw discomfort which is not surprising given the extent of the dissection required. She has no focal weakness or paresthesias.  Current Outpatient Prescriptions  Medication Sig Dispense Refill  . acetaminophen (TYLENOL) 325 MG tablet Take 325 mg by mouth every 6 (six) hours as needed for mild pain.    Marland Kitchen amLODipine (NORVASC) 5 MG tablet TAKE 1 TABLET BY MOUTH DAILY. 30 tablet 8  . aspirin EC 81 MG tablet Take 81 mg by mouth daily.    . cyanocobalamin 1000 MCG tablet Take 1,000 mcg by mouth daily.    Marland Kitchen FLUoxetine (PROZAC) 10 MG capsule Take 10 mg by mouth every other day. Reported on 03/19/2016    . gemfibrozil (LOPID) 600 MG tablet Take 600 mg by mouth 2 (two) times daily before a meal.    . Multiple Vitamin (MULTIVITAMIN WITH MINERALS) TABS tablet Take 1 tablet by mouth daily. For over 18    . Omega-3 Fatty Acids (FISH OIL PO) Take by mouth daily.    . Omega-3 Fatty Acids (FISH OIL) 1200 MG CAPS Take 1,200 mg by mouth daily.    . ranitidine (ZANTAC) 150 MG tablet Take 150 mg by mouth daily as needed for heartburn. For heartburn    . mupirocin ointment (BACTROBAN) 2 %      No current facility-administered medications for this visit.     REVIEW OF SYSTEMS:  [X]  denotes positive finding, [ ]  denotes negative  finding Cardiac  Comments:  Chest pain or chest pressure:    Shortness of breath upon exertion:    Short of breath when lying flat:    Irregular heart rhythm:    Constitutional    Fever or chills:      PHYSICAL EXAM: Vitals:   11/19/16 1612 11/19/16 1614  BP: (!) 179/70 (!) 170/75  Pulse: 86 84  Resp: 16   Temp: 97.3 F (36.3 C)   SpO2: 98%   Weight: 139 lb (63 kg)   Height: 5\' 6"  (1.676 m)     GENERAL: The patient is a well-nourished female, in no acute distress. The vital signs are documented above. CARDIOVASCULAR: There is a regular rate and rhythm. PULMONARY: There is good air exchange bilaterally without wheezing or rales. Her left neck incision is healing nicely. NEURO: She has no focal weakness or paresthesias.  MEDICAL ISSUES:  STATUS POST LEFT CAROTID ENDARTERECTOMY: The patient is doing well status post left carotid endarterectomy. She is on aspirin. She cannot take statins as she is intolerant to them. She is not a smoker. She has a 40-59% right carotid stenosis. I've ordered a follow up carotid duplex scan in 6 months and I'll see her back at that time. She knows to call sooner if she has problems.  [name] [facility] [place] [phone]

## 2016-11-21 DIAGNOSIS — N184 Chronic kidney disease, stage 4 (severe): Secondary | ICD-10-CM | POA: Diagnosis not present

## 2016-11-21 DIAGNOSIS — R801 Persistent proteinuria, unspecified: Secondary | ICD-10-CM | POA: Diagnosis not present

## 2016-11-21 DIAGNOSIS — I10 Essential (primary) hypertension: Secondary | ICD-10-CM | POA: Diagnosis not present

## 2016-11-21 DIAGNOSIS — I776 Arteritis, unspecified: Secondary | ICD-10-CM | POA: Diagnosis not present

## 2016-11-26 ENCOUNTER — Encounter: Payer: PPO | Admitting: Vascular Surgery

## 2017-02-13 NOTE — Addendum Note (Signed)
Addendum  created 02/13/17 1213 by Rica Koyanagi, MD   Sign clinical note

## 2017-02-25 ENCOUNTER — Encounter (HOSPITAL_COMMUNITY): Payer: PPO

## 2017-02-25 ENCOUNTER — Ambulatory Visit: Payer: PPO | Admitting: Vascular Surgery

## 2017-03-05 DIAGNOSIS — L570 Actinic keratosis: Secondary | ICD-10-CM | POA: Diagnosis not present

## 2017-03-05 DIAGNOSIS — L820 Inflamed seborrheic keratosis: Secondary | ICD-10-CM | POA: Diagnosis not present

## 2017-03-05 DIAGNOSIS — X32XXXD Exposure to sunlight, subsequent encounter: Secondary | ICD-10-CM | POA: Diagnosis not present

## 2017-03-10 DIAGNOSIS — Z008 Encounter for other general examination: Secondary | ICD-10-CM | POA: Diagnosis not present

## 2017-03-17 DIAGNOSIS — N183 Chronic kidney disease, stage 3 (moderate): Secondary | ICD-10-CM | POA: Diagnosis not present

## 2017-03-17 DIAGNOSIS — I10 Essential (primary) hypertension: Secondary | ICD-10-CM | POA: Diagnosis not present

## 2017-03-17 DIAGNOSIS — E119 Type 2 diabetes mellitus without complications: Secondary | ICD-10-CM | POA: Diagnosis not present

## 2017-03-17 DIAGNOSIS — N3941 Urge incontinence: Secondary | ICD-10-CM | POA: Diagnosis not present

## 2017-04-07 DIAGNOSIS — E119 Type 2 diabetes mellitus without complications: Secondary | ICD-10-CM | POA: Diagnosis not present

## 2017-04-07 DIAGNOSIS — H524 Presbyopia: Secondary | ICD-10-CM | POA: Diagnosis not present

## 2017-04-07 DIAGNOSIS — H52223 Regular astigmatism, bilateral: Secondary | ICD-10-CM | POA: Diagnosis not present

## 2017-04-07 DIAGNOSIS — H5203 Hypermetropia, bilateral: Secondary | ICD-10-CM | POA: Diagnosis not present

## 2017-05-03 NOTE — Progress Notes (Signed)
Patient ID: Colleen Parks, female   DOB: [birth year], 82 y.o.   MRN: [id number]   Colleen Parks is seen today for F/U of elevated lipids, HTN. She has previously been seen by Helayne Seminole from [facility].  CRF's include elevated lipid and HTN which is finally Rx. She also had a HbA1c of 7.3 and has begun Rx for type 2 DM. She is active in the garden and bowls    Glomerulonephritis in remission  [facility]  Then told me they didn't get enough tissue to make diagnosis  Off steroids  Had right brain cyst that caused temporary RUE weakness That is now better   Had left CEA with Dr Doren Custard 11/2016   Intolerant to multiple statins in past LDL followed by primary and still very high.  Discussed PSK9 as did her primary but she is terrified to given herself shots  She will speak with Dr Karie Kirks   ROS: Denies fever, malais, weight loss, blurry vision, decreased visual acuity, cough, sputum, SOB, hemoptysis, pleuritic pain, palpitaitons, heartburn, abdominal pain, melena, lower extremity edema, claudication, or rash.  All other systems reviewed and negative  General: Affect appropriate Healthy:  appears stated age 14: normal Neck supple with no adenopathy JVP normal post left CEA no  bruits no thyromegaly Lungs clear with no wheezing and good diaphragmatic motion Heart:  S1/S2 no murmur, no rub, gallop or click PMI normal Abdomen: benighn, BS positve, no tenderness, no AAA no bruit.  No HSM or HJR Distal pulses intact with no bruits No edema Neuro non-focal Skin warm and dry No muscular weakness    Current Outpatient Prescriptions  Medication Sig Dispense Refill  . acetaminophen (TYLENOL) 325 MG tablet Take 325 mg by mouth every 6 (six) hours as needed for mild pain.    Marland Kitchen amLODipine (NORVASC) 5 MG tablet TAKE 1 TABLET BY MOUTH DAILY. 30 tablet 8  . cyanocobalamin 1000 MCG tablet Take 1,000 mcg by mouth daily.    Marland Kitchen FLUoxetine (PROZAC) 10 MG capsule  Take 10 mg by mouth daily.    Marland Kitchen gemfibrozil (LOPID) 600 MG tablet Take 600 mg by mouth 2 (two) times daily before a meal.    . Multiple Vitamin (MULTIVITAMIN WITH MINERALS) TABS tablet Take 1 tablet by mouth daily. For over 30    . mupirocin ointment (BACTROBAN) 2 %     . Omega-3 Fatty Acids (FISH OIL PO) Take by mouth daily.    . Omega-3 Fatty Acids (FISH OIL) 1200 MG CAPS Take 1,200 mg by mouth daily.    Marland Kitchen oxybutynin (DITROPAN-XL) 5 MG 24 hr tablet Take 5 mg by mouth daily.    . ranitidine (ZANTAC) 150 MG tablet Take 150 mg by mouth daily as needed for heartburn. For heartburn     No current facility-administered medications for this visit.     Allergies  Codeine and Metformin and related  Electrocardiogram: 11/14  SR rate 74 normal 08/2014  no change NSR normal ECG  09/14/15 SR rate 65 normal no change  10/16/16  SR rate 74  Normal   Assessment and Plan Carotid:  Left CEA [place] 11/13/16   HTN:  Well controlled.  Continue current medications and low sodium Dash type diet.    Chol:  F/U primary Strongly advised her to see our cholesterol clinic and start PSK9 drug  Glomerulonephritis  Improved labs with primary   Lab Results  Component Value Date   CREATININE 1.75 (H) 11/14/2016  BUN 29 (H) 11/14/2016   NA 140 11/14/2016   K 4.5 11/14/2016   CL 106 11/14/2016   CO2 24 11/14/2016    Arachnoid Cyst  Recent CT with stable size and no recurrent UE weakness  GERD  Low carb diet and zantac    F/U 6 months   [name]

## 2017-05-05 ENCOUNTER — Ambulatory Visit (INDEPENDENT_AMBULATORY_CARE_PROVIDER_SITE_OTHER): Payer: PPO | Admitting: Cardiovascular Disease

## 2017-05-05 ENCOUNTER — Encounter: Payer: Self-pay | Admitting: Cardiovascular Disease

## 2017-05-05 VITALS — BP 130/62 | HR 76 | Ht 66.0 in | Wt 136.8 lb

## 2017-05-05 DIAGNOSIS — I779 Disorder of arteries and arterioles, unspecified: Secondary | ICD-10-CM

## 2017-05-05 DIAGNOSIS — I739 Peripheral vascular disease, unspecified: Principal | ICD-10-CM

## 2017-05-05 NOTE — Patient Instructions (Signed)

## 2017-05-11 ENCOUNTER — Telehealth: Payer: Self-pay | Admitting: Cardiovascular Disease

## 2017-05-11 NOTE — Telephone Encounter (Signed)
New message    Pt is calling stating someone was supposed to call her on Friday but she didn't get a call about her labs. She said she is leaving and wont be back until after 4 today. She said please call after then.

## 2017-05-11 NOTE — Telephone Encounter (Signed)
Patient wanted lipid clinic appt. Made one for this Friday. Patient verbalized understanding.

## 2017-05-15 ENCOUNTER — Ambulatory Visit (INDEPENDENT_AMBULATORY_CARE_PROVIDER_SITE_OTHER): Payer: PPO | Admitting: Pharmacist

## 2017-05-15 DIAGNOSIS — E782 Mixed hyperlipidemia: Secondary | ICD-10-CM | POA: Diagnosis not present

## 2017-05-15 MED ORDER — ROSUVASTATIN CALCIUM 5 MG PO TABS: 5.0000 mg | ORAL_TABLET | Freq: Every day | ORAL | 1 refills | Status: DC

## 2017-05-15 MED ORDER — FENOFIBRATE 48 MG PO TABS: 48.0000 mg | ORAL_TABLET | Freq: Every day | ORAL | 1 refills | Status: DC

## 2017-05-15 NOTE — Progress Notes (Signed)
Patient ID: Colleen Parks                 DOB: 02/11/1935                    MRN: [id number]     HPI: Colleen Parks is a 82 y.o. female patient referred to lipid clinic by Dr. Johnsie Cancel. PMH is significant for hypertension, renal artery stenosis, subclavian arterial stenosis, left-sided carotid artery disease, diabetes, hyperlipidemia, and breast cancer. Pt reported intolerances to multiple statins. LDL followed by PCP. Pt saw Dr. Johnsie Cancel on 05/05/17 and PCSK9 therapy was discussed. However, pt is terrified to give herself injections. She is referred to lipid clinic for further discussion and potentially starting PCSK9 therapy.   Pt presents to clinic with husband. She reports that her LDL was 337 most recently in March. Says everybody in her family has high cholesterol. Pt states that she experienced leg pain with every statin she has tried. She reports she was taking gemfibrozil together with statins when she was on them in the past. Pt prefers to try oral options before moving to PCSK9 inhibitors because she is worried about giving herself injections as well as the cost.   Current Medications: gemfibrozil 600 mg BID  Intolerances:  Crestor 20 mg every 3 days- muscle, leg pain Lovastatin 20 mg - muscle, leg pain  Pravastatin 80 mg - muscle, leg pain   Risk Factors: Diabetes   LDL goal: < 70 mg/dL  Diet: Eats Cheerios for breakfast. Eats fried fish, chicken, pork chops. Has garden, eats vegetables. Drinks diet soda and unsweetened tea.  Exercise: Takes dance classes, 3x a week. Bowls once a week and has exercise classes once a week.    Family History: Father - CAD  Social History: Former smoker, quit in 1960. Denies alcohol or illicit drug use.   Labs: No recent labs in the past   Past Medical History:  Diagnosis Date  . 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 Anxiety   . Cancer ([place])     left breast  . Diabetes mellitus    no longer on medication  . GERD (gastroesophageal  reflux disease)   . Glomerulonephritis   . HTN (hypertension)   . Hypercholesterolemia   . RAS (renal artery stenosis) ([place])   . Sleep apnea    no cpap use  . Subclavian arterial stenosis [facility])     Current Outpatient Prescriptions on File Prior to Visit  Medication Sig Dispense Refill  . acetaminophen (TYLENOL) 325 MG tablet Take 325 mg by mouth every 6 (six) hours as needed for mild pain.    Marland Kitchen amLODipine (NORVASC) 5 MG tablet TAKE 1 TABLET BY MOUTH DAILY. 30 tablet 8  . cyanocobalamin 1000 MCG tablet Take 1,000 mcg by mouth daily.    Marland Kitchen FLUoxetine (PROZAC) 10 MG capsule Take 10 mg by mouth daily.    Marland Kitchen gemfibrozil (LOPID) 600 MG tablet Take 600 mg by mouth 2 (two) times daily before a meal.    . Multiple Vitamin (MULTIVITAMIN WITH MINERALS) TABS tablet Take 1 tablet by mouth daily. For over 49    . mupirocin ointment (BACTROBAN) 2 %     . Omega-3 Fatty Acids (FISH OIL PO) Take by mouth daily.    . Omega-3 Fatty Acids (FISH OIL) 1200 MG CAPS Take 1,200 mg by mouth daily.    Marland Kitchen oxybutynin (DITROPAN-XL) 5 MG 24 hr tablet Take 5 mg by mouth daily.    . ranitidine (  ZANTAC) 150 MG tablet Take 150 mg by mouth daily as needed for heartburn. For heartburn     No current facility-administered medications on file prior to visit.     Allergies  Allergen Reactions  . Codeine Nausea And Vomiting  . Metformin And Related Other (See Comments)    Had kidney problems and nephrologist told her never to take it again    Assessment/Plan:  Hyperlipidemia: Pt reported that her most recent LDL was very elevated at 337. Given cardiac hx, want her LDL <70. Discussed safety, efficacy, and injection technique regarding PCSK9 inhibitors. Pt is open to trying PCSK9i therapy, but would like to try other options first. Pt's muscle symptoms may be caused by taking gemfibrozil and statins together. Will stop gemfibrozil 600 mg BID. Start fenofibrate 48 mg daily and rosuvastatin 5 mg three times weekly. Pt is  scheduled to see PCP on 07/15/17 for lipid panel, plan to have their office fax results to the clinic. F/u with pt on 07/21/17 for lipid evaluation.  -Barkley Boards, PharmD Student   Thank you, Lelan Pons. [facility], [place]  [address] [place],  [address]  Phone: [phone]; Fax: [phone]

## 2017-05-15 NOTE — Patient Instructions (Addendum)
It was good seeing you today.  Based on your lab results most recently in March, your cholesterol is elevated.  Stop gemfibrozil 600 mg.   Start fenofibrate 48 mg 1 tablet by mouth once daily and rosuvastatin 5 mg 1 tablet by mouth 3 times per week. This combination is less likely to cause muscle symptoms.  Try to eat a low fat, healthy diet rich in vegetables and lean protein like fish and chicken. Continue your exercise regimen.  Monitor yourself for muscle or joint pain. Please contact the clinic with any problems or concerns. Phone: [phone]  Follow up with your primary care doctor as scheduled on 07/15/17. Please have them fax the results of your lab work to our clinic. Fax: [phone]  Follow up in clinic for evaluation in 3 months on 07/21/2017 at 10:30 AM.

## 2017-06-03 ENCOUNTER — Ambulatory Visit: Payer: PPO | Admitting: Vascular Surgery

## 2017-06-03 ENCOUNTER — Encounter (HOSPITAL_COMMUNITY): Payer: PPO

## 2017-07-06 DIAGNOSIS — X32XXXD Exposure to sunlight, subsequent encounter: Secondary | ICD-10-CM | POA: Diagnosis not present

## 2017-07-06 DIAGNOSIS — C44519 Basal cell carcinoma of skin of other part of trunk: Secondary | ICD-10-CM | POA: Diagnosis not present

## 2017-07-06 DIAGNOSIS — L570 Actinic keratosis: Secondary | ICD-10-CM | POA: Diagnosis not present

## 2017-07-08 ENCOUNTER — Ambulatory Visit (HOSPITAL_COMMUNITY)
Admission: RE | Admit: 2017-07-08 | Discharge: 2017-07-08 | Disposition: A | Discharge status: Home / Self Care | Payer: PPO | Source: Ambulatory Visit | Attending: Vascular Surgery | Admitting: Vascular Surgery

## 2017-07-08 ENCOUNTER — Ambulatory Visit (INDEPENDENT_AMBULATORY_CARE_PROVIDER_SITE_OTHER): Payer: PPO | Admitting: Vascular Surgery

## 2017-07-08 ENCOUNTER — Encounter: Payer: Self-pay | Admitting: Vascular Surgery

## 2017-07-08 VITALS — BP 174/80 | HR 72 | Temp 97.5°F | Resp 18 | Ht 66.0 in | Wt 139.0 lb

## 2017-07-08 DIAGNOSIS — I6523 Occlusion and stenosis of bilateral carotid arteries: Secondary | ICD-10-CM

## 2017-07-08 LAB — VAS US CAROTID
LCCAPDIAS: 30 cm/s
LEFT ECA DIAS: -19 cm/s
LICADDIAS: -39 cm/s
LICADSYS: -112 cm/s
LICAPDIAS: 46 cm/s
LICAPSYS: 133 cm/s
Left CCA dist dias: 14 cm/s
Left CCA dist sys: 64 cm/s
Left CCA prox sys: 122 cm/s
RIGHT CCA MID DIAS: 16 cm/s
RIGHT ECA DIAS: -35 cm/s
Right CCA prox dias: 15 cm/s
Right CCA prox sys: 85 cm/s
Right cca dist sys: -75 cm/s

## 2017-07-08 NOTE — Progress Notes (Signed)
Patient name: Colleen Parks MRN: [id number] DOB: [birth year] Sex: female  REASON FOR VISIT:   Follow-up of carotid disease.  HPI:   Colleen Parks is a pleasant 82 y.o. female who I last saw on 11/19/2016.  She had a left carotid endarterectomy on 11/13/2016.  She also had a known 40-59% right carotid stenosis.  She comes in for a six-month follow-up visit.  She has no specific complaints.  She denies focal weakness or paresthesias.  She denies expressive or receptive aphasia.  She denies amaurosis fugax.  She was not taking aspirin but she is on Crestor.  Past Medical History:  Diagnosis Date  . 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 Anxiety   . Cancer ([place])     left breast  . Diabetes mellitus    no longer on medication  . GERD (gastroesophageal reflux disease)   . Glomerulonephritis   . HTN (hypertension)   . Hypercholesterolemia   . RAS (renal artery stenosis) ([place])   . Sleep apnea    no cpap use  . Subclavian arterial stenosis ([facility])     Family History  Problem Relation Age of Onset  . Coronary artery disease Unknown        positive for premature- on fathers side  . Anesthesia problems Neg Hx   . Hypotension Neg Hx   . Malignant hyperthermia Neg Hx   . Pseudochol deficiency Neg Hx     SOCIAL HISTORY: Social History  Substance Use Topics  . Smoking status: Former Smoker    Packs/day: 0.50    Years: 4.00    Types: Cigarettes    Quit date: 12/09/1958  . Smokeless tobacco: Never Used     Comment: non-smoker  . Alcohol use No    Allergies  Allergen Reactions  . Codeine Nausea And Vomiting  . Metformin And Related Other (See Comments)    Had kidney problems and nephrologist told her never to take it again    Current Outpatient Prescriptions  Medication Sig Dispense Refill  . acetaminophen (TYLENOL) 325 MG tablet Take 325 mg by mouth every 6 (six) hours as needed for mild pain.    Marland Kitchen amLODipine (NORVASC) 5 MG tablet TAKE 1 TABLET BY MOUTH DAILY. 30 tablet 8   . cyanocobalamin 1000 MCG tablet Take 1,000 mcg by mouth daily.    . fenofibrate (TRICOR) 48 MG tablet Take 1 tablet (48 mg total) by mouth daily. 90 tablet 1  . FLUoxetine (PROZAC) 10 MG capsule Take 10 mg by mouth daily.    . Multiple Vitamin (MULTIVITAMIN WITH MINERALS) TABS tablet Take 1 tablet by mouth daily. For over 74    . mupirocin ointment (BACTROBAN) 2 %     . Omega-3 Fatty Acids (FISH OIL PO) Take by mouth daily.    Marland Kitchen oxybutynin (DITROPAN-XL) 5 MG 24 hr tablet Take 5 mg by mouth daily.    . ranitidine (ZANTAC) 150 MG tablet Take 150 mg by mouth daily as needed for heartburn. For heartburn    . rosuvastatin (CRESTOR) 5 MG tablet Take 1 tablet (5 mg total) by mouth daily. 90 tablet 1  . Omega-3 Fatty Acids (FISH OIL) 1200 MG CAPS Take 1,200 mg by mouth daily.     No current facility-administered medications for this visit.     REVIEW OF SYSTEMS:  [X]  denotes positive finding, [ ]  denotes negative finding Cardiac  Comments:  Chest pain or chest pressure:    Shortness of breath upon exertion:  Short of breath when lying flat:    Irregular heart rhythm:        Vascular    Pain in calf, thigh, or hip brought on by ambulation:    Pain in feet at night that wakes you up from your sleep:     Blood clot in your veins:    Leg swelling:         Pulmonary    Oxygen at home:    Productive cough:     Wheezing:         Neurologic    Sudden weakness in arms or legs:     Sudden numbness in arms or legs:     Sudden onset of difficulty speaking or slurred speech:    Temporary loss of vision in one eye:     Problems with dizziness:         Gastrointestinal    Blood in stool:     Vomited blood:         Genitourinary    Burning when urinating:     Blood in urine:        Psychiatric    Major depression:         Hematologic    Bleeding problems:    Problems with blood clotting too easily:        Skin    Rashes or ulcers:        Constitutional    Fever or chills:      PHYSICAL EXAM:   Vitals:   07/08/17 1006 07/08/17 1007  BP: (!) 176/70 (!) 174/80  Pulse: 72   Resp: 18   Temp: (!) 97.5 F (36.4 C)   TempSrc: Oral   SpO2: 99%   Weight: 139 lb (63 kg)   Height: 5\' 6"  (1.676 m)     GENERAL: The patient is a well-nourished female, in no acute distress. The vital signs are documented above. CARDIAC: There is a regular rate and rhythm.  VASCULAR: She has a right carotid bruit. PULMONARY: There is good air exchange bilaterally without wheezing or rales. ABDOMEN: Soft and non-tender with normal pitched bowel sounds.  MUSCULOSKELETAL: There are no major deformities or cyanosis. NEUROLOGIC: No focal weakness or paresthesias are detected. SKIN: There are no ulcers or rashes noted. PSYCHIATRIC: The patient has a normal affect.  DATA:    CAROTID DUPLEX: I have independently interpreted her carotid duplex scan today.  The left carotid endarterectomy site is widely patent.  There is a 40-59% right carotid stenosis.  MEDICAL ISSUES:   BILATERAL CAROTID DISEASE: Her left carotid endarterectomy site is widely patent.  She has a 40-59% right carotid stenosis.  I have instructed her to start taking her aspirin again.  It is not clear why she stopped taking this.  She is on a statin.  She is not a smoker.  I have ordered a follow-up carotid duplex scan in 1 year and I will see her back at that time.  She knows to call sooner if she has problems.  [name] [facility] [phone]

## 2017-07-13 DIAGNOSIS — N184 Chronic kidney disease, stage 4 (severe): Secondary | ICD-10-CM | POA: Diagnosis not present

## 2017-07-15 DIAGNOSIS — E782 Mixed hyperlipidemia: Secondary | ICD-10-CM | POA: Diagnosis not present

## 2017-07-15 DIAGNOSIS — I779 Disorder of arteries and arterioles, unspecified: Secondary | ICD-10-CM | POA: Diagnosis not present

## 2017-07-15 DIAGNOSIS — E119 Type 2 diabetes mellitus without complications: Secondary | ICD-10-CM | POA: Diagnosis not present

## 2017-07-15 DIAGNOSIS — Z23 Encounter for immunization: Secondary | ICD-10-CM | POA: Diagnosis not present

## 2017-07-15 DIAGNOSIS — I10 Essential (primary) hypertension: Secondary | ICD-10-CM | POA: Diagnosis not present

## 2017-07-20 NOTE — Progress Notes (Addendum)
Patient ID: Colleen Parks                 DOB: October 30, 1934                    MRN: [id number]     HPI: Colleen Parks is a 82 y.o. female patient referred to lipid clinic by Dr. Johnsie Cancel. PMH is significant for HTN, renal artery stenosis, subclavian arterial stenosis, left-sided carotid artery disease, diabetes, HLD, and breast cancer. Pt reported intolerances to multiple statins. LDL followed by PCP. PCSK9 therapy has been discussed previously, but patient is terrified to give herself injections and also worried about the cost. Fenofibrate and rosuvastatin started at last visit in August. She reports that her most recent LDL was 337 in March and that everyone in her family has high cholesterol.  Patient is taking her Crestor 5mg  every day and fenofibrate daily and tolerating them well. She reports that she stopped taking her fish oil because it wasn't doing anything. Had an appt with her kidney doctor Thursday and he said her kidney function is elevated again.  Current Medications: fenofibrate 48mg  daily, rosuvastatin 5mg  daily Intolerances: rosuvastatin 20mg  every 3 days - muscle/leg pain, lovastatin 20mg  - muscle/leg pain, pravastatin 80mg  - muscle/leg pain Risk Factors: diabetes, CAD, family history, HTN, age LDL goal: < 70mg /dL  Diet:  Breakfast - Eats Cheerios with bananas and a cup of coffee for breakfast. Lunch - Chicken and pears with milk. Dinner - Butter beans, Hamburger pinto bean and potato casserole. Has garden, eats vegetables that she cans herself.  Drinks plenty of whole milk, one diet soda/day, and unsweetened tea. Typically drinks 1 cup of coffee daily, unless she goes out to eat and she will have another.  Exercise: Takes line dancing classes, 3x a week. Bowls twice a week and has exercise classes once a week. Went to the senior games in [place] and came in 2nd in [place]. Also plays horseshoes and shoots some basketball at the games.  Family History: Father - CAD  (died at 82), Brother - stent (died at 30), Sister - Diabetes  Social History: Former smoker, quit in 1960. Smoked 1PPD for 3-4 years. Denies alcohol or illicit drug use.    Labs: No lipid panels on file  Past Medical History:  Diagnosis Date  . 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 Anxiety   . Cancer ([place])     left breast  . Diabetes mellitus    no longer on medication  . GERD (gastroesophageal reflux disease)   . Glomerulonephritis   . HTN (hypertension)   . Hypercholesterolemia   . RAS (renal artery stenosis) ([place])   . Sleep apnea    no cpap use  . Subclavian arterial stenosis ([facility])     Current Outpatient Medications on File Prior to Visit  Medication Sig Dispense Refill  . acetaminophen (TYLENOL) 325 MG tablet Take 325 mg by mouth every 6 (six) hours as needed for mild pain.    Marland Kitchen amLODipine (NORVASC) 5 MG tablet TAKE 1 TABLET BY MOUTH DAILY. 30 tablet 8  . cyanocobalamin 1000 MCG tablet Take 1,000 mcg by mouth daily.    . fenofibrate (TRICOR) 48 MG tablet Take 1 tablet (48 mg total) by mouth daily. 90 tablet 1  . FLUoxetine (PROZAC) 10 MG capsule Take 10 mg by mouth daily.    . Multiple Vitamin (MULTIVITAMIN WITH MINERALS) TABS tablet Take 1 tablet by mouth daily. For over 79    .  mupirocin ointment (BACTROBAN) 2 %     . Omega-3 Fatty Acids (FISH OIL PO) Take by mouth daily.    . Omega-3 Fatty Acids (FISH OIL) 1200 MG CAPS Take 1,200 mg by mouth daily.    Marland Kitchen oxybutynin (DITROPAN-XL) 5 MG 24 hr tablet Take 5 mg by mouth daily.    . ranitidine (ZANTAC) 150 MG tablet Take 150 mg by mouth daily as needed for heartburn. For heartburn    . rosuvastatin (CRESTOR) 5 MG tablet Take 1 tablet (5 mg total) by mouth daily. 90 tablet 1   No current facility-administered medications on file prior to visit.     Allergies  Allergen Reactions  . Codeine Nausea And Vomiting  . Metformin And Related Other (See Comments)    Had kidney problems and nephrologist told her never to take it  again    Assessment/Plan:  1. Hyperlipidemia - LDL goal < 70 due to history of CAD. She is currently tolerating Crestor 5mg  daily and fenofibrate 48mg  daily. Will check her lipids today and call with her results tomorrow. For now we will continue her fenofibrate and rosuvastatin. Will plan to increase her rosuvastatin to 10mg  daily if her LDL remains above goal.  Patient seen with Levonne Lapping, PharmD Candidate  Colleen Parks, PharmD, CPP, [place] [address] [place], [place] [address] Phone: [phone]; Fax: [phone] 07/21/2017 11:25 AM   Addendum: Direct LDL result was 132 today, this is above her goal of < 70 mg/dL. Spoke with patient to advise her to increase her Crestor to 10mg  daily. Follow up lipid panel has been scheduled in 3 months.

## 2017-07-21 ENCOUNTER — Ambulatory Visit (INDEPENDENT_AMBULATORY_CARE_PROVIDER_SITE_OTHER): Payer: PPO | Admitting: Pharmacist

## 2017-07-21 DIAGNOSIS — E7800 Pure hypercholesterolemia, unspecified: Secondary | ICD-10-CM | POA: Diagnosis not present

## 2017-07-21 LAB — LIPID PANEL
CHOLESTEROL TOTAL: 216 mg/dL — AB (ref 100–199)
Chol/HDL Ratio: 3.8 ratio (ref 0.0–4.4)
HDL: 57 mg/dL (ref >39)
LDL Calculated: 111 mg/dL — ABNORMAL HIGH (ref 0–99)
Triglycerides: 240 mg/dL — ABNORMAL HIGH (ref 0–149)
VLDL CHOLESTEROL CAL: 48 mg/dL — AB (ref 5–40)

## 2017-07-21 LAB — LDL CHOLESTEROL, DIRECT: LDL DIRECT: 132 mg/dL — AB (ref 0–99)

## 2017-07-22 MED ORDER — ROSUVASTATIN CALCIUM 10 MG PO TABS: 10.0000 mg | ORAL_TABLET | Freq: Every day | ORAL | 11 refills | Status: DC

## 2017-07-22 NOTE — Addendum Note (Signed)
Addended by: Marri Mcneff E on: 07/22/2017 08:34 AM   Modules accepted: Orders

## 2017-07-22 NOTE — Addendum Note (Signed)
Addended by: Lianne Cure A on: 07/22/2017 03:18 PM   Modules accepted: Orders

## 2017-07-23 DIAGNOSIS — I10 Essential (primary) hypertension: Secondary | ICD-10-CM | POA: Diagnosis not present

## 2017-07-23 DIAGNOSIS — N184 Chronic kidney disease, stage 4 (severe): Secondary | ICD-10-CM | POA: Diagnosis not present

## 2017-07-23 DIAGNOSIS — R801 Persistent proteinuria, unspecified: Secondary | ICD-10-CM | POA: Diagnosis not present

## 2017-07-23 DIAGNOSIS — I776 Arteritis, unspecified: Secondary | ICD-10-CM | POA: Diagnosis not present

## 2017-08-15 DIAGNOSIS — C50012 Malignant neoplasm of nipple and areola, left female breast: Secondary | ICD-10-CM | POA: Diagnosis not present

## 2017-09-14 ENCOUNTER — Other Ambulatory Visit (HOSPITAL_COMMUNITY): Payer: Self-pay | Admitting: Family Medicine

## 2017-09-14 DIAGNOSIS — Z1231 Encounter for screening mammogram for malignant neoplasm of breast: Secondary | ICD-10-CM

## 2017-09-23 ENCOUNTER — Encounter (HOSPITAL_COMMUNITY): Payer: Self-pay

## 2017-09-23 ENCOUNTER — Ambulatory Visit (HOSPITAL_COMMUNITY)
Admission: RE | Admit: 2017-09-23 | Discharge: 2017-09-23 | Disposition: A | Discharge status: Home / Self Care | Payer: PPO | Source: Ambulatory Visit | Attending: Family Medicine | Admitting: Family Medicine

## 2017-09-23 DIAGNOSIS — Z1231 Encounter for screening mammogram for malignant neoplasm of breast: Secondary | ICD-10-CM | POA: Diagnosis not present

## 2017-09-24 DIAGNOSIS — L570 Actinic keratosis: Secondary | ICD-10-CM | POA: Diagnosis not present

## 2017-09-24 DIAGNOSIS — X32XXXD Exposure to sunlight, subsequent encounter: Secondary | ICD-10-CM | POA: Diagnosis not present

## 2017-09-24 DIAGNOSIS — Z08 Encounter for follow-up examination after completed treatment for malignant neoplasm: Secondary | ICD-10-CM | POA: Diagnosis not present

## 2017-09-24 DIAGNOSIS — C44329 Squamous cell carcinoma of skin of other parts of face: Secondary | ICD-10-CM | POA: Diagnosis not present

## 2017-09-24 DIAGNOSIS — Z85828 Personal history of other malignant neoplasm of skin: Secondary | ICD-10-CM | POA: Diagnosis not present

## 2017-10-20 ENCOUNTER — Other Ambulatory Visit: Payer: PPO

## 2017-10-20 DIAGNOSIS — E7800 Pure hypercholesterolemia, unspecified: Secondary | ICD-10-CM

## 2017-10-20 LAB — HEPATIC FUNCTION PANEL
ALK PHOS: 80 IU/L (ref 39–117)
ALT: 18 IU/L (ref 0–32)
AST: 22 IU/L (ref 0–40)
Albumin: 4.5 g/dL (ref 3.5–4.7)
Bilirubin Total: 0.2 mg/dL (ref 0.0–1.2)
Bilirubin, Direct: 0.08 mg/dL (ref 0.00–0.40)
TOTAL PROTEIN: 7.1 g/dL (ref 6.0–8.5)

## 2017-10-20 LAB — LIPID PANEL
Chol/HDL Ratio: 3.4 ratio (ref 0.0–4.4)
Cholesterol, Total: 195 mg/dL (ref 100–199)
HDL: 58 mg/dL (ref >39)
LDL Calculated: 102 mg/dL — ABNORMAL HIGH (ref 0–99)
Triglycerides: 177 mg/dL — ABNORMAL HIGH (ref 0–149)
VLDL CHOLESTEROL CAL: 35 mg/dL (ref 5–40)

## 2017-10-23 ENCOUNTER — Telehealth: Payer: Self-pay

## 2017-10-23 ENCOUNTER — Telehealth: Payer: Self-pay | Admitting: Cardiovascular Disease

## 2017-10-23 MED ORDER — ROSUVASTATIN CALCIUM 10 MG PO TABS: 5.0000 mg | ORAL_TABLET | Freq: Every day | ORAL | 3 refills | Status: DC

## 2017-10-23 MED ORDER — EZETIMIBE 10 MG PO TABS: 10.0000 mg | ORAL_TABLET | Freq: Every day | ORAL | 3 refills | Status: DC

## 2017-10-23 NOTE — Telephone Encounter (Signed)
Called patient with changes. Patient verbalized understanding.

## 2017-10-23 NOTE — Telephone Encounter (Signed)
Called patient about lab results. Per Tana Coast RPH, LDL is improved on Crestor 10mg  daily, however still not at goal <70 with CAD. TG also slightly improved. Encourage to continue to watch carbs, sugar, and alcohol. If patient willing increase Crestor to 20mg  daily. Repeat panel in 3 months. Patient stated she stopped taking her Crestor 10 mg, that her legs started hurting again. Patient stated she was taking Crestor 5 mg and she did not have any problems with it. Informed patient that Dr. Johnsie Cancel could address this at there office visit this Friday. Patient verbalized understanding. Will forward to [place] so she is aware, and see if there is anything else we can do.

## 2017-10-23 NOTE — Telephone Encounter (Signed)
-----   Message from Leeroy Bock, [facility] sent at 10/23/2017 12:23 PM EST ----- Recommend continuing Crestor 5mg  daily as this seems to be patient's highest tolerated dose. Would also recommend starting Zetia 10mg  daily to bring LDL to goal < 70.

## 2017-10-23 NOTE — Telephone Encounter (Signed)
New message ° ° °Patient calling for lab results. Please call °

## 2017-10-26 NOTE — Progress Notes (Signed)
Patient ID: Colleen Parks, female   DOB: 03/06/1935, 83 y.o.   MRN: [id number]   Colleen Parks is seen today for F/U of elevated lipids, HTN.  CRF's include elevated lipid and HTN which is finally Rx. She also had a HbA1c of 7.3 and has begun Rx for type 2 DM. She is active in the garden and bowls    Glomerulonephritis in remission  Off steroids  Had right brain cyst that caused temporary RUE weakness That is now better   Had left CEA with Dr Doren Custard 11/2016   Intolerant to most statins on max tolerated crestor and zetia Does not want to give herself shots In regard to PSK 9 use   She is very recalcitrant about not taking cholesterol pills   ROS: Denies fever, malais, weight loss, blurry vision, decreased visual acuity, cough, sputum, SOB, hemoptysis, pleuritic pain, palpitaitons, heartburn, abdominal pain, melena, lower extremity edema, claudication, or rash.  All other systems reviewed and negative  General: BP (!) 158/66   Pulse 75   Ht 5\' 6"  (1.676 m)   Wt 146 lb 4 oz (66.3 kg)   BMI 23.61 kg/m  Affect appropriate Healthy:  appears stated age 67: post left CEA  Neck supple with no adenopathy JVP normal no bruits no thyromegaly Lungs clear with no wheezing and good diaphragmatic motion Heart:  S1/S2 no murmur, no rub, gallop or click PMI normal Abdomen: benighn, BS positve, no tenderness, no AAA no bruit.  No HSM or HJR Distal pulses intact with no bruits No edema Neuro non-focal Skin warm and dry No muscular weakness     Current Outpatient Medications  Medication Sig Dispense Refill  . acetaminophen (TYLENOL) 325 MG tablet Take 325 mg by mouth every 6 (six) hours as needed for mild pain.    Marland Kitchen amLODipine (NORVASC) 5 MG tablet TAKE 1 TABLET BY MOUTH DAILY. 30 tablet 8  . cyanocobalamin 1000 MCG tablet Take 1,000 mcg by mouth daily.    Marland Kitchen ezetimibe (ZETIA) 10 MG tablet Take 1 tablet (10 mg total) by mouth daily. 90 tablet 3  . fenofibrate (TRICOR) 48 MG tablet Take 1  tablet (48 mg total) by mouth daily. 90 tablet 1  . FLUoxetine (PROZAC) 10 MG capsule Take 10 mg by mouth daily.    . Multiple Vitamin (MULTIVITAMIN WITH MINERALS) TABS tablet Take 1 tablet by mouth daily. For over 58    . mupirocin ointment (BACTROBAN) 2 %     . oxybutynin (DITROPAN-XL) 5 MG 24 hr tablet Take 5 mg by mouth daily.    . ranitidine (ZANTAC) 150 MG tablet Take 150 mg by mouth daily as needed for heartburn. For heartburn    . rosuvastatin (CRESTOR) 10 MG tablet Take 0.5 tablets (5 mg total) by mouth daily. 90 tablet 3   No current facility-administered medications for this visit.     Allergies  Codeine and Metformin and related  Electrocardiogram: 11/14  SR rate 74 normal 08/2014  no change NSR normal ECG  09/14/15 SR rate 65 normal no change  10/16/16  SR rate 74  Normal 10/30/17  SR rate 75 normal   Assessment and Plan   Carotid:  Left CEA [place] 11/13/16 right 40-59% f/u duplex 06/2018    HTN:  Well controlled.  Continue current medications and low sodium Dash type diet.    Chol:  On max tolerated crestor and zetia added LDL improved   Glomerulonephritis  Improved labs with primary   Lab Results  Component  Value Date   CREATININE 1.75 (H) 11/14/2016   BUN 29 (H) 11/14/2016   NA 140 11/14/2016   K 4.5 11/14/2016   CL 106 11/14/2016   CO2 24 11/14/2016    Arachnoid Cyst  Recent CT with stable size and no recurrent UE weakness  GERD  Low carb diet and zantac    F/U 12 months    [name]

## 2017-10-30 ENCOUNTER — Other Ambulatory Visit: Payer: Self-pay

## 2017-10-30 ENCOUNTER — Encounter: Payer: Self-pay | Admitting: Cardiovascular Disease

## 2017-10-30 ENCOUNTER — Ambulatory Visit: Payer: PPO | Admitting: Cardiovascular Disease

## 2017-10-30 VITALS — BP 158/66 | HR 75 | Ht 66.0 in | Wt 146.2 lb

## 2017-10-30 DIAGNOSIS — I6522 Occlusion and stenosis of left carotid artery: Secondary | ICD-10-CM

## 2017-10-30 DIAGNOSIS — E782 Mixed hyperlipidemia: Secondary | ICD-10-CM

## 2017-10-30 DIAGNOSIS — I10 Essential (primary) hypertension: Secondary | ICD-10-CM | POA: Diagnosis not present

## 2017-10-30 DIAGNOSIS — E785 Hyperlipidemia, unspecified: Secondary | ICD-10-CM

## 2017-10-30 DIAGNOSIS — Z79899 Other long term (current) drug therapy: Secondary | ICD-10-CM

## 2017-10-30 DIAGNOSIS — E7800 Pure hypercholesterolemia, unspecified: Secondary | ICD-10-CM

## 2017-10-30 NOTE — Patient Instructions (Addendum)
Medication Instructions:  Your physician recommends that you continue on your current medications as directed. Please refer to the Current Medication list given to you today.  Labwork: Your physician recommends that you return for lab work in: 3 months for fasting lipid and liver panel.  Testing/Procedures: Your physician has requested that you have a carotid duplex in October. This test is an ultrasound of the carotid arteries in your neck. It looks at blood flow through these arteries that supply the brain with blood. Allow one hour for this exam. There are no restrictions or special instructions.  Follow-Up: Your physician wants you to follow-up in: 12 months with Dr. Johnsie Cancel. You will receive a reminder letter in the mail two months in advance. If you don't receive a letter, please call our office to schedule the follow-up appointment.   If you need a refill on your cardiac medications before your next appointment, please call your pharmacy.

## 2017-11-04 DIAGNOSIS — N184 Chronic kidney disease, stage 4 (severe): Secondary | ICD-10-CM | POA: Diagnosis not present

## 2017-11-10 DIAGNOSIS — I10 Essential (primary) hypertension: Secondary | ICD-10-CM | POA: Diagnosis not present

## 2017-11-10 DIAGNOSIS — F419 Anxiety disorder, unspecified: Secondary | ICD-10-CM | POA: Diagnosis not present

## 2017-11-10 DIAGNOSIS — N184 Chronic kidney disease, stage 4 (severe): Secondary | ICD-10-CM | POA: Diagnosis not present

## 2017-11-10 DIAGNOSIS — E782 Mixed hyperlipidemia: Secondary | ICD-10-CM | POA: Diagnosis not present

## 2017-11-11 ENCOUNTER — Other Ambulatory Visit: Payer: Self-pay | Admitting: Cardiovascular Disease

## 2017-11-24 ENCOUNTER — Telehealth: Payer: Self-pay | Admitting: Cardiovascular Disease

## 2017-11-24 NOTE — Telephone Encounter (Signed)
New Message    Patient is calling in reference to the last prescription that was given to her. But she did not want to tell me the name of the medication or whether or not there was an issue with the medication. Please call to discuss.

## 2017-11-24 NOTE — Telephone Encounter (Signed)
Patient is refusing to take zetia. Patient stated her legs hurt and she is not taking it no more. Patient wanted to let Dr. Johnsie Cancel know. Will forward to Dr. Johnsie Cancel so he is aware. Patient is still taking Crestor 5 mg.

## 2018-01-25 DIAGNOSIS — Z79899 Other long term (current) drug therapy: Secondary | ICD-10-CM | POA: Diagnosis not present

## 2018-01-25 DIAGNOSIS — E785 Hyperlipidemia, unspecified: Secondary | ICD-10-CM | POA: Diagnosis not present

## 2018-01-25 NOTE — Addendum Note (Signed)
Addended by: Aris Georgia, Baelyn Doring L on: 01/25/2018 08:30 AM   Modules accepted: Orders

## 2018-01-26 LAB — HEPATIC FUNCTION PANEL
ALBUMIN: 4.4 g/dL (ref 3.5–4.7)
ALT: 13 IU/L (ref 0–32)
AST: 17 IU/L (ref 0–40)
Alkaline Phosphatase: 67 IU/L (ref 39–117)
BILIRUBIN TOTAL: 0.4 mg/dL (ref 0.0–1.2)
BILIRUBIN, DIRECT: 0.08 mg/dL (ref 0.00–0.40)
TOTAL PROTEIN: 6.9 g/dL (ref 6.0–8.5)

## 2018-01-26 LAB — LIPID PANEL
CHOL/HDL RATIO: 6 ratio — AB (ref 0.0–4.4)
Cholesterol, Total: 293 mg/dL — ABNORMAL HIGH (ref 100–199)
HDL: 49 mg/dL (ref >39)
LDL Calculated: 200 mg/dL — ABNORMAL HIGH (ref 0–99)
Triglycerides: 221 mg/dL — ABNORMAL HIGH (ref 0–149)
VLDL Cholesterol Cal: 44 mg/dL — ABNORMAL HIGH (ref 5–40)

## 2018-02-15 ENCOUNTER — Telehealth: Payer: Self-pay | Admitting: Cardiovascular Disease

## 2018-02-15 NOTE — Telephone Encounter (Signed)
Left message for patient to call back  

## 2018-02-15 NOTE — Telephone Encounter (Signed)
New Message ° ° °Pt returning call for nurse °

## 2018-02-19 NOTE — Telephone Encounter (Signed)
New Message    Patient is returning call to nurse. Please call. She is unclear as to why she is being contacted.

## 2018-02-19 NOTE — Telephone Encounter (Signed)
Notes recorded by Michaelyn Barter, RN on 02/17/2018 at 8:26 AM EDT Patient aware of lab results. Per Dr. Johnsie Cancel, LDL is horrible and she has vascular disease Intolerant to statin F/U lipid clinic and try to get her on PSK9.   Spoke with patient. Appt made w/ lipid clinic for 7/2. Patient verbalized understanding and agreeable to plan.

## 2018-03-09 DIAGNOSIS — E782 Mixed hyperlipidemia: Secondary | ICD-10-CM | POA: Diagnosis not present

## 2018-03-09 DIAGNOSIS — E039 Hypothyroidism, unspecified: Secondary | ICD-10-CM | POA: Diagnosis not present

## 2018-03-09 DIAGNOSIS — E119 Type 2 diabetes mellitus without complications: Secondary | ICD-10-CM | POA: Diagnosis not present

## 2018-03-09 DIAGNOSIS — N183 Chronic kidney disease, stage 3 (moderate): Secondary | ICD-10-CM | POA: Diagnosis not present

## 2018-03-15 DIAGNOSIS — E782 Mixed hyperlipidemia: Secondary | ICD-10-CM | POA: Diagnosis not present

## 2018-03-15 DIAGNOSIS — E1165 Type 2 diabetes mellitus with hyperglycemia: Secondary | ICD-10-CM | POA: Diagnosis not present

## 2018-03-15 DIAGNOSIS — E119 Type 2 diabetes mellitus without complications: Secondary | ICD-10-CM | POA: Diagnosis not present

## 2018-03-15 DIAGNOSIS — I10 Essential (primary) hypertension: Secondary | ICD-10-CM | POA: Diagnosis not present

## 2018-03-16 ENCOUNTER — Ambulatory Visit: Payer: PPO | Admitting: Pharmacist

## 2018-03-16 DIAGNOSIS — E782 Mixed hyperlipidemia: Secondary | ICD-10-CM | POA: Diagnosis not present

## 2018-03-16 NOTE — Patient Instructions (Signed)
It was nice to see you today  I will start the paperwork process for Repatha injections for your cholesterol and let you know once I have heard from your insurance about the cost

## 2018-03-16 NOTE — Progress Notes (Signed)
Patient ID: [name]                 DOB: 1935-04-17                    MRN: [id number]     HPI: Colleen Parks is a 83 y.o. female patient referred to lipid clinic by Dr. Johnsie Cancel. PMH is significant for HTN, renal artery stenosis, subclavian arterial stenosis, left CEA, diabetes, HLD, and breast cancer. Pt reported intolerances to multiple statins. LDL followed by PCP. PCSK9 therapy has been discussed previously, but patient is terrified to give herself injections and also worried about the cost. LDL has previously been as high as 337, pt reports everyone in her family has high cholesterol.  Pt was seen in lipid clinic last fall and was started on Crestor 5mg  daily. This improved her LDL from > 200 to 102. She was unable to tolerate dose increase to Crestor 10mg  daily due to myalgias. She was advised to continue Crestor 5mg  daily and start Zetia 10mg  daily, however most recent lipid panel shows LDL has increased back to 200.  Pt reports that she had to stop taking Zetia due to myalgias within 1 week of therapy initiation. She did not tolerate Crestor 5mg  when she resumed this most recently either. She experienced leg pain which has improved with statin discontinuation, however has not disappeared completely. She is tolerating her fenofibrate well. She previously was opposed to injectable cholesterol medication, however is now more agreeable as she does not want to rechallenge with any statin therapy. Her younger sister has been started on PCSK9i therapy and is doing well.  Current Medications: fenofibrate 48mg  daily Intolerances: rosuvastatin 20mg  every 3 days, rosuvastatin 5mg  and 10mg  daily - muscle/leg pain, lovastatin 20mg  - muscle/leg pain, pravastatin 80mg  - muscle/leg pain, ezetimibe 10mg  daiy - leg pain Risk Factors: diabetes, carotid endarterectomy, CAD, family history, HTN, age LDL goal: < 70mg /dL  Diet: Breakfast - Eats Cheerios with bananas and a cup of coffee for  breakfast. Lunch - Chicken and pears with milk. Dinner - Butter beans, Hamburger pinto bean and potato casserole. Has garden, eats vegetables that she cans herself.  Drinks whole milk, one diet soda/day, and unsweetened tea. Typically drinks 1 cup of coffee daily, unless she goes out to eat and she will have another.  Exercise:Takes line dancing classes, 3x a week. Bowls twice a week and has exercise classes once a week.Went to the senior games in [place] and came in 2nd in [place]. Also plays horseshoes and shoots some basketball at the games.  Family History:Father - CAD (died at 68), Brother - stent (died at 61), Sister - Diabetes  Social History:Former smoker, quit in 1960. Smoked 1PPD for 3-4 years. Denies alcohol or illicit drug use.   Labs:  07/21/17: TC 216, TG 240, HDL 57, LDL 111, LDL-D 132 (Crestor 5mg  daily) 10/20/17: TC 195, TG 177, HDL 58, LDL 102 (Crestor 10mg  daily - had myalgias) 01/25/18: TC 293, TG 221, HDL 49, LDL 200 (no statin therapy, still taking fenofibrate)   Past Medical History:  Diagnosis Date  . 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 Anxiety   . Cancer ([place])     left breast  . Diabetes mellitus    no longer on medication  . GERD (gastroesophageal reflux disease)   . Glomerulonephritis   . HTN (hypertension)   . Hypercholesterolemia   . RAS (renal artery stenosis) ([place])   . Sleep apnea  no cpap use  . Subclavian arterial stenosis ([facility])     Current Outpatient Medications on File Prior to Visit  Medication Sig Dispense Refill  . acetaminophen (TYLENOL) 325 MG tablet Take 325 mg by mouth every 6 (six) hours as needed for mild pain.    Marland Kitchen amLODipine (NORVASC) 5 MG tablet TAKE 1 TABLET BY MOUTH DAILY. 30 tablet 8  . cyanocobalamin 1000 MCG tablet Take 1,000 mcg by mouth daily.    Marland Kitchen ezetimibe (ZETIA) 10 MG tablet Take 1 tablet (10 mg total) by mouth daily. (Patient not taking: Reported on 11/24/2017) 90 tablet 3  . fenofibrate (TRICOR) 48 MG tablet  Take 1 tablet (48 mg total) by mouth daily. 90 tablet 3  . FLUoxetine (PROZAC) 10 MG capsule Take 10 mg by mouth daily.    . Multiple Vitamin (MULTIVITAMIN WITH MINERALS) TABS tablet Take 1 tablet by mouth daily. For over 37    . mupirocin ointment (BACTROBAN) 2 %     . oxybutynin (DITROPAN-XL) 5 MG 24 hr tablet Take 5 mg by mouth daily.    . ranitidine (ZANTAC) 150 MG tablet Take 150 mg by mouth daily as needed for heartburn. For heartburn    . rosuvastatin (CRESTOR) 10 MG tablet Take 0.5 tablets (5 mg total) by mouth daily. 90 tablet 3   No current facility-administered medications on file prior to visit.     Allergies  Allergen Reactions  . Codeine Nausea And Vomiting  . Metformin And Related Other (See Comments)    Had kidney problems and nephrologist told her never to take it again    Assessment/Plan:  1. Hyperlipidemia - Baseline LDL back up to 200mg /dL, far above goal < 70mg /dL due to ASCVD history. She is intolerant to rosuvastatin 5mg  daily, pravastatin 80mg  daily, lovastatin 20mg  daily, and ezetimibe 10mg  daily (myalgias). Pt is more agreeable to injectable medication at this time. Discussed expected benefits, side effects, and injection technique of PCSK9i therapy. Will submit prior authorization for Repatha and follow up with pt once copay information is available. She has filled out Safety Net Application in case cost is prohibitive.   Megan E. Supple, PharmD, BCACP, [facility] [address] [place], [place] [address] Phone: [phone]; Fax: [phone] 03/16/2018 10:01 AM

## 2018-03-17 ENCOUNTER — Telehealth: Payer: Self-pay | Admitting: Pharmacist

## 2018-03-17 MED ORDER — EVOLOCUMAB 140 MG/ML ~~LOC~~ SOAJ: 1.0000 "pen " | SUBCUTANEOUS | 11 refills | Status: DC

## 2018-03-17 NOTE — Telephone Encounter (Signed)
Copay cost prohibitive at $140 per month. Will submit Safety Net Application to see if pt qualifies for patient assistance.

## 2018-03-17 NOTE — Telephone Encounter (Signed)
Pt approved for Repatha. Rx sent to pharmacy to assess copay.

## 2018-04-06 DIAGNOSIS — N184 Chronic kidney disease, stage 4 (severe): Secondary | ICD-10-CM | POA: Diagnosis not present

## 2018-04-06 DIAGNOSIS — R801 Persistent proteinuria, unspecified: Secondary | ICD-10-CM | POA: Diagnosis not present

## 2018-04-06 DIAGNOSIS — I129 Hypertensive chronic kidney disease with stage 1 through stage 4 chronic kidney disease, or unspecified chronic kidney disease: Secondary | ICD-10-CM | POA: Diagnosis not present

## 2018-04-06 DIAGNOSIS — I776 Arteritis, unspecified: Secondary | ICD-10-CM | POA: Diagnosis not present

## 2018-04-28 ENCOUNTER — Telehealth: Payer: Self-pay | Admitting: Pharmacist

## 2018-04-28 DIAGNOSIS — E782 Mixed hyperlipidemia: Secondary | ICD-10-CM

## 2018-04-28 NOTE — Telephone Encounter (Signed)
Left message for patient to see if she started PCSK9i therapy. Needs follow-up lipid panel if she calls back.

## 2018-04-29 NOTE — Addendum Note (Signed)
Addended by: Erskine Emery on: 04/29/2018 08:24 AM   Modules accepted: Orders

## 2018-04-29 NOTE — Telephone Encounter (Signed)
Pt called back to report that she has started taking Repatha and will do her 3rd injection today. Scheduled for labs after 4th dose.

## 2018-05-18 ENCOUNTER — Other Ambulatory Visit: Payer: PPO | Admitting: *Deleted

## 2018-05-18 DIAGNOSIS — E782 Mixed hyperlipidemia: Secondary | ICD-10-CM

## 2018-05-18 LAB — HEPATIC FUNCTION PANEL
ALBUMIN: 4.5 g/dL (ref 3.5–4.7)
ALK PHOS: 63 IU/L (ref 39–117)
ALT: 8 IU/L (ref 0–32)
AST: 14 IU/L (ref 0–40)
BILIRUBIN TOTAL: 0.3 mg/dL (ref 0.0–1.2)
BILIRUBIN, DIRECT: 0.1 mg/dL (ref 0.00–0.40)
TOTAL PROTEIN: 6.5 g/dL (ref 6.0–8.5)

## 2018-05-18 LAB — LIPID PANEL
Chol/HDL Ratio: 3 ratio (ref 0.0–4.4)
Cholesterol, Total: 154 mg/dL (ref 100–199)
HDL: 51 mg/dL (ref >39)
LDL Calculated: 71 mg/dL (ref 0–99)
Triglycerides: 161 mg/dL — ABNORMAL HIGH (ref 0–149)
VLDL CHOLESTEROL CAL: 32 mg/dL (ref 5–40)

## 2018-05-26 ENCOUNTER — Telehealth: Payer: Self-pay

## 2018-05-26 DIAGNOSIS — E782 Mixed hyperlipidemia: Secondary | ICD-10-CM

## 2018-05-26 DIAGNOSIS — Z79899 Other long term (current) drug therapy: Secondary | ICD-10-CM

## 2018-05-26 NOTE — Telephone Encounter (Signed)
Patient aware of lab results. Per Tana Coast RPH, LDL borderline at goal with 64.5% reduction since starting Repatha. Would recommend continue Repatha. TG also slightly improved. Would recommend avoiding alcohol, decreasing carbs and sugars in diet. Agree with repeat panel in 6 months. Patient verbalized understanding. Patient will come back in March to have lab work done.

## 2018-05-26 NOTE — Telephone Encounter (Signed)
-----   Message from Erskine Emery, [facility] sent at 05/19/2018  9:27 AM EDT ----- LDL borderline at goal with 64.5% reduction since starting Repatha. Would recommend continue Repatha. TG also slightly improved. Would recommend avoiding alcohol, decreasing carbs and sugars in diet. Agree with repeat panel in 6 months.

## 2018-06-15 ENCOUNTER — Ambulatory Visit (HOSPITAL_COMMUNITY)
Admission: RE | Admit: 2018-06-15 | Discharge: 2018-06-15 | Disposition: A | Discharge status: Home / Self Care | Payer: PPO | Source: Ambulatory Visit | Attending: Cardiovascular Disease | Admitting: Cardiovascular Disease

## 2018-06-15 DIAGNOSIS — I6522 Occlusion and stenosis of left carotid artery: Secondary | ICD-10-CM

## 2018-06-21 ENCOUNTER — Telehealth: Payer: Self-pay | Admitting: Cardiovascular Disease

## 2018-06-21 DIAGNOSIS — I6523 Occlusion and stenosis of bilateral carotid arteries: Secondary | ICD-10-CM

## 2018-06-21 NOTE — Telephone Encounter (Signed)
Left message for patient to call back  

## 2018-06-21 NOTE — Telephone Encounter (Signed)
New message:      Pt is returning a call for results

## 2018-06-21 NOTE — Telephone Encounter (Signed)
Patient aware of carotid results. Will place order for carotid to have done next year.

## 2018-06-21 NOTE — Telephone Encounter (Signed)
Patient returned call, please call her back.

## 2018-07-13 DIAGNOSIS — Z23 Encounter for immunization: Secondary | ICD-10-CM | POA: Diagnosis not present

## 2018-07-13 DIAGNOSIS — R195 Other fecal abnormalities: Secondary | ICD-10-CM | POA: Diagnosis not present

## 2018-07-13 DIAGNOSIS — N183 Chronic kidney disease, stage 3 (moderate): Secondary | ICD-10-CM | POA: Diagnosis not present

## 2018-07-13 DIAGNOSIS — I10 Essential (primary) hypertension: Secondary | ICD-10-CM | POA: Diagnosis not present

## 2018-07-13 DIAGNOSIS — E782 Mixed hyperlipidemia: Secondary | ICD-10-CM | POA: Diagnosis not present

## 2018-07-16 DIAGNOSIS — E1165 Type 2 diabetes mellitus with hyperglycemia: Secondary | ICD-10-CM | POA: Diagnosis not present

## 2018-07-16 DIAGNOSIS — N183 Chronic kidney disease, stage 3 (moderate): Secondary | ICD-10-CM | POA: Diagnosis not present

## 2018-07-16 DIAGNOSIS — E782 Mixed hyperlipidemia: Secondary | ICD-10-CM | POA: Diagnosis not present

## 2018-07-16 DIAGNOSIS — I10 Essential (primary) hypertension: Secondary | ICD-10-CM | POA: Diagnosis not present

## 2018-07-22 ENCOUNTER — Telehealth: Payer: Self-pay | Admitting: Pharmacist

## 2018-07-22 NOTE — Telephone Encounter (Signed)
Pt called clinic regarding SNF application for 1444. She has filled out her portion and will mail the forms to clinic so that we can complete patient assistance for Repatha for next year.

## 2018-07-27 DIAGNOSIS — C50912 Malignant neoplasm of unspecified site of left female breast: Secondary | ICD-10-CM | POA: Diagnosis not present

## 2018-07-27 DIAGNOSIS — C50012 Malignant neoplasm of nipple and areola, left female breast: Secondary | ICD-10-CM | POA: Diagnosis not present

## 2018-08-11 DIAGNOSIS — H00022 Hordeolum internum right lower eyelid: Secondary | ICD-10-CM | POA: Diagnosis not present

## 2018-09-02 ENCOUNTER — Other Ambulatory Visit (HOSPITAL_COMMUNITY): Payer: Self-pay | Admitting: Family Medicine

## 2018-09-02 DIAGNOSIS — Z1231 Encounter for screening mammogram for malignant neoplasm of breast: Secondary | ICD-10-CM

## 2018-09-24 ENCOUNTER — Ambulatory Visit (HOSPITAL_COMMUNITY)
Admission: RE | Admit: 2018-09-24 | Discharge: 2018-09-24 | Disposition: A | Discharge status: Home / Self Care | Payer: PPO | Source: Ambulatory Visit | Attending: Family Medicine | Admitting: Family Medicine

## 2018-09-24 ENCOUNTER — Encounter (HOSPITAL_COMMUNITY): Payer: Self-pay

## 2018-09-24 DIAGNOSIS — Z1231 Encounter for screening mammogram for malignant neoplasm of breast: Secondary | ICD-10-CM | POA: Insufficient documentation

## 2018-10-17 ENCOUNTER — Other Ambulatory Visit: Payer: Self-pay

## 2018-10-17 ENCOUNTER — Encounter (HOSPITAL_COMMUNITY): Payer: Self-pay

## 2018-10-17 ENCOUNTER — Emergency Department (HOSPITAL_COMMUNITY)
Admission: EM | Admit: 2018-10-17 | Discharge: 2018-10-17 | Disposition: A | Discharge status: Home / Self Care | Payer: PPO | Attending: Emergency Medicine | Admitting: Emergency Medicine

## 2018-10-17 DIAGNOSIS — Z853 Personal history of malignant neoplasm of breast: Secondary | ICD-10-CM | POA: Diagnosis not present

## 2018-10-17 DIAGNOSIS — I10 Essential (primary) hypertension: Secondary | ICD-10-CM | POA: Diagnosis not present

## 2018-10-17 DIAGNOSIS — Z87891 Personal history of nicotine dependence: Secondary | ICD-10-CM | POA: Diagnosis not present

## 2018-10-17 DIAGNOSIS — E119 Type 2 diabetes mellitus without complications: Secondary | ICD-10-CM | POA: Diagnosis not present

## 2018-10-17 DIAGNOSIS — B029 Zoster without complications: Secondary | ICD-10-CM | POA: Insufficient documentation

## 2018-10-17 DIAGNOSIS — Z79899 Other long term (current) drug therapy: Secondary | ICD-10-CM | POA: Insufficient documentation

## 2018-10-17 DIAGNOSIS — R21 Rash and other nonspecific skin eruption: Secondary | ICD-10-CM | POA: Diagnosis not present

## 2018-10-17 LAB — BASIC METABOLIC PANEL
Anion gap: 10 (ref 5–15)
BUN: 46 mg/dL — AB (ref 8–23)
CO2: 24 mmol/L (ref 22–32)
CREATININE: 2.39 mg/dL — AB (ref 0.44–1.00)
Calcium: 9.6 mg/dL (ref 8.9–10.3)
Chloride: 106 mmol/L (ref 98–111)
GFR calc Af Amer: 21 mL/min — ABNORMAL LOW (ref >60)
GFR, EST NON AFRICAN AMERICAN: 18 mL/min — AB (ref >60)
Glucose, Bld: 136 mg/dL — ABNORMAL HIGH (ref 70–99)
POTASSIUM: 4.4 mmol/L (ref 3.5–5.1)
SODIUM: 140 mmol/L (ref 135–145)

## 2018-10-17 MED ORDER — VALACYCLOVIR HCL 500 MG PO TABS
1000.0000 mg | ORAL_TABLET | ORAL | Status: AC
  Administered 2018-10-17: 1000 mg via ORAL
  Filled 2018-10-17: qty 2

## 2018-10-17 MED ORDER — VALACYCLOVIR HCL 1 G PO TABS: 1000.0000 mg | ORAL_TABLET | Freq: Three times a day (TID) | ORAL | 0 refills | Status: DC

## 2018-10-17 NOTE — ED Provider Notes (Signed)
[facility] EMERGENCY DEPARTMENT Provider Note   CSN: [id number] Arrival date & time: 10/17/18  7619     History   Chief Complaint Chief Complaint  Patient presents with  . Rash    HPI Colleen Parks is a 84 y.o. female.  HPI  The patient is a very pleasant 84 year old female, she has a known history of glomerulonephritis status post longtime steroid therapy but not on steroids anymore, history of left-sided breast cancer status post mastectomy, renal artery stenosis.  She presents with a rash which started within the last week on the right chest wall, it is located on the upper chest and is started to spread into the right axilla and onto the right upper back but she is also having some symptoms in her right arm.  She denies fevers or chills but has had some recent nausea and vomiting intermittently over the last month.  She is no longer on chemotherapy, no longer takes any prednisone, she has not had an evaluation for this rash prior to this visit.  Describes the rash as blistering  Past Medical History:  Diagnosis Date  . 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 Anxiety   . Cancer [facility])     left breast mastectomy  . Diabetes mellitus    no longer on medication  . GERD (gastroesophageal reflux disease)   . Glomerulonephritis   . HTN (hypertension)   . Hypercholesterolemia   . RAS (renal artery stenosis) ([place])   . Sleep apnea    no cpap use  . Subclavian arterial stenosis [facility])     Patient Active Problem List   Diagnosis Date Noted  . Asymptomatic stenosis of left carotid artery 11/13/2016  . Arachnoid cyst 08/18/2014  . Glomerulonephritis 08/01/2013  . Hypercholesterolemia   . RAS (renal artery stenosis) ([place])   . Subclavian arterial stenosis ([place])   . Cancer ([place])   . DM (diabetes mellitus) ([place]) 11/19/2011  . MIXED HYPERLIPIDEMIA 08/19/2009  . ESSENTIAL HYPERTENSION, BENIGN 08/19/2009  . Bilateral carotid bruits 08/19/2009    Past Surgical History:    Procedure Laterality Date  . BIOPSY THYROID    . CATARACT EXTRACTION W/PHACO  12/18/2011   Procedure: CATARACT EXTRACTION PHACO AND INTRAOCULAR LENS PLACEMENT (IOC);  Surgeon: Tonny Branch, MD;  Location: [facility] ORS;  Service: Ophthalmology;  Laterality: Right;  CDE:17.77  . CATARACT EXTRACTION W/PHACO  01/26/2012   Procedure: CATARACT EXTRACTION PHACO AND INTRAOCULAR LENS PLACEMENT (IOC);  Surgeon: Tonny Branch, MD;  Location: [facility] ORS;  Service: Ophthalmology;  Laterality: Left;  CDE 15.23  . COLONOSCOPY N/A 05/10/2014   Procedure: COLONOSCOPY;  Surgeon: Rogene Houston, MD;  Location: [facility] ENDO SUITE;  Service: Endoscopy;  Laterality: N/A;  830  . ENDARTERECTOMY Left 11/13/2016   Procedure: LEFT CAROTID ARTERY ENDARTERECTOMY;  Surgeon: Angelia Mould, MD;  Location: [facility];  Service: Vascular;  Laterality: Left;  . left breast mastectomy with chemo  1995   . MASTECTOMY Left   . PATCH ANGIOPLASTY Left 11/13/2016   Procedure: PATCH ANGIOPLASTY OF THE LEFT CAROTID ARTERY USING HEMASHEILD PACTH;  Surgeon: Angelia Mould, MD;  Location: [facility];  Service: Vascular;  Laterality: Left;     OB History   No obstetric history on file.      Home Medications    Prior to Admission medications   Medication Sig Start Date End Date Taking? Authorizing Provider  acetaminophen (TYLENOL) 325 MG tablet Take 325 mg by mouth every 6 (six) hours as needed for mild  pain.    [provider]  amLODipine (NORVASC) 5 MG tablet TAKE 1 TABLET BY MOUTH DAILY. 05/21/16   Josue Hector, MD  cyanocobalamin 1000 MCG tablet Take 1,000 mcg by mouth daily.    [provider]  Evolocumab (REPATHA SURECLICK) 621 MG/ML SOAJ Inject 1 pen into the skin every 14 (fourteen) days. 03/17/18   Josue Hector, MD  fenofibrate (TRICOR) 48 MG tablet Take 1 tablet (48 mg total) by mouth daily. 11/11/17   Josue Hector, MD  FLUoxetine (PROZAC) 10 MG capsule Take 10 mg by mouth daily.    [provider]  Multiple  Vitamin (MULTIVITAMIN WITH MINERALS) TABS tablet Take 1 tablet by mouth daily. For over 50    [provider]  oxybutynin (DITROPAN-XL) 5 MG 24 hr tablet Take 5 mg by mouth daily. 04/13/17   [provider]  ranitidine (ZANTAC) 150 MG tablet Take 150 mg by mouth daily as needed for heartburn. For heartburn    [provider]  valACYclovir (VALTREX) 1000 MG tablet Take 1 tablet (1,000 mg total) by mouth 3 (three) times daily. 10/17/18   Noemi Chapel, MD    Family History Family History  Problem Relation Age of Onset  . Coronary artery disease Other        positive for premature- on fathers side  . Anesthesia problems Neg Hx   . Hypotension Neg Hx   . Malignant hyperthermia Neg Hx   . Pseudochol deficiency Neg Hx     Social History Social History   Tobacco Use  . Smoking status: Former Smoker    Packs/day: 0.50    Years: 4.00    Pack years: 2.00    Types: Cigarettes    Last attempt to quit: 12/09/1958    Years since quitting: 59.8  . Smokeless tobacco: Never Used  . Tobacco comment: non-smoker  Substance Use Topics  . Alcohol use: No    Alcohol/week: 0.0 standard drinks  . Drug use: No     Allergies   Codeine; Ezetimibe; Metformin and related; and Statins   Review of Systems Review of Systems  Constitutional: Negative for fever.  Respiratory: Negative for cough and shortness of breath.   Skin: Positive for rash.     Physical Exam Updated Vital Signs BP (!) 154/96 (BP Location: Right Arm)   Pulse 72   Temp 97.9 F (36.6 C) (Oral)   Resp 18   Ht 1.676 m (5\' 6" )   Wt 63.5 kg   SpO2 98%   BMI 22.60 kg/m   Physical Exam Vitals signs and nursing note reviewed.  Constitutional:      General: She is not in acute distress.    Appearance: She is well-developed.  HENT:     Head: Normocephalic and atraumatic.     Mouth/Throat:     Pharynx: No oropharyngeal exudate.  Eyes:     General: No scleral icterus.       Right eye: No discharge.         Left eye: No discharge.     Conjunctiva/sclera: Conjunctivae normal.     Pupils: Pupils are equal, round, and reactive to light.  Neck:     Musculoskeletal: Normal range of motion and neck supple.     Thyroid: No thyromegaly.     Vascular: No JVD.  Cardiovascular:     Rate and Rhythm: Normal rate and regular rhythm.     Heart sounds: Normal heart sounds. No murmur. No friction rub.  No gallop.   Pulmonary:     Effort: Pulmonary effort is normal. No respiratory distress.     Breath sounds: Normal breath sounds. No wheezing or rales.  Abdominal:     General: Bowel sounds are normal. There is no distension.     Palpations: Abdomen is soft. There is no mass.     Tenderness: There is no abdominal tenderness.  Musculoskeletal: Normal range of motion.        General: No tenderness.  Lymphadenopathy:     Cervical: No cervical adenopathy.  Skin:    General: Skin is warm and dry.     Findings: Rash ( vesicular rash dermatomal R chest T3-4 distribution) present. No erythema.  Neurological:     Mental Status: She is alert.     Coordination: Coordination normal.  Psychiatric:        Behavior: Behavior normal.      ED Treatments / Results  Labs (all labs ordered are listed, but only abnormal results are displayed) Labs Reviewed  BASIC METABOLIC PANEL - Abnormal; Notable for the following components:      Result Value   Glucose, Bld 136 (*)    BUN 46 (*)    Creatinine, Ser 2.39 (*)    GFR calc non Af Amer 18 (*)    GFR calc Af Amer 21 (*)    All other components within normal limits    EKG None  Radiology No results found.  Procedures Procedures (including critical care time)  Medications Ordered in ED Medications  valACYclovir (VALTREX) tablet 1,000 mg (1,000 mg Oral Given 10/17/18 0803)     Initial Impression / Assessment and Plan / ED Course  I have reviewed the triage vital signs and the nursing notes.  Pertinent labs & imaging results that were available  during my care of the patient were reviewed by me and considered in my medical decision making (see chart for details).  Clinical Course as of Oct 17 856  Sun Oct 17, 9676  9381 Metabolic panel shows a creatinine of 2.4, will avoid anti-inflammatories   [BM]  0858 Her creatinine is slightly elevated from prior, she will need to drink plenty of fluids and have this followed up with her family doctor but given her history of glomerulonephritis it is not surprising to see this   [BM]    Clinical Course User Index [BM] Noemi Chapel, MD    The patient's rash extends in what appears to be the T3 or T4 dermatome stretching from her right side of the chest into the axilla and onto the right upper back.  I do not see any lesions on her arm or the face, they are blistering, there is no crusting but there is an erythematous base  The patient was informed that she needs to keep these covered that she is contagious and that she will need medication including valacyclovir.  We will check a basic metabolic panel to make sure that her renal function will tolerate anti-inflammatory medications.  The patient was verbally instructed to have her kidney function rechecked and to take plenty of fluids, she expressed her understanding  Final Clinical Impressions(s) / ED Diagnoses   Final diagnoses:  Herpes zoster without complication    ED Discharge Orders         Ordered    valACYclovir (VALTREX) 1000 MG tablet  3 times daily     10/17/18 0825           Noemi Chapel, MD 10/17/18  0859  

## 2018-10-17 NOTE — Discharge Instructions (Signed)
Your rash is from the Shingles - please read the attached instructions You will be contagious for the next couple of weeks until the rash crusts over. Take Valtrex 1000mg  3 times daily for a week

## 2018-10-17 NOTE — ED Triage Notes (Signed)
Pt c/o rash that started last Tuesday on the right side of chest, blisters on the chest arm and back.

## 2018-10-20 DIAGNOSIS — R109 Unspecified abdominal pain: Secondary | ICD-10-CM | POA: Diagnosis not present

## 2018-10-20 DIAGNOSIS — B029 Zoster without complications: Secondary | ICD-10-CM | POA: Diagnosis not present

## 2018-10-20 DIAGNOSIS — Z853 Personal history of malignant neoplasm of breast: Secondary | ICD-10-CM | POA: Diagnosis not present

## 2018-10-20 DIAGNOSIS — R1313 Dysphagia, pharyngeal phase: Secondary | ICD-10-CM | POA: Diagnosis not present

## 2018-11-03 NOTE — Progress Notes (Signed)
Patient ID: Colleen Parks, female   DOB: [birth year], 84 y.o.   MRN: [id number]   Colleen Parks is seen today for F/U of elevated lipids, HTN.  CRF's include elevated lipid and HTN which is finally Rx. She also had a HbA1c of 7.3 and has begun Rx for type 2 DM. She is active in the garden and bowls    Glomerulonephritis in remission  Off steroids  Had right brain cyst that caused temporary RUE weakness That is now better   Had left CEA with Dr Colleen Parks 11/2016 last duplex 06/2018 stable RICA 40-59%   Intolerant to statins started on Repatha and LDL has reduced from 200 to 71 on 05/18/18   Still complains of LE leg pain off statins Wonders about PVD   ROS: Denies fever, malais, weight loss, blurry vision, decreased visual acuity, cough, sputum, SOB, hemoptysis, pleuritic pain, palpitaitons, heartburn, abdominal pain, melena, lower extremity edema, claudication, or rash.  All other systems reviewed and negative  General: BP 138/66   Pulse 69   Ht 5\' 6"  (1.676 m)   Wt 65 kg   SpO2 98%   BMI 23.15 kg/m  Affect appropriate Healthy:  appears stated age 84: post left CEA  Neck supple with no adenopathy JVP normal left CEA  no thyromegaly Lungs clear with no wheezing and good diaphragmatic motion Heart:  S1/S2 no murmur, no rub, gallop or click PMI normal Abdomen: benighn, BS positve, no tenderness, no AAA no bruit.  No HSM or HJR Distal pulses intact with no bruits No edema Neuro non-focal Skin warm and dry No muscular weakness     Current Outpatient Medications  Medication Sig Dispense Refill  . acetaminophen (TYLENOL) 325 MG tablet Take 325 mg by mouth every 6 (six) hours as needed for mild pain.    Marland Kitchen amLODipine (NORVASC) 5 MG tablet TAKE 1 TABLET BY MOUTH DAILY. 30 tablet 8  . cyanocobalamin 1000 MCG tablet Take 1,000 mcg by mouth daily.    . Evolocumab (REPATHA SURECLICK) 735 MG/ML SOAJ Inject 1 pen into the skin every 14 (fourteen) days. 2 pen 11  . fenofibrate (TRICOR) 48 MG  tablet Take 1 tablet (48 mg total) by mouth daily. 90 tablet 3  . FLUoxetine (PROZAC) 10 MG capsule Take 10 mg by mouth daily.    . Multiple Vitamin (MULTIVITAMIN WITH MINERALS) TABS tablet Take 1 tablet by mouth daily. For over 72    . oxybutynin (DITROPAN-XL) 5 MG 24 hr tablet Take 5 mg by mouth daily.    . pantoprazole (PROTONIX) 40 MG tablet Take 1 tablet by mouth daily.    . ranitidine (ZANTAC) 150 MG tablet Take 150 mg by mouth daily as needed for heartburn. For heartburn     No current facility-administered medications for this visit.     Allergies  Codeine; Ezetimibe; Metformin and related; and Statins  Electrocardiogram:  11/11/18 SR rate 69 normal ECG  Assessment and Plan   Carotid:  Left CEA Colleen Parks 11/13/16 right 40-59% f/u duplex 06/2019  HTN:  Well controlled.  Continue current medications and low sodium Dash type diet.    Chol:  Intolerant to statins and zetia started on Repatha f/u lab work ok LDL 71  Glomerulonephritis  Improved labs with primary   Lab Results  Component Value Date   CREATININE 2.39 (H) 10/17/2018   BUN 46 (H) 10/17/2018   NA 140 10/17/2018   K 4.4 10/17/2018   CL 106 10/17/2018   CO2 24 10/17/2018  Arachnoid Cyst  Recent CT with stable size and no recurrent UE weakness  GERD  Low carb diet and zantac    DM:  Discussed low carb diet.  Target hemoglobin A1c is 6.5 or less.  Continue current medications.  Claudication :  ? Leg pain with ambulation check ABI's and LE arterial duplex   Carotids/LE Arterial Duplex   F/U 12 months    Colleen Parks

## 2018-11-05 ENCOUNTER — Ambulatory Visit (INDEPENDENT_AMBULATORY_CARE_PROVIDER_SITE_OTHER): Payer: PPO | Admitting: Internal Medicine

## 2018-11-08 ENCOUNTER — Telehealth (INDEPENDENT_AMBULATORY_CARE_PROVIDER_SITE_OTHER): Payer: Self-pay | Admitting: Internal Medicine

## 2018-11-08 NOTE — Telephone Encounter (Signed)
Patient left message on 2/21 stating she came to the office for her appointment and the office was closed.  Also stated she didn't receive a phone call.  Patient was called back to let her know that we did reach out to her on 2-19 and 2-20 and left messages on her voice mail and her Email  Patient stated she did not check her messages until Sunday 2-23 - patient did not want to reschedule - stated she would be going to her PCP on Tuesday 2-25 - she would discuss rescheduling with him because she seems to be feeling much better.

## 2018-11-09 DIAGNOSIS — N183 Chronic kidney disease, stage 3 (moderate): Secondary | ICD-10-CM | POA: Diagnosis not present

## 2018-11-09 DIAGNOSIS — E782 Mixed hyperlipidemia: Secondary | ICD-10-CM | POA: Diagnosis not present

## 2018-11-09 DIAGNOSIS — B0223 Postherpetic polyneuropathy: Secondary | ICD-10-CM | POA: Diagnosis not present

## 2018-11-09 DIAGNOSIS — Z6379 Other stressful life events affecting family and household: Secondary | ICD-10-CM | POA: Diagnosis not present

## 2018-11-11 ENCOUNTER — Encounter: Payer: Self-pay | Admitting: Cardiovascular Disease

## 2018-11-11 ENCOUNTER — Ambulatory Visit: Payer: PPO | Admitting: Cardiovascular Disease

## 2018-11-11 VITALS — BP 138/66 | HR 69 | Ht 66.0 in | Wt 143.4 lb

## 2018-11-11 DIAGNOSIS — E785 Hyperlipidemia, unspecified: Secondary | ICD-10-CM | POA: Diagnosis not present

## 2018-11-11 DIAGNOSIS — I739 Peripheral vascular disease, unspecified: Secondary | ICD-10-CM

## 2018-11-11 DIAGNOSIS — I10 Essential (primary) hypertension: Secondary | ICD-10-CM | POA: Diagnosis not present

## 2018-11-11 DIAGNOSIS — I6523 Occlusion and stenosis of bilateral carotid arteries: Secondary | ICD-10-CM | POA: Diagnosis not present

## 2018-11-11 NOTE — Patient Instructions (Addendum)
Medication Instructions:   If you need a refill on your cardiac medications before your next appointment, please call your pharmacy.   Lab work:  If you have labs (blood work) drawn today and your tests are completely normal, you will receive your results only by: Marland Kitchen MyChart Message (if you have MyChart) OR . A paper copy in the mail If you have any lab test that is abnormal or we need to change your treatment, we will call you to review the results.  Testing/Procedures: Your physician has requested that you have a lower extremity arterial duplex with ABI's. This test is an ultrasound of the arteries in the legs. It looks at arterial blood flow in the legs. Allow one hour for Lower Arterial scans. There are no restrictions or special instructions.  Your physician has requested that you have a carotid duplex. This test is an ultrasound of the carotid arteries in your neck. Ir.t looks at blood flow through these arteries that supply the brain with blood. Allow one hour for this exam. There are no restrictions or special instructions.  Follow-Up: At [facility], you and your health needs are our priority.  As part of our continuing mission to provide you with exceptional heart care, we have created designated Provider Care Teams.  These Care Teams include your primary Cardiologist (physician) and Advanced Practice Providers (APPs -  Physician Assistants and Nurse Practitioners) who all work together to provide you with the care you need, when you need it. You will need a follow up appointment in 1 years.  Please call our office 2 months in advance to schedule this appointment.  You may see Dr. Johnsie Cancel or one of the following Advanced Practice Providers on your designated Care Team:   Truitt Merle, NP Cecilie Kicks, NP . Kathyrn Drown, NP

## 2018-11-15 ENCOUNTER — Other Ambulatory Visit: Payer: Self-pay | Admitting: Cardiovascular Disease

## 2018-11-15 DIAGNOSIS — I6523 Occlusion and stenosis of bilateral carotid arteries: Secondary | ICD-10-CM

## 2018-11-15 DIAGNOSIS — E785 Hyperlipidemia, unspecified: Secondary | ICD-10-CM

## 2018-11-15 DIAGNOSIS — I10 Essential (primary) hypertension: Secondary | ICD-10-CM

## 2018-11-15 DIAGNOSIS — I739 Peripheral vascular disease, unspecified: Secondary | ICD-10-CM

## 2018-11-17 ENCOUNTER — Other Ambulatory Visit: Payer: Self-pay | Admitting: Cardiovascular Disease

## 2018-11-17 ENCOUNTER — Encounter (INDEPENDENT_AMBULATORY_CARE_PROVIDER_SITE_OTHER): Payer: Self-pay | Admitting: Internal Medicine

## 2018-11-17 ENCOUNTER — Ambulatory Visit (INDEPENDENT_AMBULATORY_CARE_PROVIDER_SITE_OTHER): Payer: PPO | Admitting: Internal Medicine

## 2018-11-17 VITALS — BP 182/66 | HR 82 | Temp 97.9°F | Ht 66.0 in | Wt 143.8 lb

## 2018-11-17 DIAGNOSIS — K219 Gastro-esophageal reflux disease without esophagitis: Secondary | ICD-10-CM | POA: Diagnosis not present

## 2018-11-17 NOTE — Progress Notes (Signed)
Subjective:    Patient ID: Colleen Parks, female    DOB: [birth year], 84 y.o.   MRN: [id number]  HPI Referred by D.r Karie Kirks for abdominal pain dysphagia. Patient denies dysphagia. She tells me about twice a year her stomach will bother her. She use to take Zantac for the pain. The 2nd week in January she had epigastric pain. States she had the pain four about a month. She started the Zantac and her pain resolved.  She said she broke out with shingles last of January.  She was covered with Valtrex.Since then, she has not had any symptoms. She thinks all of her symptoms were related to the shingles. She says when she has epigastric pain, it is related to the season's changing.  Her appetite is okay. No weight loss.  BMs are normal.     Review of Systems Past Medical History:  Diagnosis Date  . 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 Anxiety   . Cancer [facility])     left breast mastectomy  . Diabetes mellitus    no longer on medication  . GERD (gastroesophageal reflux disease)   . Glomerulonephritis   . HTN (hypertension)   . Hypercholesterolemia   . RAS (renal artery stenosis) ([place])   . Sleep apnea    no cpap use  . Subclavian arterial stenosis [facility])     Past Surgical History:  Procedure Laterality Date  . BIOPSY THYROID    . CATARACT EXTRACTION W/PHACO  12/18/2011   Procedure: CATARACT EXTRACTION PHACO AND INTRAOCULAR LENS PLACEMENT (IOC);  Surgeon: Tonny Branch, MD;  Location: [facility] ORS;  Service: Ophthalmology;  Laterality: Right;  CDE:17.77  . CATARACT EXTRACTION W/PHACO  01/26/2012   Procedure: CATARACT EXTRACTION PHACO AND INTRAOCULAR LENS PLACEMENT (IOC);  Surgeon: Tonny Branch, MD;  Location: [facility] ORS;  Service: Ophthalmology;  Laterality: Left;  CDE 15.23  . COLONOSCOPY N/A 05/10/2014   Procedure: COLONOSCOPY;  Surgeon: Rogene Houston, MD;  Location: [facility] ENDO SUITE;  Service: Endoscopy;  Laterality: N/A;  830  . ENDARTERECTOMY Left 11/13/2016   Procedure: LEFT CAROTID ARTERY  ENDARTERECTOMY;  Surgeon: Angelia Mould, MD;  Location: [place];  Service: Vascular;  Laterality: Left;  . left breast mastectomy with chemo  1995   . MASTECTOMY Left   . PATCH ANGIOPLASTY Left 11/13/2016   Procedure: PATCH ANGIOPLASTY OF THE LEFT CAROTID ARTERY USING HEMASHEILD PACTH;  Surgeon: Angelia Mould, MD;  Location: [place];  Service: Vascular;  Laterality: Left;    Allergies  Allergen Reactions  . Codeine Nausea And Vomiting  . Ezetimibe Other (See Comments)    Myalgias  . Metformin And Related Other (See Comments)    Had kidney problems and nephrologist told her never to take it again  . Statins Other (See Comments)    Myalgias with rosuvastatin 5mg  daily, lovastatin 20mg  daily, pravastatin 80mg  daily    Current Outpatient Medications on File Prior to Visit  Medication Sig Dispense Refill  . acetaminophen (TYLENOL) 325 MG tablet Take 325 mg by mouth every 6 (six) hours as needed for mild pain.    Marland Kitchen amLODipine (NORVASC) 5 MG tablet TAKE 1 TABLET BY MOUTH DAILY. 30 tablet 8  . cyanocobalamin 1000 MCG tablet Take 1,000 mcg by mouth daily.    . Evolocumab (REPATHA SURECLICK) 665 MG/ML SOAJ Inject 1 pen into the skin every 14 (fourteen) days. 2 pen 11  . FLUoxetine (PROZAC) 10 MG capsule Take 10 mg by mouth daily.    . Multiple  Vitamin (MULTIVITAMIN WITH MINERALS) TABS tablet Take 1 tablet by mouth daily. For over 47    . oxybutynin (DITROPAN-XL) 5 MG 24 hr tablet Take 5 mg by mouth daily.    . pantoprazole (PROTONIX) 40 MG tablet Take 1 tablet by mouth as needed.      No current facility-administered medications on file prior to visit.         Objective:   Physical Exam Blood pressure (!) 182/66, pulse 82, temperature 97.9 F (36.6 C), height 5\' 6"  (1.676 m), weight 143 lb 12.8 oz (65.2 kg). Alert and oriented. Skin warm and dry. Oral mucosa is moist.   . Sclera anicteric, conjunctivae is pink. Thyroid not enlarged. No cervical lymphadenopathy. Lungs clear.  Heart regular rate and rhythm.  Abdomen is soft. Bowel sounds are positive. No hepatomegaly. No abdominal masses felt. No tenderness.  No edema to lower extremities.        Assessment & Plan:  Epigastric resolved. She will take the Protonix as needed. If symptoms return, she will call me.

## 2018-11-17 NOTE — Patient Instructions (Signed)
Makes take Pecid as needed.

## 2018-11-18 DIAGNOSIS — E119 Type 2 diabetes mellitus without complications: Secondary | ICD-10-CM | POA: Diagnosis not present

## 2018-11-18 DIAGNOSIS — E782 Mixed hyperlipidemia: Secondary | ICD-10-CM | POA: Diagnosis not present

## 2018-11-18 DIAGNOSIS — E1165 Type 2 diabetes mellitus with hyperglycemia: Secondary | ICD-10-CM | POA: Diagnosis not present

## 2018-11-18 DIAGNOSIS — I10 Essential (primary) hypertension: Secondary | ICD-10-CM | POA: Diagnosis not present

## 2018-11-23 ENCOUNTER — Telehealth: Payer: Self-pay | Admitting: Cardiovascular Disease

## 2018-11-23 NOTE — Telephone Encounter (Signed)
Patient stated her PCP got her lipid and liver panel, canceled appt. And requested patient to have PCP sent our office a copy of lab work. Patient verbalized understanding.

## 2018-11-23 NOTE — Telephone Encounter (Signed)
Left message for patient to call back  

## 2018-11-23 NOTE — Telephone Encounter (Signed)
  Patient states she had lab work done last week at her PCP office and she wants to know if she still needs to come to her lab appointment on 11/24/18. Please let her know.

## 2018-11-24 ENCOUNTER — Other Ambulatory Visit: Payer: PPO

## 2018-12-14 ENCOUNTER — Encounter (HOSPITAL_COMMUNITY): Payer: PPO

## 2018-12-14 ENCOUNTER — Inpatient Hospital Stay (HOSPITAL_COMMUNITY): Admission: RE | Admit: 2018-12-14 | Payer: PPO | Source: Ambulatory Visit

## 2019-01-11 ENCOUNTER — Encounter (HOSPITAL_COMMUNITY): Payer: PPO

## 2019-02-08 ENCOUNTER — Other Ambulatory Visit (HOSPITAL_COMMUNITY): Payer: Self-pay | Admitting: Cardiovascular Disease

## 2019-02-08 ENCOUNTER — Other Ambulatory Visit: Payer: Self-pay

## 2019-02-08 ENCOUNTER — Ambulatory Visit (HOSPITAL_BASED_OUTPATIENT_CLINIC_OR_DEPARTMENT_OTHER)
Admission: RE | Admit: 2019-02-08 | Discharge: 2019-02-08 | Disposition: A | Discharge status: Home / Self Care | Payer: PPO | Source: Ambulatory Visit | Attending: Cardiovascular Disease | Admitting: Cardiovascular Disease

## 2019-02-08 ENCOUNTER — Ambulatory Visit (HOSPITAL_COMMUNITY)
Admission: RE | Admit: 2019-02-08 | Discharge: 2019-02-08 | Disposition: A | Discharge status: Home / Self Care | Payer: PPO | Source: Ambulatory Visit | Attending: Cardiovascular Disease | Admitting: Cardiovascular Disease

## 2019-02-08 DIAGNOSIS — I6523 Occlusion and stenosis of bilateral carotid arteries: Secondary | ICD-10-CM

## 2019-02-08 DIAGNOSIS — I10 Essential (primary) hypertension: Secondary | ICD-10-CM

## 2019-02-08 DIAGNOSIS — E785 Hyperlipidemia, unspecified: Secondary | ICD-10-CM

## 2019-02-08 DIAGNOSIS — I739 Peripheral vascular disease, unspecified: Secondary | ICD-10-CM

## 2019-02-11 ENCOUNTER — Telehealth: Payer: Self-pay | Admitting: Cardiovascular Disease

## 2019-02-11 DIAGNOSIS — I6523 Occlusion and stenosis of bilateral carotid arteries: Secondary | ICD-10-CM

## 2019-02-11 DIAGNOSIS — I739 Peripheral vascular disease, unspecified: Secondary | ICD-10-CM

## 2019-02-11 NOTE — Telephone Encounter (Signed)
Per Dr. Johnsie Cancel, BLE duplex showed Moderate PVD needs f/u with Dr Scot Dock who follows her vascular disease.  Carotids showed, Post left CEA patent 40-59% RICA f/u duplex in a year. Will place order for referral with Dr. Scot Dock and order carotid duplex to have done in one year. Patient aware of results.

## 2019-02-11 NOTE — Telephone Encounter (Signed)
New Message ° ° ° °Pt is returning a call  ° ° °Please call back  °

## 2019-02-24 ENCOUNTER — Telehealth: Payer: Self-pay | Admitting: Cardiovascular Disease

## 2019-02-24 NOTE — Telephone Encounter (Signed)
  Patient wants to speak to [facility] to ask about an appt that was supposed to be made for her with Dr Bobette Mo. She has not heard anything

## 2019-02-24 NOTE — Telephone Encounter (Signed)
Called patient and informed her that referral is in and ready to be scheduled. Gave patient Dr. Ardyth Gal office number to call and schedule her appt.

## 2019-03-16 ENCOUNTER — Ambulatory Visit (INDEPENDENT_AMBULATORY_CARE_PROVIDER_SITE_OTHER): Payer: PPO | Admitting: Vascular Surgery

## 2019-03-16 ENCOUNTER — Encounter: Payer: Self-pay | Admitting: Vascular Surgery

## 2019-03-16 ENCOUNTER — Other Ambulatory Visit: Payer: Self-pay

## 2019-03-16 VITALS — BP 154/70 | HR 79 | Temp 97.2°F | Resp 20 | Ht 66.0 in | Wt 141.0 lb

## 2019-03-16 DIAGNOSIS — I6523 Occlusion and stenosis of bilateral carotid arteries: Secondary | ICD-10-CM

## 2019-03-16 DIAGNOSIS — I739 Peripheral vascular disease, unspecified: Secondary | ICD-10-CM

## 2019-03-16 DIAGNOSIS — N390 Urinary tract infection, site not specified: Secondary | ICD-10-CM | POA: Diagnosis not present

## 2019-03-16 DIAGNOSIS — N3281 Overactive bladder: Secondary | ICD-10-CM | POA: Diagnosis not present

## 2019-03-16 DIAGNOSIS — E782 Mixed hyperlipidemia: Secondary | ICD-10-CM | POA: Diagnosis not present

## 2019-03-16 DIAGNOSIS — I10 Essential (primary) hypertension: Secondary | ICD-10-CM | POA: Diagnosis not present

## 2019-03-16 NOTE — Progress Notes (Signed)
Patient name: Colleen Parks MRN: [id number] DOB: 16-Nov-1934 Sex: female  REASON FOR VISIT:   Follow-up of bilateral carotid disease and peripheral vascular disease.  HPI:   Colleen Parks is a pleasant 84 y.o. female who I last saw him on 07/08/2017.  She underwent a left carotid endarterectomy with Dacron patch angioplasty in March 2018.  When I saw her in October 2018 the left carotid endarterectomy site was widely patent.  She had a 40 to 59% right carotid stenosis.  She has her carotid disease followed by Dr. Johnsie Cancel.  She had follow-up studies on 02/08/2019 which included carotid duplex scan, ABIs, and lower extremity duplex study.  On my history, she is left-handed.  She denies any history of stroke, TIAs, expressive or receptive aphasia, or amaurosis fugax.  She is on TriCor for her cholesterol.  Apparently she is had problems with statins in the past.  She was told to take aspirin daily but stopped taking that because of bruising.  With respect to her peripheral vascular disease, she does admit to calf claudication bilaterally.  This is been going on for years.  Her symptoms are brought on by ambulation and relieved with rest.  She does not have significant thigh or calf hip claudication.  Her symptoms are equal on both sides.  She experiences symptoms at 100 yards.  This does limit her activity some and that she likes to line dance and bowl.  She denies any history of rest pain or nonhealing ulcers.  Past Medical History:  Diagnosis Date  . 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 Anxiety   . Cancer [facility])     left breast mastectomy  . Diabetes mellitus    no longer on medication  . GERD (gastroesophageal reflux disease)   . Glomerulonephritis   . HTN (hypertension)   . Hypercholesterolemia   . RAS (renal artery stenosis) ([place])   . Sleep apnea    no cpap use  . Subclavian arterial stenosis ([facility])     Family History  Problem Relation Age of Onset  . Coronary artery  disease Other        positive for premature- on fathers side  . Anesthesia problems Neg Hx   . Hypotension Neg Hx   . Malignant hyperthermia Neg Hx   . Pseudochol deficiency Neg Hx     SOCIAL HISTORY: Social History   Tobacco Use  . Smoking status: Former Smoker    Packs/day: 0.50    Years: 4.00    Pack years: 2.00    Types: Cigarettes    Quit date: 12/09/1958    Years since quitting: 60.3  . Smokeless tobacco: Never Used  . Tobacco comment: non-smoker  Substance Use Topics  . Alcohol use: No    Alcohol/week: 0.0 standard drinks    Allergies  Allergen Reactions  . Codeine Nausea And Vomiting  . Ezetimibe Other (See Comments)    Myalgias  . Metformin And Related Other (See Comments)    Had kidney problems and nephrologist told her never to take it again  . Statins Other (See Comments)    Myalgias with rosuvastatin 5mg  daily, lovastatin 20mg  daily, pravastatin 80mg  daily    Current Outpatient Medications  Medication Sig Dispense Refill  . acetaminophen (TYLENOL) 325 MG tablet Take 325 mg by mouth every 6 (six) hours as needed for mild pain.    Marland Kitchen amLODipine (NORVASC) 5 MG tablet TAKE 1 TABLET BY MOUTH DAILY. 30 tablet 8  . cyanocobalamin 1000 MCG  tablet Take 1,000 mcg by mouth daily.    . Evolocumab (REPATHA SURECLICK) 062 MG/ML SOAJ Inject 1 pen into the skin every 14 (fourteen) days. 2 pen 11  . fenofibrate (TRICOR) 48 MG tablet TAKE 1 TABLET BY MOUTH ONCE A DAY. 90 tablet 3  . Multiple Vitamin (MULTIVITAMIN WITH MINERALS) TABS tablet Take 1 tablet by mouth daily. For over 66    . FLUoxetine (PROZAC) 10 MG capsule Take 10 mg by mouth daily.    Marland Kitchen oxybutynin (DITROPAN-XL) 5 MG 24 hr tablet Take 5 mg by mouth daily.    . pantoprazole (PROTONIX) 40 MG tablet Take 1 tablet by mouth as needed.      No current facility-administered medications for this visit.     REVIEW OF SYSTEMS:  [X]  denotes positive finding, [ ]  denotes negative finding Cardiac  Comments:  Chest pain  or chest pressure:    Shortness of breath upon exertion:    Short of breath when lying flat:    Irregular heart rhythm:        Vascular    Pain in calf, thigh, or hip brought on by ambulation: x   Pain in feet at night that wakes you up from your sleep:     Blood clot in your veins:    Leg swelling:         Pulmonary    Oxygen at home:    Productive cough:     Wheezing:         Neurologic    Sudden weakness in arms or legs:     Sudden numbness in arms or legs:     Sudden onset of difficulty speaking or slurred speech:    Temporary loss of vision in one eye:     Problems with dizziness:         Gastrointestinal    Blood in stool:     Vomited blood:         Genitourinary    Burning when urinating:     Blood in urine:        Psychiatric    Major depression:         Hematologic    Bleeding problems:    Problems with blood clotting too easily:        Skin    Rashes or ulcers:        Constitutional    Fever or chills:     PHYSICAL EXAM:   Vitals:   03/16/19 0858  BP: (!) 154/70  Pulse: 79  Resp: 20  Temp: (!) 97.2 F (36.2 C)  SpO2: 100%  Weight: 141 lb (64 kg)  Height: 5\' 6"  (1.676 m)    GENERAL: The patient is a well-nourished female, in no acute distress. The vital signs are documented above. CARDIAC: There is a regular rate and rhythm.  VASCULAR: She has a right carotid bruit. On the right side she has a palpable femoral, popliteal pulse.  She has a monophasic posterior tibial signal with a biphasic dorsalis pedis signal. On the left side she has a palpable femoral, popliteal, and posterior tibial pulse.  She has a monophasic dorsalis pedis signal. PULMONARY: There is good air exchange bilaterally without wheezing or rales. ABDOMEN: Soft and non-tender with normal pitched bowel sounds.  MUSCULOSKELETAL: There are no major deformities or cyanosis. NEUROLOGIC: No focal weakness or paresthesias are detected. SKIN: There are no ulcers or rashes noted.  PSYCHIATRIC: The patient has a normal affect.  DATA:  CAROTID DUPLEX: I reviewed the carotid duplex scan that was done on 02/08/2019.  Her left carotid endarterectomy site is widely patent.  She has a stable 40 to 59% right carotid stenosis.  This is not changed in the last year.  I would recommend continued yearly follow-up visit.  This is being done by Dr. Johnsie Cancel.   ARTERIAL DOPPLER STUDY: I have reviewed the arterial Doppler study that was done on 02/08/2019.  The patient had multiphasic signals in both feet with an ABI 58% on the right and 70% on the left.  Toe pressure on the right was 101 mmHg.  Toe pressure on the left was 98 mmHg.  ARTERIAL DUPLEX: I have reviewed the arterial duplex scan that was done on 02/08/2019.  This showed evidence of infrainguinal arterial occlusive disease bilaterally.  On the right there is a moderate distal SFA stenosis.  On the left there is a moderate mid and distal SFA stenosis.  There is also some tibial disease bilaterally.  MEDICAL ISSUES:   BILATERAL CAROTID DISEASE: Her left carotid endarterectomy site is widely patent.  She has a stable 40 to 59% asymptomatic right carotid stenosis.  I would recommend continued yearly follow-up visits.  This is being done at Dr. Kyla Balzarine office.  She is on cholesterol medicine.  She stopped taking her aspirin but I have encouraged her to continue taking her baby aspirin daily.  Given that her carotid disease is followed elsewhere I will see her back as needed.  PERIPHERAL VASCULAR DISEASE: This patient does have evidence of infrainguinal arterial occlusive disease bilaterally.  However her circulation is actually quite reasonable and toe pressures are good.  I explained that typically we would not consider arteriography and intervention unless she had disabling claudication, rest pain, or nonhealing ulcer.  She seemed to describe symptoms that were interfering with her lifestyle as she likes to line dance.  Therefore I  discussed arteriography with her however at this point she was not interested.  I encouraged her to keep walking as much as possible.  We discussed the development of collaterals.  Fortunately she is not a smoker.  I will see her back as needed.  [name] [facility] [phone]

## 2019-05-31 DIAGNOSIS — M25512 Pain in left shoulder: Secondary | ICD-10-CM | POA: Diagnosis not present

## 2019-05-31 DIAGNOSIS — R42 Dizziness and giddiness: Secondary | ICD-10-CM | POA: Diagnosis not present

## 2019-05-31 DIAGNOSIS — H6692 Otitis media, unspecified, left ear: Secondary | ICD-10-CM | POA: Diagnosis not present

## 2019-07-11 DIAGNOSIS — M25512 Pain in left shoulder: Secondary | ICD-10-CM | POA: Diagnosis not present

## 2019-07-13 ENCOUNTER — Other Ambulatory Visit: Payer: Self-pay

## 2019-07-13 ENCOUNTER — Other Ambulatory Visit (HOSPITAL_COMMUNITY): Payer: Self-pay | Admitting: Family Medicine

## 2019-07-13 ENCOUNTER — Ambulatory Visit (HOSPITAL_COMMUNITY)
Admission: RE | Admit: 2019-07-13 | Discharge: 2019-07-13 | Disposition: A | Discharge status: Home / Self Care | Payer: PPO | Source: Ambulatory Visit | Attending: Family Medicine | Admitting: Family Medicine

## 2019-07-13 DIAGNOSIS — R200 Anesthesia of skin: Secondary | ICD-10-CM | POA: Insufficient documentation

## 2019-07-13 DIAGNOSIS — M25512 Pain in left shoulder: Secondary | ICD-10-CM | POA: Diagnosis not present

## 2019-07-13 DIAGNOSIS — M47812 Spondylosis without myelopathy or radiculopathy, cervical region: Secondary | ICD-10-CM | POA: Diagnosis not present

## 2019-07-18 DIAGNOSIS — F419 Anxiety disorder, unspecified: Secondary | ICD-10-CM | POA: Diagnosis not present

## 2019-07-18 DIAGNOSIS — E782 Mixed hyperlipidemia: Secondary | ICD-10-CM | POA: Diagnosis not present

## 2019-07-18 DIAGNOSIS — N184 Chronic kidney disease, stage 4 (severe): Secondary | ICD-10-CM | POA: Diagnosis not present

## 2019-07-18 DIAGNOSIS — I10 Essential (primary) hypertension: Secondary | ICD-10-CM | POA: Diagnosis not present

## 2019-07-25 ENCOUNTER — Telehealth: Payer: Self-pay | Admitting: Pharmacist

## 2019-07-25 DIAGNOSIS — E785 Hyperlipidemia, unspecified: Secondary | ICD-10-CM

## 2019-07-25 NOTE — Telephone Encounter (Signed)
Pt called clinic to schedule annual lipid panel for Repatha insurance coverage. Labs scheduled for 11/17.

## 2019-07-29 DIAGNOSIS — Z23 Encounter for immunization: Secondary | ICD-10-CM | POA: Diagnosis not present

## 2019-08-02 ENCOUNTER — Other Ambulatory Visit: Payer: Self-pay

## 2019-08-02 ENCOUNTER — Other Ambulatory Visit: Payer: PPO

## 2019-08-02 DIAGNOSIS — E785 Hyperlipidemia, unspecified: Secondary | ICD-10-CM | POA: Diagnosis not present

## 2019-08-02 DIAGNOSIS — C50912 Malignant neoplasm of unspecified site of left female breast: Secondary | ICD-10-CM | POA: Diagnosis not present

## 2019-08-02 DIAGNOSIS — C50012 Malignant neoplasm of nipple and areola, left female breast: Secondary | ICD-10-CM | POA: Diagnosis not present

## 2019-08-02 LAB — HEPATIC FUNCTION PANEL
ALT: 16 IU/L (ref 0–32)
AST: 18 IU/L (ref 0–40)
Albumin: 4.3 g/dL (ref 3.6–4.6)
Alkaline Phosphatase: 81 IU/L (ref 39–117)
Bilirubin Total: 0.3 mg/dL (ref 0.0–1.2)
Bilirubin, Direct: 0.09 mg/dL (ref 0.00–0.40)
Total Protein: 6.7 g/dL (ref 6.0–8.5)

## 2019-08-02 LAB — LIPID PANEL
Chol/HDL Ratio: 3.3 ratio (ref 0.0–4.4)
Cholesterol, Total: 169 mg/dL (ref 100–199)
HDL: 51 mg/dL (ref >39)
LDL Chol Calc (NIH): 78 mg/dL (ref 0–99)
Triglycerides: 241 mg/dL — ABNORMAL HIGH (ref 0–149)
VLDL Cholesterol Cal: 40 mg/dL (ref 5–40)

## 2019-08-03 ENCOUNTER — Telehealth: Payer: Self-pay

## 2019-08-03 NOTE — Telephone Encounter (Signed)
Called pt to discuss recent lipid results. Left message for pt to return call

## 2019-08-03 NOTE — Telephone Encounter (Signed)
Called patient to discuss lipid results and recent dietary habits.  Patient said that she was fasting when she got her labs taken. LDL 78 and TG 241 on Repatha and fenofibrate. She reports she has been eating cereal every morning, biscuits a few days per week, sandwiches with white bread for lunch daily, and fried chicken frequently for dinner. Denies drinking alcohol. Eats fish once per week. She also said that she just started going back to the bowling alley for exercise (she hadn't been able to go since March).  Discussed options around adding Vascepa or focusing on diet and exercise to help lower triglycerides. Last TG was 161 in September 2019. Patient expressed that she would prefer dietary modifications and increasing exercise at the bowling alley prior to adding another medication at this time. Reviewed and educated on dietary substitutions to minimize carbohydrate intake.   Will recheck lipids in 3 months to assess TG after lifestyle modifications.

## 2019-08-15 ENCOUNTER — Telehealth: Payer: Self-pay | Admitting: Pharmacist

## 2019-08-15 NOTE — Telephone Encounter (Signed)
Called patient and left Vm for her to return call. Patient was not approved for SNF. We can still apply for [employer] next year when she runs out of Repatha pens. Will need her full SSN to apply in 2021.

## 2019-08-26 ENCOUNTER — Other Ambulatory Visit (HOSPITAL_COMMUNITY): Payer: Self-pay | Admitting: Family Medicine

## 2019-08-26 DIAGNOSIS — Z1231 Encounter for screening mammogram for malignant neoplasm of breast: Secondary | ICD-10-CM

## 2019-09-05 ENCOUNTER — Telehealth: Payer: Self-pay | Admitting: Cardiovascular Disease

## 2019-09-05 NOTE — Telephone Encounter (Signed)
New Message  Patient is calling in about her medication and questions about her medication. Patient would like to speak with the pharmacist. Please give patient a call back.

## 2019-09-05 NOTE — Telephone Encounter (Addendum)
Attempted calling pt back 2x, she yelled into the phone and hung up each time.  Called a 3rd time and pt picked up. Pt states she received letter from [place] saying Repatha pt assistance was denied. Advised pt we left her a message 3 weeks ago with this information. Repatha is remaining Tier 4 on [employer] insurance next year, copay will be $100/month for Repatha. Advised pt we will apply for [employer] - this has been submitted and approved.  Advised pt we are awaiting decision from her insurance company on [place] (appeals sent 12/14) and that we will send rx and [place] to her local pharmacy once appeals is approved.

## 2019-09-14 NOTE — Telephone Encounter (Signed)
Called [employer] [phone] (PBM for [employer]) who states Repatha appeal is still pending and that it can take 60-90 days to review. Advised them this is unacceptable for a medication reauthorization since it would cause a 3 month gap in patient care. Asked them to expedite review since it has already been 2 weeks. They stated they would try. Provided them with our direct fax and phone # for follow up.

## 2019-09-15 NOTE — Telephone Encounter (Signed)
Per Anderson Malta with [employer]- PA was never denied. No PA was submitted for 2020. I have completed a PA over the phone and will received determination within 72 hr.

## 2019-09-15 NOTE — Telephone Encounter (Signed)
Returned phone call. No answer- left VM

## 2019-09-15 NOTE — Telephone Encounter (Signed)
Follow Up  Colleen Parks from [place] is returning a call. Please give call back at [phone].

## 2019-09-20 MED ORDER — REPATHA SURECLICK 140 MG/ML ~~LOC~~ SOAJ: 1.0000 "pen " | SUBCUTANEOUS | 11 refills | Status: AC

## 2019-09-20 NOTE — Addendum Note (Signed)
Addended by: Julanne Schlueter E on: 09/20/2019 12:42 PM   Modules accepted: Orders

## 2019-09-20 NOTE — Telephone Encounter (Addendum)
Prior auth for Repatha approved through 09/14/20. Rx sent to local pharmacy, [place] provided. Pt is aware.

## 2019-09-28 ENCOUNTER — Other Ambulatory Visit (HOSPITAL_COMMUNITY): Payer: Self-pay | Admitting: Family Medicine

## 2019-09-28 ENCOUNTER — Other Ambulatory Visit: Payer: Self-pay

## 2019-09-28 ENCOUNTER — Ambulatory Visit (HOSPITAL_COMMUNITY)
Admission: RE | Admit: 2019-09-28 | Discharge: 2019-09-28 | Disposition: A | Discharge status: Home / Self Care | Payer: PPO | Source: Ambulatory Visit | Attending: Family Medicine | Admitting: Family Medicine

## 2019-09-28 DIAGNOSIS — Z1231 Encounter for screening mammogram for malignant neoplasm of breast: Secondary | ICD-10-CM

## 2019-10-30 ENCOUNTER — Ambulatory Visit: Payer: PPO

## 2019-11-01 DIAGNOSIS — R21 Rash and other nonspecific skin eruption: Secondary | ICD-10-CM | POA: Diagnosis not present

## 2019-11-03 ENCOUNTER — Encounter (HOSPITAL_COMMUNITY): Payer: Self-pay | Admitting: Emergency Medicine

## 2019-11-03 ENCOUNTER — Emergency Department (HOSPITAL_COMMUNITY): Payer: PPO

## 2019-11-03 ENCOUNTER — Inpatient Hospital Stay (HOSPITAL_COMMUNITY)
Admission: EM | Admit: 2019-11-03 | Discharge: 2019-11-09 | DRG: 037 | Disposition: A | Discharge status: Home Health | Payer: PPO | Attending: Internal Medicine | Admitting: Internal Medicine

## 2019-11-03 ENCOUNTER — Other Ambulatory Visit: Payer: Self-pay

## 2019-11-03 DIAGNOSIS — Z9012 Acquired absence of left breast and nipple: Secondary | ICD-10-CM

## 2019-11-03 DIAGNOSIS — R531 Weakness: Secondary | ICD-10-CM | POA: Diagnosis not present

## 2019-11-03 DIAGNOSIS — E1122 Type 2 diabetes mellitus with diabetic chronic kidney disease: Secondary | ICD-10-CM | POA: Diagnosis present

## 2019-11-03 DIAGNOSIS — E1165 Type 2 diabetes mellitus with hyperglycemia: Secondary | ICD-10-CM | POA: Diagnosis present

## 2019-11-03 DIAGNOSIS — G4733 Obstructive sleep apnea (adult) (pediatric): Secondary | ICD-10-CM | POA: Diagnosis not present

## 2019-11-03 DIAGNOSIS — N184 Chronic kidney disease, stage 4 (severe): Secondary | ICD-10-CM | POA: Diagnosis not present

## 2019-11-03 DIAGNOSIS — Z853 Personal history of malignant neoplasm of breast: Secondary | ICD-10-CM

## 2019-11-03 DIAGNOSIS — R29702 NIHSS score 2: Secondary | ICD-10-CM | POA: Diagnosis not present

## 2019-11-03 DIAGNOSIS — I739 Peripheral vascular disease, unspecified: Secondary | ICD-10-CM | POA: Diagnosis not present

## 2019-11-03 DIAGNOSIS — E7800 Pure hypercholesterolemia, unspecified: Secondary | ICD-10-CM | POA: Diagnosis not present

## 2019-11-03 DIAGNOSIS — Z8249 Family history of ischemic heart disease and other diseases of the circulatory system: Secondary | ICD-10-CM

## 2019-11-03 DIAGNOSIS — R29810 Facial weakness: Secondary | ICD-10-CM | POA: Diagnosis present

## 2019-11-03 DIAGNOSIS — U071 COVID-19: Secondary | ICD-10-CM

## 2019-11-03 DIAGNOSIS — R270 Ataxia, unspecified: Secondary | ICD-10-CM | POA: Diagnosis not present

## 2019-11-03 DIAGNOSIS — R630 Anorexia: Secondary | ICD-10-CM | POA: Diagnosis not present

## 2019-11-03 DIAGNOSIS — K219 Gastro-esophageal reflux disease without esophagitis: Secondary | ICD-10-CM | POA: Diagnosis present

## 2019-11-03 DIAGNOSIS — I63411 Cerebral infarction due to embolism of right middle cerebral artery: Secondary | ICD-10-CM | POA: Diagnosis not present

## 2019-11-03 DIAGNOSIS — L89151 Pressure ulcer of sacral region, stage 1: Secondary | ICD-10-CM | POA: Diagnosis present

## 2019-11-03 DIAGNOSIS — Z7982 Long term (current) use of aspirin: Secondary | ICD-10-CM | POA: Diagnosis not present

## 2019-11-03 DIAGNOSIS — Z87891 Personal history of nicotine dependence: Secondary | ICD-10-CM

## 2019-11-03 DIAGNOSIS — Z8673 Personal history of transient ischemic attack (TIA), and cerebral infarction without residual deficits: Secondary | ICD-10-CM | POA: Diagnosis not present

## 2019-11-03 DIAGNOSIS — I10 Essential (primary) hypertension: Secondary | ICD-10-CM | POA: Diagnosis present

## 2019-11-03 DIAGNOSIS — R638 Other symptoms and signs concerning food and fluid intake: Secondary | ICD-10-CM | POA: Diagnosis not present

## 2019-11-03 DIAGNOSIS — Z79899 Other long term (current) drug therapy: Secondary | ICD-10-CM

## 2019-11-03 DIAGNOSIS — Z888 Allergy status to other drugs, medicaments and biological substances status: Secondary | ICD-10-CM

## 2019-11-03 DIAGNOSIS — E119 Type 2 diabetes mellitus without complications: Secondary | ICD-10-CM | POA: Diagnosis not present

## 2019-11-03 DIAGNOSIS — G9341 Metabolic encephalopathy: Secondary | ICD-10-CM | POA: Diagnosis present

## 2019-11-03 DIAGNOSIS — I701 Atherosclerosis of renal artery: Secondary | ICD-10-CM | POA: Diagnosis not present

## 2019-11-03 DIAGNOSIS — L8990 Pressure ulcer of unspecified site, unspecified stage: Secondary | ICD-10-CM | POA: Insufficient documentation

## 2019-11-03 DIAGNOSIS — N179 Acute kidney failure, unspecified: Secondary | ICD-10-CM | POA: Diagnosis not present

## 2019-11-03 DIAGNOSIS — I2699 Other pulmonary embolism without acute cor pulmonale: Secondary | ICD-10-CM

## 2019-11-03 DIAGNOSIS — I6389 Other cerebral infarction: Secondary | ICD-10-CM | POA: Diagnosis not present

## 2019-11-03 DIAGNOSIS — I63511 Cerebral infarction due to unspecified occlusion or stenosis of right middle cerebral artery: Secondary | ICD-10-CM | POA: Diagnosis not present

## 2019-11-03 DIAGNOSIS — I129 Hypertensive chronic kidney disease with stage 1 through stage 4 chronic kidney disease, or unspecified chronic kidney disease: Secondary | ICD-10-CM | POA: Diagnosis present

## 2019-11-03 DIAGNOSIS — R414 Neurologic neglect syndrome: Secondary | ICD-10-CM | POA: Diagnosis present

## 2019-11-03 DIAGNOSIS — F419 Anxiety disorder, unspecified: Secondary | ICD-10-CM | POA: Diagnosis not present

## 2019-11-03 DIAGNOSIS — I771 Stricture of artery: Secondary | ICD-10-CM | POA: Diagnosis present

## 2019-11-03 DIAGNOSIS — I6521 Occlusion and stenosis of right carotid artery: Secondary | ICD-10-CM | POA: Diagnosis not present

## 2019-11-03 DIAGNOSIS — I6529 Occlusion and stenosis of unspecified carotid artery: Secondary | ICD-10-CM | POA: Diagnosis present

## 2019-11-03 DIAGNOSIS — I639 Cerebral infarction, unspecified: Secondary | ICD-10-CM

## 2019-11-03 DIAGNOSIS — W19XXXA Unspecified fall, initial encounter: Secondary | ICD-10-CM | POA: Diagnosis present

## 2019-11-03 DIAGNOSIS — E1159 Type 2 diabetes mellitus with other circulatory complications: Secondary | ICD-10-CM | POA: Diagnosis not present

## 2019-11-03 LAB — COMPREHENSIVE METABOLIC PANEL
ALT: 21 U/L (ref 0–44)
AST: 22 U/L (ref 15–41)
Albumin: 3.6 g/dL (ref 3.5–5.0)
Alkaline Phosphatase: 59 U/L (ref 38–126)
Anion gap: 11 (ref 5–15)
BUN: 48 mg/dL — ABNORMAL HIGH (ref 8–23)
CO2: 24 mmol/L (ref 22–32)
Calcium: 9.9 mg/dL (ref 8.9–10.3)
Chloride: 108 mmol/L (ref 98–111)
Creatinine, Ser: 2.89 mg/dL — ABNORMAL HIGH (ref 0.44–1.00)
GFR calc Af Amer: 17 mL/min — ABNORMAL LOW (ref >60)
GFR calc non Af Amer: 15 mL/min — ABNORMAL LOW (ref >60)
Glucose, Bld: 123 mg/dL — ABNORMAL HIGH (ref 70–99)
Potassium: 4.8 mmol/L (ref 3.5–5.1)
Sodium: 143 mmol/L (ref 135–145)
Total Bilirubin: 0.7 mg/dL (ref 0.3–1.2)
Total Protein: 7.9 g/dL (ref 6.5–8.1)

## 2019-11-03 LAB — LACTIC ACID, PLASMA
Lactic Acid, Venous: 1 mmol/L (ref 0.5–1.9)
Lactic Acid, Venous: 0.8 mmol/L (ref 0.5–1.9)

## 2019-11-03 LAB — TROPONIN I (HIGH SENSITIVITY)
Troponin I (High Sensitivity): 12 ng/L (ref <18)
Troponin I (High Sensitivity): 10 ng/L (ref <18)

## 2019-11-03 LAB — CBC WITH DIFFERENTIAL/PLATELET
Abs Immature Granulocytes: 0.07 10*3/uL (ref 0.00–0.07)
Basophils Absolute: 0 10*3/uL (ref 0.0–0.1)
Basophils Relative: 0 %
Eosinophils Absolute: 0 10*3/uL (ref 0.0–0.5)
Eosinophils Relative: 0 %
HCT: 36.4 % (ref 36.0–46.0)
Hemoglobin: 11.2 g/dL — ABNORMAL LOW (ref 12.0–15.0)
Immature Granulocytes: 1 %
Lymphocytes Relative: 24 %
Lymphs Abs: 1.9 10*3/uL (ref 0.7–4.0)
MCH: 29.6 pg (ref 26.0–34.0)
MCHC: 30.8 g/dL (ref 30.0–36.0)
MCV: 96.3 fL (ref 80.0–100.0)
Monocytes Absolute: 0.8 10*3/uL (ref 0.1–1.0)
Monocytes Relative: 10 %
Neutro Abs: 5.4 10*3/uL (ref 1.7–7.7)
Neutrophils Relative %: 65 %
Platelets: 318 10*3/uL (ref 150–400)
RBC: 3.78 MIL/uL — ABNORMAL LOW (ref 3.87–5.11)
RDW: 11.6 % (ref 11.5–15.5)
WBC: 8.3 10*3/uL (ref 4.0–10.5)
nRBC: 0 % (ref 0.0–0.2)

## 2019-11-03 MED ORDER — SODIUM CHLORIDE 0.9 % IV SOLN: Freq: Once | INTRAVENOUS | Status: AC

## 2019-11-03 MED ORDER — ASPIRIN 325 MG PO TABS
650.0000 mg | ORAL_TABLET | Freq: Once | ORAL | Status: AC
  Administered 2019-11-03: 650 mg via ORAL
  Filled 2019-11-03: qty 2

## 2019-11-03 MED ORDER — SODIUM CHLORIDE 0.9 % IV SOLN: INTRAVENOUS | Status: AC

## 2019-11-03 NOTE — ED Triage Notes (Signed)
PT and daughter report generalized weakness and decreased po intake x7 days since first covid vaccine. PT's daughter also states she has had confusion mostly short term for the past 7 days as well.

## 2019-11-03 NOTE — H&P (Signed)
History and Physical    Colleen Parks:[id number] DOB: 1935-05-25 DOA: 11/03/2019  PCP: Lemmie Evens, MD   Patient coming from: Home  I have personally briefly reviewed patient's old medical records in [place]  Chief Complaint: Confusion, weakness, reduced p.o. intake  HPI: Colleen Parks is a 85 y.o. female with medical history significant for hypertension, diabetes mellitus, ANCA associated vasculitis, glomerulonephritis, left carotid artery stenosis. Patient was brought to the ED with complaints of progressive generalized weakness, with reduced p.o. intake and confusion over the past 7 days.  Episodes began before patient received her Covid 19 vaccine about a week ago. Patient fell earlier today.  No loss of consciousness. On my evaluation patient is awake and alert and oriented to person, place and situation.  She is unable to give me a detailed history.  But answers simple questions appropriately.  She is unable to tell me if she has focal weakness or abnormal sensation of her extremities.  She denies vomiting or loose stools.  Denies dysuria.  No chest pain or difficulty breathing.  ED Course: Blood pressure systolic 341P to 379K.  Creatinine mildly elevated at 2.89 from baseline.  Portable chest x-ray.  Head CT-concerning for acute to early subacute infarct in right parietal lobe.  MRA head/MRI brain-positive for widely scattered acute to subacute infarcts in the right MCA territory with corresponding decreased flow in the right ICA such as due to high-grade carotid stenosis in the neck. EDP to Dr. Cheral Marker on call for neurology at [facility], recommended transfer to [facility], aspirin 650 mg x 1 continue 81 mg daily, give IV fluids, maintain blood pressure between 140- 180, and to be alerted on patients arrival to [facility].   Review of Systems: As per HPI all other systems reviewed and negative.  Past Medical History:  Diagnosis Date  . ANCA-associated  vasculitis ([place])   . Anxiety   . Cancer [facility])     left breast mastectomy  . Diabetes mellitus    no longer on medication  . GERD (gastroesophageal reflux disease)   . Glomerulonephritis   . HTN (hypertension)   . Hypercholesterolemia   . RAS (renal artery stenosis) ([place])   . Sleep apnea    no cpap use  . Subclavian arterial stenosis [facility])     Past Surgical History:  Procedure Laterality Date  . BIOPSY THYROID    . CATARACT EXTRACTION W/PHACO  12/18/2011   Procedure: CATARACT EXTRACTION PHACO AND INTRAOCULAR LENS PLACEMENT (IOC);  Surgeon: Tonny Branch, MD;  Location: [facility] OR[facility];  Service: Ophthalmology;  Laterality: Right;  CDE:17.77  . CATARACT EXTRACTION W/PHACO  01/26/2012   Procedure: CATARACT EXTRACTION PHACO AND INTRAOCULAR LENS PLACEMENT (IOC);  Surgeon: Tonny Branch, MD;  Location: [facility];  Service: Ophthalmology;  Laterality: Left;  CDE 15.23  . COLONOSCOPY N/A 05/10/2014   Procedure: COLONOSCOPY;  Surgeon: Rogene Houston, MD;  Location: [facility] SUITE;  Service: Endoscopy;  Laterality: N/A;  830  . ENDARTERECTOMY Left 11/13/2016   Procedure: LEFT CAROTID ARTERY ENDARTERECTOMY;  Surgeon: Angelia Mould, MD;  Location: [place];  Service: Vascular;  Laterality: Left;  . left breast mastectomy with chemo  1995   . MASTECTOMY Left   . PATCH ANGIOPLASTY Left 11/13/2016   Procedure: PATCH ANGIOPLASTY OF THE LEFT CAROTID ARTERY USING HEMASHEILD PACTH;  Surgeon: Angelia Mould, MD;  Location: [place];  Service: Vascular;  Laterality: Left;     reports that she quit smoking about 60  years ago. Her smoking use included cigarettes. She has a 2.00 pack-year smoking history. She has never used smokeless tobacco. She reports that she does not drink alcohol or use drugs.  Allergies  Allergen Reactions  . Codeine Nausea And Vomiting  . Ezetimibe Other (See Comments)    Myalgias  . Metformin And Related Other (See Comments)    Had kidney problems and nephrologist told her never to take it  again  . Statins Other (See Comments)    Myalgias with rosuvastatin 5mg  daily, lovastatin 20mg  daily, pravastatin 80mg  daily    Family History  Problem Relation Age of Onset  . Coronary artery disease Other        positive for premature- on fathers side  . Anesthesia problems Neg Hx   . Hypotension Neg Hx   . Malignant hyperthermia Neg Hx   . Pseudochol deficiency Neg Hx     Prior to Admission medications   Medication Sig Start Date End Date Taking? Authorizing Provider  acetaminophen (TYLENOL) 325 MG tablet Take 325 mg by mouth every 6 (six) hours as needed for mild pain.   Yes [provider]  amLODipine (NORVASC) 5 MG tablet TAKE 1 TABLET BY MOUTH DAILY. 05/21/16  Yes Josue Hector, MD  cyanocobalamin 1000 MCG tablet Take 1,000 mcg by mouth daily.   Yes [provider]  Evolocumab (REPATHA SURECLICK) 671 MG/ML SOAJ Inject 1 pen into the skin every 14 (fourteen) days. 09/20/19  Yes Josue Hector, MD  fenofibrate (TRICOR) 48 MG tablet TAKE 1 TABLET BY MOUTH ONCE A DAY. 11/17/18  Yes Josue Hector, MD  FLUoxetine (PROZAC) 10 MG capsule Take 10 mg by mouth daily.   Yes [provider]  Multiple Vitamin (MULTIVITAMIN WITH MINERALS) TABS tablet Take 1 tablet by mouth daily. For over 50   Yes [provider]  pantoprazole (PROTONIX) 40 MG tablet Take 1 tablet by mouth as needed.  10/20/18  Yes [provider]  oxybutynin (DITROPAN-XL) 5 MG 24 hr tablet Take 5 mg by mouth daily. 04/13/17   [provider]    Physical Exam: Patient mildly disoriented limiting exam.  Vitals:   11/03/19 2016 11/03/19 2030 11/03/19 2130 11/03/19 2200  BP: (!) 147/58 (!) 144/55 (!) 139/53 (!) 137/49  Pulse: 74 71 77 74  Resp: (!) 23 19 18 20   Temp:      TempSrc:      SpO2: 94% 92% 94% 95%  Weight:      Height:        Constitutional: NAD, calm, comfortable Vitals:   11/03/19 2016 11/03/19 2030 11/03/19 2130 11/03/19 2200  BP: (!) 147/58 (!) 144/55  (!) 139/53 (!) 137/49  Pulse: 74 71 77 74  Resp: (!) 23 19 18 20   Temp:      TempSrc:      SpO2: 94% 92% 94% 95%  Weight:      Height:       Eyes: Right pupil slightly smaller than left otherwise both are round and reactive to light, lids and conjunctivae normal ENMT: Mucous membranes are moist. Posterior pharynx clear of any exudate or lesions. Neck: normal, supple, no masses, no thyromegaly Respiratory: Normal respiratory effort. No accessory muscle use.  Cardiovascular: Regular rate and rhythm,  No extremity edema. 2+ pedal pulses.  Abdomen: no tenderness, no masses palpated. No hepatosplenomegaly. Bowel sounds positive.  Musculoskeletal: no clubbing / cyanosis. No joint deformity upper and lower extremities. Good ROM, no contractures. Normal muscle tone.  Skin: no rashes, lesions, ulcers. No induration Neurologic: Exam limited by patient's mental status, she is unable to fully follow directions, as she is slightly disoriented, left facial droop apparent, sensation examination equivocal, left upper extremity weakness, moving bilateral lower extremities against gravity.  Psychiatric: Appropriate affect, alert and oriented to person place and situation  Labs on Admission: I have personally reviewed following labs and imaging studies  CBC: Recent Labs  Lab 11/03/19 1736  WBC 8.3  NEUTROABS 5.4  HGB 11.2*  HCT 36.4  MCV 96.3  PLT 846   Basic Metabolic Panel: Recent Labs  Lab 11/03/19 1736  NA 143  K 4.8  CL 108  CO2 24  GLUCOSE 123*  BUN 48*  CREATININE 2.89*  CALCIUM 9.9   Liver Function Tests: Recent Labs  Lab 11/03/19 1736  AST 22  ALT 21  ALKPHOS 59  BILITOT 0.7  PROT 7.9  ALBUMIN 3.6    Radiological Exams on Admission: CT Head Wo Contrast  Result Date: 11/03/2019 CLINICAL DATA:  Ataxia and weakness EXAM: CT HEAD WITHOUT CONTRAST TECHNIQUE: Contiguous axial images were obtained from the base of the skull through the vertex without intravenous contrast.  COMPARISON:  None. FINDINGS: Brain: There is an area of hypoattenuation with loss of gray-white differentiation in the right parietal lobe (series 2, image 17). No intracranial hemorrhage or other extra-axial collection. There is periventricular hypoattenuation compatible with chronic microvascular disease. Area of cystic encephalomalacia in the right temporal lobe. Vascular: No abnormal hyperdensity of the major intracranial arteries or dural venous sinuses. No intracranial atherosclerosis. Skull: The visualized skull base, calvarium and extracranial soft tissues are normal. Sinuses/Orbits: No fluid levels or advanced mucosal thickening of the visualized paranasal sinuses. No mastoid or middle ear effusion. The orbits are normal. IMPRESSION: 1. Area of hypoattenuation with loss of gray-white differentiation in the right parietal lobe, concerning for acute to early subacute infarct. MRI may provide better temporal characterization. 2. No intracranial hemorrhage. 3. Old right temporal lobe infarct and findings of chronic microvascular ischemia. Electronically Signed   By: Ulyses Jarred M.D.   On: 11/03/2019 18:51   MR ANGIO HEAD WO CONTRAST  Addendum Date: 11/03/2019   ADDENDUM REPORT: 11/03/2019 20:41 ADDENDUM: Study discussed by telephone with PA-C TAMMY TRIPLETT on 11/03/2019 at 20:38 hours. Electronically Signed   By: Genevie Ann M.D.   On: 11/03/2019 20:41   Result Date: 11/03/2019 CLINICAL DATA:  85 year old female with ataxia and weakness. Acute to subacute infarct in the right parietal lobe on head CT today. EXAM: MRI HEAD WITHOUT CONTRAST MRA HEAD WITHOUT CONTRAST TECHNIQUE: Multiplanar, multiecho pulse sequences of the brain and surrounding structures were obtained without intravenous contrast. Angiographic images of the head were obtained using MRA technique without contrast. COMPARISON:  Head CT 1829 hours today.  Brain MRI 01/31/2015. FINDINGS: MRI HEAD FINDINGS Brain: Study is intermittently degraded  by motion artifact despite repeated imaging attempts. DWI reveals widely scattered cortical and subcortical restricted diffusion in the right hemisphere, with confluent areas of involvement both in the right posterior temporal/aparietal lobe, and also the right anterior corona radiata (series 3 image 37). All of the diffusion abnormality seems limited to the right MCA territory. Both axial and coronal DWI is motion degraded but there is no definite left hemisphere or posterior fossa diffusion restriction. T2 and FLAIR hyperintensity in keeping with cytotoxic edema in the widely scattered areas of acute involvement. No associated hemorrhage or mass effect. Superimposed chronic right temporal lobe region CSF cyst which is  stable to mildly smaller since 2016. No superimposed midline shift, ventriculomegaly, or acute intracranial hemorrhage. Cervicomedullary junction and pituitary are within normal limits. Chronic encephalomalacia in the left occipital pole is new since 2016. And there are also several small chronic right cerebellar infarcts new from the prior MRI, largest in the SCA territory. There is chronic patchy bilateral white matter T2 and FLAIR hyperintensity which is largely stable. T2 heterogeneity in the deep gray nuclei and pons has not significantly changed. Vascular: Major intracranial vascular flow voids are stable since 2016. The right vertebral artery appears dominant. Skull and upper cervical spine: Upper cervical spine degeneration appears increased since 2016 with up to mild spinal stenosis. Visualized bone marrow signal is within normal limits. Sinuses/Orbits: No acute orbit or paranasal sinus findings. Other: Mastoids are clear. Grossly normal visible internal auditory structures. MRA HEAD FINDINGS Antegrade flow in the posterior circulation. Both distal vertebral arteries appear to remain patent although the right is dominant. Normal right PICA origin. No definite distal vertebral stenosis.  Patent vertebrobasilar junction. Patent basilar artery with mild mid basilar stenosis. SCA and right PCA origins are patent. A left posterior communicating artery is present but the left PCA is occluded. Right PCA is mildly irregular but patent. Asymmetric decreased antegrade flow signal in the right ICA siphon compared to the left. Probably artifactual loss of signal in the anterior genu. The distal ICA is patent. Patent left siphon with no definite left ICA stenosis. Patent left ICA terminus. Dominant left and diminutive or absent right ACA A1 segments such that the right ACA is supplied from the left. Normal anterior communicating artery. Visible ACA branches are within normal limits. Left MCA M1 segment bifurcates early. The visible left MCA branches are patent with no high-grade stenosis identified. The diminished right ICA terminates in the MCA. The right M1 and the MCA bifurcation are patent but with decreased flow. IMPRESSION: 1. Positive for widely scattered acute to subacute infarcts in the Right MCA territory with corresponding decreased flow in the Right ICA such as due to high-grade carotid stenosis in the neck. The right M1 and MCA bifurcation appear to remain patent. 2. Scattered infarct related cytotoxic edema in the right MCA territory with no associated hemorrhage or mass effect. 3. 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 Several chronic right cerebellar infarcts are also new since that time. 4. Chronic benign right temporal lobe CSF cyst. Chronic small vessel disease. Electronically Signed: By: Genevie Ann M.D. On: 11/03/2019 20:32   MR BRAIN WO CONTRAST  Addendum Date: 11/03/2019   ADDENDUM REPORT: 11/03/2019 20:41 ADDENDUM: Study discussed by telephone with PA-C TAMMY TRIPLETT on 11/03/2019 at 20:38 hours. Electronically Signed   By: Genevie Ann M.D.   On: 11/03/2019 20:41   Result Date: 11/03/2019 CLINICAL  DATA:  85 year old female with ataxia and weakness. Acute to subacute infarct in the right parietal lobe on head CT today. EXAM: MRI HEAD WITHOUT CONTRAST MRA HEAD WITHOUT CONTRAST TECHNIQUE: Multiplanar, multiecho pulse sequences of the brain and surrounding structures were obtained without intravenous contrast. Angiographic images of the head were obtained using MRA technique without contrast. COMPARISON:  Head CT 1829 hours today.  Brain MRI 01/31/2015. FINDINGS: MRI HEAD FINDINGS Brain: Study is intermittently degraded by motion artifact despite repeated imaging attempts. DWI reveals widely scattered cortical and subcortical restricted diffusion in the right hemisphere, with confluent areas of involvement both in the right posterior temporal/aparietal lobe,  and also the right anterior corona radiata (series 3 image 37). All of the diffusion abnormality seems limited to the right MCA territory. Both axial and coronal DWI is motion degraded but there is no definite left hemisphere or posterior fossa diffusion restriction. T2 and FLAIR hyperintensity in keeping with cytotoxic edema in the widely scattered areas of acute involvement. No associated hemorrhage or mass effect. Superimposed chronic right temporal lobe region CSF cyst which is stable to mildly smaller since 2016. No superimposed midline shift, ventriculomegaly, or acute intracranial hemorrhage. Cervicomedullary junction and pituitary are within normal limits. Chronic encephalomalacia in the left occipital pole is new since 2016. And there are also several small chronic right cerebellar infarcts new from the prior MRI, largest in the SCA territory. There is chronic patchy bilateral white matter T2 and FLAIR hyperintensity which is largely stable. T2 heterogeneity in the deep gray nuclei and pons has not significantly changed. Vascular: Major intracranial vascular flow voids are stable since 2016. The right vertebral artery appears dominant. Skull and  upper cervical spine: Upper cervical spine degeneration appears increased since 2016 with up to mild spinal stenosis. Visualized bone marrow signal is within normal limits. Sinuses/Orbits: No acute orbit or paranasal sinus findings. Other: Mastoids are clear. Grossly normal visible internal auditory structures. MRA HEAD FINDINGS Antegrade flow in the posterior circulation. Both distal vertebral arteries appear to remain patent although the right is dominant. Normal right PICA origin. No definite distal vertebral stenosis. Patent vertebrobasilar junction. Patent basilar artery with mild mid basilar stenosis. SCA and right PCA origins are patent. A left posterior communicating artery is present but the left PCA is occluded. Right PCA is mildly irregular but patent. Asymmetric decreased antegrade flow signal in the right ICA siphon compared to the left. Probably artifactual loss of signal in the anterior genu. The distal ICA is patent. Patent left siphon with no definite left ICA stenosis. Patent left ICA terminus. Dominant left and diminutive or absent right ACA A1 segments such that the right ACA is supplied from the left. Normal anterior communicating artery. Visible ACA branches are within normal limits. Left MCA M1 segment bifurcates early. The visible left MCA branches are patent with no high-grade stenosis identified. The diminished right ICA terminates in the MCA. The right M1 and the MCA bifurcation are patent but with decreased flow. IMPRESSION: 1. Positive for widely scattered acute to subacute infarcts in the Right MCA territory with corresponding decreased flow in the Right ICA such as due to high-grade carotid stenosis in the neck. The right M1 and MCA bifurcation appear to remain patent. 2. Scattered infarct related cytotoxic edema in the right MCA territory with no associated hemorrhage or mass effect. 3. Chronic appearing occlusion of the Left PCA in conjunction with chronic Left PCA territory  encephalomalacia which is new since 2016. Several chronic right cerebellar infarcts are also new since that time. 4. Chronic benign right temporal lobe CSF cyst. Chronic small vessel disease. Electronically Signed: By: Genevie Ann M.D. On: 11/03/2019 20:32   DG Chest Portable 1 View  Result Date: 11/03/2019 CLINICAL DATA:  Weakness and decreased oral intake EXAM: PORTABLE CHEST 1 VIEW COMPARISON:  07/04/2013 FINDINGS: The heart size and mediastinal contours are within normal limits. Both lungs are clear. The visualized skeletal structures are unremarkable. IMPRESSION: No active disease. Electronically Signed   By: Ulyses Jarred M.D.   On: 11/03/2019 18:55    EKG: Independently reviewed.  Sinus rhythm, QTc 443.  No significant ST or T wave  abnormality.  Assessment/Plan Active Problems:   Acute CVA (cerebrovascular accident) ([place])    Acute CVA with high grade left  carotid artery stenosis- per MRI brain/ MRA head-widely scattered acute to subacute infarcts.  - EDP to Dr. Cheral Marker, recommended transfer to [facility], maintain blood pressure 140-180 - Will defer further dedicated carotid artery imaging, CTA MRA or ultrasound - Aspirin 650 mg x 1 given, continue at 1 mg daily - Lipid panel , Hgba1c -Echocardiogram -PT, OT, speech therapy evaluation -Allow for permissive hypertension -Care order instruction to page neurology on arrival to [facility] - Continue fibrate, has not tolerated statins in the past. Also per med lis,she is on evolocumab.  AKI on CKD 4-creatinine 2.89,  baseline a year ago 2.39.  Denies fluid loss history.  Not on diuretics or nephrotoxic's - N/s 75cc/hr x 1 day - BMP a.m  Metabolic encephalopathy- likely due to acute CVA.  Portable chest x-ray without acute abnormality.  WBC 8.3. -Follow-up UA  Hypertension-systolic currently 401U to 159. -Hold home Norvasc 5mg , allow for permissive hypertension  Diabetes mellitus-random glucose 123.  Diet controlled -Daily CBC -  Hgba1c  History of ANCA and glomerulonephritis -Off steroids  DVT prophylaxis: SCDs for now, pending neurology evaluation Code Status: Full code Family Communication: None at bedside. Called patients spouse listed on demographics Dennis Killilea- no response.  Disposition Plan: pending neurology evaluation, may require intervention for her high grade carotid artery stenosis Consults called: Neurology Admission status: Inpatient, telemetry I certify that at the point of admission it is my clinical judgment that the patient will require inpatient hospital care spanning beyond 2 midnights from the point of admission due to high intensity of service, high risk for further deterioration and high frequency of surveillance required. The following factors support the patient status of inpatient: Acute stroke seen on MRI with high-grade stenosis.   Bethena Roys MD [facility]  11/03/2019, 11:11 PM

## 2019-11-03 NOTE — ED Provider Notes (Signed)
[facility] EMERGENCY DEPARTMENT Provider Note   CSN: [id number] Arrival date & time: 11/03/19  1637     History Chief Complaint  Patient presents with  . Weakness    Colleen Parks is a 85 y.o. female.  HPI      Colleen Parks is a 85 y.o. female with history of CKD, GERD, hypertension, and breast cancer with previous mastectomy, she presents to the ER with her daughter.  Her daughter reports generalized weakness, decreased p.o. intake and intermittent confusion.  Symptoms have been present for 2 weeks and gradually worsening.  Daughter reports at times she has difficulty recalling recent activities and has been doing things that are not normal for her.  Patient states that she has not been eating because she no longer has an appetite.  Patient denies any pain, shortness of breath, recent fevers or chills.  No nausea, vomiting, or diarrhea.  Daughter states no new medications.  She did receive her initial Covid vaccine 1 week ago, but states the symptoms were present prior to the vaccination.  Daughter also states that she fell earlier today and appears unsteady on her feet. No head injury or LOC.     Past Medical History:  Diagnosis Date  . 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 Anxiety   . Cancer [facility])     left breast mastectomy  . Diabetes mellitus    no longer on medication  . GERD (gastroesophageal reflux disease)   . Glomerulonephritis   . HTN (hypertension)   . Hypercholesterolemia   . RAS (renal artery stenosis) ([place])   . Sleep apnea    no cpap use  . Subclavian arterial stenosis [facility])     Patient Active Problem List   Diagnosis Date Noted  . Asymptomatic stenosis of left carotid artery 11/13/2016  . Arachnoid cyst 08/18/2014  . Glomerulonephritis 08/01/2013  . Hypercholesterolemia   . RAS (renal artery stenosis) ([place])   . Subclavian arterial stenosis ([place])   . Cancer ([place])   . DM (diabetes mellitus) ([place]) 11/19/2011  . MIXED HYPERLIPIDEMIA 08/19/2009    . ESSENTIAL HYPERTENSION, BENIGN 08/19/2009  . Bilateral carotid bruits 08/19/2009    Past Surgical History:  Procedure Laterality Date  . BIOPSY THYROID    . CATARACT EXTRACTION W/PHACO  12/18/2011   Procedure: CATARACT EXTRACTION PHACO AND INTRAOCULAR LENS PLACEMENT (IOC);  Surgeon: Tonny Branch, MD;  Location: [facility] ORS;  Service: Ophthalmology;  Laterality: Right;  CDE:17.77  . CATARACT EXTRACTION W/PHACO  01/26/2012   Procedure: CATARACT EXTRACTION PHACO AND INTRAOCULAR LENS PLACEMENT (IOC);  Surgeon: Tonny Branch, MD;  Location: [facility] ORS;  Service: Ophthalmology;  Laterality: Left;  CDE 15.23  . COLONOSCOPY N/A 05/10/2014   Procedure: COLONOSCOPY;  Surgeon: Rogene Houston, MD;  Location: [facility] ENDO SUITE;  Service: Endoscopy;  Laterality: N/A;  830  . ENDARTERECTOMY Left 11/13/2016   Procedure: LEFT CAROTID ARTERY ENDARTERECTOMY;  Surgeon: Angelia Mould, MD;  Location: [facility];  Service: Vascular;  Laterality: Left;  . left breast mastectomy with chemo  1995   . MASTECTOMY Left   . PATCH ANGIOPLASTY Left 11/13/2016   Procedure: PATCH ANGIOPLASTY OF THE LEFT CAROTID ARTERY USING HEMASHEILD PACTH;  Surgeon: Angelia Mould, MD;  Location: [place];  Service: Vascular;  Laterality: Left;     OB History    Gravida      Para      Term      Preterm      AB  Living  2     SAB      TAB      Ectopic      Multiple      Live Births              Family History  Problem Relation Age of Onset  . Coronary artery disease Other        positive for premature- on fathers side  . Anesthesia problems Neg Hx   . Hypotension Neg Hx   . Malignant hyperthermia Neg Hx   . Pseudochol deficiency Neg Hx     Social History   Tobacco Use  . Smoking status: Former Smoker    Packs/day: 0.50    Years: 4.00    Pack years: 2.00    Types: Cigarettes    Quit date: 12/09/1958    Years since quitting: 60.9  . Smokeless tobacco: Never Used  . Tobacco comment: non-smoker  Substance Use  Topics  . Alcohol use: No    Alcohol/week: 0.0 standard drinks  . Drug use: No    Home Medications Prior to Admission medications   Medication Sig Start Date End Date Taking? Authorizing Provider  acetaminophen (TYLENOL) 325 MG tablet Take 325 mg by mouth every 6 (six) hours as needed for mild pain.   Yes [provider]  amLODipine (NORVASC) 5 MG tablet TAKE 1 TABLET BY MOUTH DAILY. 05/21/16  Yes Josue Hector, MD  cyanocobalamin 1000 MCG tablet Take 1,000 mcg by mouth daily.   Yes [provider]  Evolocumab (REPATHA SURECLICK) 811 MG/ML SOAJ Inject 1 pen into the skin every 14 (fourteen) days. 09/20/19  Yes Josue Hector, MD  fenofibrate (TRICOR) 48 MG tablet TAKE 1 TABLET BY MOUTH ONCE A DAY. 11/17/18  Yes Josue Hector, MD  FLUoxetine (PROZAC) 10 MG capsule Take 10 mg by mouth daily.   Yes [provider]  Multiple Vitamin (MULTIVITAMIN WITH MINERALS) TABS tablet Take 1 tablet by mouth daily. For over 50   Yes [provider]  pantoprazole (PROTONIX) 40 MG tablet Take 1 tablet by mouth as needed.  10/20/18  Yes [provider]  oxybutynin (DITROPAN-XL) 5 MG 24 hr tablet Take 5 mg by mouth daily. 04/13/17   [provider]    Allergies    Codeine, Ezetimibe, Metformin and related, and Statins  Review of Systems   Review of Systems  Constitutional: Positive for appetite change and fatigue. Negative for activity change, chills and fever.  HENT: Negative for trouble swallowing.   Respiratory: Negative for cough, chest tightness and shortness of breath.   Cardiovascular: Negative for chest pain and leg swelling.  Gastrointestinal: Negative for abdominal pain, diarrhea, nausea and vomiting.  Genitourinary: Positive for decreased urine volume. Negative for dysuria and hematuria.  Musculoskeletal: Negative for arthralgias, back pain, myalgias, neck pain and neck stiffness.  Skin: Negative for rash and wound.  Neurological: Positive for  weakness. Negative for dizziness, syncope, numbness and headaches.  Psychiatric/Behavioral: Positive for confusion. The patient is not nervous/anxious.     Physical Exam Updated Vital Signs BP (!) 159/61 (BP Location: Left Arm)   Pulse 75   Temp 97.9 F (36.6 C) (Oral)   Resp 18   Ht 5\' 6"  (1.676 m)   Wt 63.5 kg   SpO2 95%   BMI 22.60 kg/m   Physical Exam Vitals and nursing note reviewed.  Constitutional:      General: She is not in acute distress.  Appearance: She is not ill-appearing or toxic-appearing.     Comments: Elderly appearing female.   HENT:     Head: Atraumatic.     Mouth/Throat:     Mouth: Mucous membranes are dry.     Comments: Mucous membranes are dry Eyes:     General: Visual field deficit present.     Extraocular Movements: Extraocular movements intact.     Conjunctiva/sclera: Conjunctivae normal.     Pupils: Pupils are equal, round, and reactive to light.     Comments: Pilar Plate left visual field deficit.    Cardiovascular:     Rate and Rhythm: Normal rate and regular rhythm.     Pulses: Normal pulses.  Pulmonary:     Effort: Pulmonary effort is normal. No respiratory distress.     Breath sounds: Normal breath sounds. No wheezing.  Chest:     Chest wall: No tenderness.  Abdominal:     General: There is no distension.     Palpations: Abdomen is soft.     Tenderness: There is no abdominal tenderness. There is no right CVA tenderness, left CVA tenderness or guarding.  Musculoskeletal:        General: Normal range of motion.     Cervical back: Normal range of motion. No rigidity or tenderness.     Right lower leg: No edema.     Left lower leg: No edema.  Skin:    General: Skin is warm.     Capillary Refill: Capillary refill takes less than 2 seconds.  Neurological:     Mental Status: She is alert.     GCS: GCS eye subscore is 4. GCS verbal subscore is 5. GCS motor subscore is 6.     Sensory: Sensation is intact.     Motor: Motor function is  intact.     Coordination: Coordination is intact.     Comments: CN II-XII intact.  No dysarthria or aphasia.  No pronator drift, no facial droop     ED Results / Procedures / Treatments   Labs (all labs ordered are listed, but only abnormal results are displayed) Labs Reviewed  COMPREHENSIVE METABOLIC PANEL - Abnormal; Notable for the following components:      Result Value   Glucose, Bld 123 (*)    BUN 48 (*)    Creatinine, Ser 2.89 (*)    GFR calc non Af Amer 15 (*)    GFR calc Af Amer 17 (*)    All other components within normal limits  CBC WITH DIFFERENTIAL/PLATELET - Abnormal; Notable for the following components:   RBC 3.78 (*)    Hemoglobin 11.2 (*)    All other components within normal limits  LACTIC ACID, PLASMA  URINALYSIS, ROUTINE W REFLEX MICROSCOPIC  LACTIC ACID, PLASMA  TROPONIN I (HIGH SENSITIVITY)  TROPONIN I (HIGH SENSITIVITY)    EKG EKG Interpretation  Date/Time:  Thursday November 03 2019 19:07:02 EST Ventricular Rate:  72 PR Interval:    QRS Duration: 79 QT Interval:  404 QTC Calculation: 443 R Axis:   51 Text Interpretation: Sinus rhythm RSR' in V1 or V2, right VCD or RVH Confirmed by Madalyn Rob [phone]) on 11/03/2019 8:13:30 PM   Radiology CT Head Wo Contrast  Result Date: 11/03/2019 CLINICAL DATA:  Ataxia and weakness EXAM: CT HEAD WITHOUT CONTRAST TECHNIQUE: Contiguous axial images were obtained from the base of the skull through the vertex without intravenous contrast. COMPARISON:  None. FINDINGS: Brain: There is an area of hypoattenuation with loss of  gray-white differentiation in the right parietal lobe (series 2, image 17). No intracranial hemorrhage or other extra-axial collection. There is periventricular hypoattenuation compatible with chronic microvascular disease. Area of cystic encephalomalacia in the right temporal lobe. Vascular: No abnormal hyperdensity of the major intracranial arteries or dural venous sinuses. No intracranial  atherosclerosis. Skull: The visualized skull base, calvarium and extracranial soft tissues are normal. Sinuses/Orbits: No fluid levels or advanced mucosal thickening of the visualized paranasal sinuses. No mastoid or middle ear effusion. The orbits are normal. IMPRESSION: 1. Area of hypoattenuation with loss of gray-white differentiation in the right parietal lobe, concerning for acute to early subacute infarct. MRI may provide better temporal characterization. 2. No intracranial hemorrhage. 3. Old right temporal lobe infarct and findings of chronic microvascular ischemia. Electronically Signed   By: Ulyses Jarred M.D.   On: 11/03/2019 18:51   MR ANGIO HEAD WO CONTRAST  Addendum Date: 11/03/2019   ADDENDUM REPORT: 11/03/2019 20:41 ADDENDUM: Study discussed by telephone with PA-C Kataleia Quaranta on 11/03/2019 at 20:38 hours. Electronically Signed   By: Genevie Ann M.D.   On: 11/03/2019 20:41   Result Date: 11/03/2019 CLINICAL DATA:  85 year old female with ataxia and weakness. Acute to subacute infarct in the right parietal lobe on head CT today. EXAM: MRI HEAD WITHOUT CONTRAST MRA HEAD WITHOUT CONTRAST TECHNIQUE: Multiplanar, multiecho pulse sequences of the brain and surrounding structures were obtained without intravenous contrast. Angiographic images of the head were obtained using MRA technique without contrast. COMPARISON:  Head CT 1829 hours today.  Brain MRI 01/31/2015. FINDINGS: MRI HEAD FINDINGS Brain: Study is intermittently degraded by motion artifact despite repeated imaging attempts. DWI reveals widely scattered cortical and subcortical restricted diffusion in the right hemisphere, with confluent areas of involvement both in the right posterior temporal/aparietal lobe, and also the right anterior corona radiata (series 3 image 37). All of the diffusion abnormality seems limited to the right MCA territory. Both axial and coronal DWI is motion degraded but there is no definite left hemisphere or posterior  fossa diffusion restriction. T2 and FLAIR hyperintensity in keeping with cytotoxic edema in the widely scattered areas of acute involvement. No associated hemorrhage or mass effect. Superimposed chronic right temporal lobe region CSF cyst which is stable to mildly smaller since 2016. No superimposed midline shift, ventriculomegaly, or acute intracranial hemorrhage. Cervicomedullary junction and pituitary are within normal limits. Chronic encephalomalacia in the left occipital pole is new since 2016. And there are also several small chronic right cerebellar infarcts new from the prior MRI, largest in the SCA territory. There is chronic patchy bilateral white matter T2 and FLAIR hyperintensity which is largely stable. T2 heterogeneity in the deep gray nuclei and pons has not significantly changed. Vascular: Major intracranial vascular flow voids are stable since 2016. The right vertebral artery appears dominant. Skull and upper cervical spine: Upper cervical spine degeneration appears increased since 2016 with up to mild spinal stenosis. Visualized bone marrow signal is within normal limits. Sinuses/Orbits: No acute orbit or paranasal sinus findings. Other: Mastoids are clear. Grossly normal visible internal auditory structures. MRA HEAD FINDINGS Antegrade flow in the posterior circulation. Both distal vertebral arteries appear to remain patent although the right is dominant. Normal right PICA origin. No definite distal vertebral stenosis. Patent vertebrobasilar junction. Patent basilar artery with mild mid basilar stenosis. SCA and right PCA origins are patent. A left posterior communicating artery is present but the left PCA is occluded. Right PCA is mildly irregular but patent. Asymmetric decreased antegrade flow  signal in the right ICA siphon compared to the left. Probably artifactual loss of signal in the anterior genu. The distal ICA is patent. Patent left siphon with no definite left ICA stenosis. Patent left  ICA terminus. Dominant left and diminutive or absent right ACA A1 segments such that the right ACA is supplied from the left. Normal anterior communicating artery. Visible ACA branches are within normal limits. Left MCA M1 segment bifurcates early. The visible left MCA branches are patent with no high-grade stenosis identified. The diminished right ICA terminates in the MCA. The right M1 and the MCA bifurcation are patent but with decreased flow. IMPRESSION: 1. Positive for widely scattered acute to subacute infarcts in the Right MCA territory with corresponding decreased flow in the Right ICA such as due to high-grade carotid stenosis in the neck. The right M1 and MCA bifurcation appear to remain patent. 2. Scattered infarct related cytotoxic edema in the right MCA territory with no associated hemorrhage or mass effect. 3. 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 Several chronic right cerebellar infarcts are also new since that time. 4. Chronic benign right temporal lobe CSF cyst. Chronic small vessel disease. Electronically Signed: By: Genevie Ann M.D. On: 11/03/2019 20:32   MR BRAIN WO CONTRAST  Addendum Date: 11/03/2019   ADDENDUM REPORT: 11/03/2019 20:41 ADDENDUM: Study discussed by telephone with PA-C Kember Boch on 11/03/2019 at 20:38 hours. Electronically Signed   By: Genevie Ann M.D.   On: 11/03/2019 20:41   Result Date: 11/03/2019 CLINICAL DATA:  85 year old female with ataxia and weakness. Acute to subacute infarct in the right parietal lobe on head CT today. EXAM: MRI HEAD WITHOUT CONTRAST MRA HEAD WITHOUT CONTRAST TECHNIQUE: Multiplanar, multiecho pulse sequences of the brain and surrounding structures were obtained without intravenous contrast. Angiographic images of the head were obtained using MRA technique without contrast. COMPARISON:  Head CT 1829 hours today.  Brain MRI 01/31/2015. FINDINGS: MRI HEAD  FINDINGS Brain: Study is intermittently degraded by motion artifact despite repeated imaging attempts. DWI reveals widely scattered cortical and subcortical restricted diffusion in the right hemisphere, with confluent areas of involvement both in the right posterior temporal/aparietal lobe, and also the right anterior corona radiata (series 3 image 37). All of the diffusion abnormality seems limited to the right MCA territory. Both axial and coronal DWI is motion degraded but there is no definite left hemisphere or posterior fossa diffusion restriction. T2 and FLAIR hyperintensity in keeping with cytotoxic edema in the widely scattered areas of acute involvement. No associated hemorrhage or mass effect. Superimposed chronic right temporal lobe region CSF cyst which is stable to mildly smaller since 2016. No superimposed midline shift, ventriculomegaly, or acute intracranial hemorrhage. Cervicomedullary junction and pituitary are within normal limits. Chronic encephalomalacia in the left occipital pole is new since 2016. And there are also several small chronic right cerebellar infarcts new from the prior MRI, largest in the SCA territory. There is chronic patchy bilateral white matter T2 and FLAIR hyperintensity which is largely stable. T2 heterogeneity in the deep gray nuclei and pons has not significantly changed. Vascular: Major intracranial vascular flow voids are stable since 2016. The right vertebral artery appears dominant. Skull and upper cervical spine: Upper cervical spine degeneration appears increased since 2016 with up to mild spinal stenosis. Visualized bone marrow signal is within normal limits. Sinuses/Orbits: No acute orbit or paranasal sinus findings. Other: Mastoids are clear. Grossly normal visible  internal auditory structures. MRA HEAD FINDINGS Antegrade flow in the posterior circulation. Both distal vertebral arteries appear to remain patent although the right is dominant. Normal right PICA  origin. No definite distal vertebral stenosis. Patent vertebrobasilar junction. Patent basilar artery with mild mid basilar stenosis. SCA and right PCA origins are patent. A left posterior communicating artery is present but the left PCA is occluded. Right PCA is mildly irregular but patent. Asymmetric decreased antegrade flow signal in the right ICA siphon compared to the left. Probably artifactual loss of signal in the anterior genu. The distal ICA is patent. Patent left siphon with no definite left ICA stenosis. Patent left ICA terminus. Dominant left and diminutive or absent right ACA A1 segments such that the right ACA is supplied from the left. Normal anterior communicating artery. Visible ACA branches are within normal limits. Left MCA M1 segment bifurcates early. The visible left MCA branches are patent with no high-grade stenosis identified. The diminished right ICA terminates in the MCA. The right M1 and the MCA bifurcation are patent but with decreased flow. IMPRESSION: 1. Positive for widely scattered acute to subacute infarcts in the Right MCA territory with corresponding decreased flow in the Right ICA such as due to high-grade carotid stenosis in the neck. The right M1 and MCA bifurcation appear to remain patent. 2. Scattered infarct related cytotoxic edema in the right MCA territory with no associated hemorrhage or mass effect. 3. 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 Several chronic right cerebellar infarcts are also new since that time. 4. Chronic benign right temporal lobe CSF cyst. Chronic small vessel disease. Electronically Signed: By: Genevie Ann M.D. On: 11/03/2019 20:32   DG Chest Portable 1 View  Result Date: 11/03/2019 CLINICAL DATA:  Weakness and decreased oral intake EXAM: PORTABLE CHEST 1 VIEW COMPARISON:  07/04/2013 FINDINGS: The heart size and mediastinal contours are within normal limits.  Both lungs are clear. The visualized skeletal structures are unremarkable. IMPRESSION: No active disease. Electronically Signed   By: Ulyses Jarred M.D.   On: 11/03/2019 18:55    Procedures Procedures (including critical care time)  CRITICAL CARE Performed by: Eliam Snapp Total critical care time: 35 minutes Critical care time was exclusive of separately billable procedures and treating other patients. Critical care was necessary to treat or prevent imminent or life-threatening deterioration. Critical care was time spent personally by me on the following activities: development of treatment plan with patient and/or surrogate as well as nursing, discussions with consultants, evaluation of patient's response to treatment, examination of patient, obtaining history from patient or surrogate, ordering and performing treatments and interventions, ordering and review of laboratory studies, ordering and review of radiographic studies, pulse oximetry and re-evaluation of patient's condition.   Medications Ordered in ED Medications - No data to display  ED Course  I have reviewed the triage vital signs and the nursing notes.  Pertinent labs & imaging results that were available during my care of the patient were reviewed by me and considered in my medical decision making (see chart for details).    MDM Rules/Calculators/A&P                      2105  Consulted Dr. Luciana Axe who reviewed pt's MRI.  He recommends transfer to [facility] and hospitalist admit , 650 mg aspirin if patient able to swallow, then 81 mg/day, IV fluids, and maintain systolic blood pressure  of 140-180.  He request to be notified upon patient's arrival.  2150  [facility] hospitalist, Dr. Denton Brick, who agrees to admit.  Final Clinical Impression(s) / ED Diagnoses Final diagnoses:  Cerebrovascular accident (CVA), unspecified mechanism [facility])    Rx / DC Orders ED Discharge Orders    None       Bufford Lope 11/03/19  2156    Lucrezia Starch, MD 11/05/19 [phone]

## 2019-11-04 ENCOUNTER — Inpatient Hospital Stay (HOSPITAL_COMMUNITY): Payer: PPO

## 2019-11-04 DIAGNOSIS — U071 COVID-19: Secondary | ICD-10-CM | POA: Diagnosis not present

## 2019-11-04 DIAGNOSIS — R29810 Facial weakness: Secondary | ICD-10-CM | POA: Diagnosis not present

## 2019-11-04 DIAGNOSIS — E1122 Type 2 diabetes mellitus with diabetic chronic kidney disease: Secondary | ICD-10-CM | POA: Diagnosis not present

## 2019-11-04 DIAGNOSIS — N184 Chronic kidney disease, stage 4 (severe): Secondary | ICD-10-CM | POA: Diagnosis not present

## 2019-11-04 DIAGNOSIS — I63511 Cerebral infarction due to unspecified occlusion or stenosis of right middle cerebral artery: Secondary | ICD-10-CM | POA: Diagnosis not present

## 2019-11-04 DIAGNOSIS — K219 Gastro-esophageal reflux disease without esophagitis: Secondary | ICD-10-CM | POA: Diagnosis not present

## 2019-11-04 DIAGNOSIS — E7800 Pure hypercholesterolemia, unspecified: Secondary | ICD-10-CM | POA: Diagnosis not present

## 2019-11-04 DIAGNOSIS — R414 Neurologic neglect syndrome: Secondary | ICD-10-CM | POA: Diagnosis not present

## 2019-11-04 DIAGNOSIS — E1165 Type 2 diabetes mellitus with hyperglycemia: Secondary | ICD-10-CM | POA: Diagnosis not present

## 2019-11-04 DIAGNOSIS — I129 Hypertensive chronic kidney disease with stage 1 through stage 4 chronic kidney disease, or unspecified chronic kidney disease: Secondary | ICD-10-CM | POA: Diagnosis not present

## 2019-11-04 DIAGNOSIS — G4733 Obstructive sleep apnea (adult) (pediatric): Secondary | ICD-10-CM | POA: Diagnosis not present

## 2019-11-04 DIAGNOSIS — I701 Atherosclerosis of renal artery: Secondary | ICD-10-CM | POA: Diagnosis not present

## 2019-11-04 DIAGNOSIS — Z8673 Personal history of transient ischemic attack (TIA), and cerebral infarction without residual deficits: Secondary | ICD-10-CM | POA: Diagnosis not present

## 2019-11-04 DIAGNOSIS — R29702 NIHSS score 2: Secondary | ICD-10-CM | POA: Diagnosis not present

## 2019-11-04 DIAGNOSIS — Z9012 Acquired absence of left breast and nipple: Secondary | ICD-10-CM | POA: Diagnosis not present

## 2019-11-04 DIAGNOSIS — Z888 Allergy status to other drugs, medicaments and biological substances status: Secondary | ICD-10-CM | POA: Diagnosis not present

## 2019-11-04 DIAGNOSIS — G9341 Metabolic encephalopathy: Secondary | ICD-10-CM | POA: Diagnosis not present

## 2019-11-04 DIAGNOSIS — Z7982 Long term (current) use of aspirin: Secondary | ICD-10-CM | POA: Diagnosis not present

## 2019-11-04 DIAGNOSIS — Z853 Personal history of malignant neoplasm of breast: Secondary | ICD-10-CM | POA: Diagnosis not present

## 2019-11-04 DIAGNOSIS — L89151 Pressure ulcer of sacral region, stage 1: Secondary | ICD-10-CM | POA: Diagnosis not present

## 2019-11-04 DIAGNOSIS — I6529 Occlusion and stenosis of unspecified carotid artery: Secondary | ICD-10-CM | POA: Diagnosis present

## 2019-11-04 DIAGNOSIS — I6521 Occlusion and stenosis of right carotid artery: Secondary | ICD-10-CM | POA: Diagnosis not present

## 2019-11-04 DIAGNOSIS — F419 Anxiety disorder, unspecified: Secondary | ICD-10-CM | POA: Diagnosis not present

## 2019-11-04 DIAGNOSIS — N179 Acute kidney failure, unspecified: Secondary | ICD-10-CM | POA: Diagnosis not present

## 2019-11-04 DIAGNOSIS — I6389 Other cerebral infarction: Secondary | ICD-10-CM

## 2019-11-04 DIAGNOSIS — I771 Stricture of artery: Secondary | ICD-10-CM | POA: Diagnosis not present

## 2019-11-04 LAB — HEMOGLOBIN A1C
Hgb A1c MFr Bld: 7.5 % — ABNORMAL HIGH (ref 4.8–5.6)
Mean Plasma Glucose: 168.55 mg/dL

## 2019-11-04 LAB — LIPID PANEL
Cholesterol: 121 mg/dL (ref 0–200)
HDL: 36 mg/dL — ABNORMAL LOW (ref >40)
LDL Cholesterol: 44 mg/dL (ref 0–99)
Total CHOL/HDL Ratio: 3.4 RATIO
Triglycerides: 203 mg/dL — ABNORMAL HIGH (ref <150)
VLDL: 41 mg/dL — ABNORMAL HIGH (ref 0–40)

## 2019-11-04 LAB — URINALYSIS, ROUTINE W REFLEX MICROSCOPIC
Bilirubin Urine: NEGATIVE
Glucose, UA: NEGATIVE mg/dL
Hgb urine dipstick: NEGATIVE
Ketones, ur: NEGATIVE mg/dL
Nitrite: NEGATIVE
Protein, ur: 100 mg/dL — AB
Specific Gravity, Urine: 1.014 (ref 1.005–1.030)
pH: 5 (ref 5.0–8.0)

## 2019-11-04 LAB — BASIC METABOLIC PANEL
Anion gap: 11 (ref 5–15)
BUN: 48 mg/dL — ABNORMAL HIGH (ref 8–23)
CO2: 24 mmol/L (ref 22–32)
Calcium: 9.4 mg/dL (ref 8.9–10.3)
Chloride: 110 mmol/L (ref 98–111)
Creatinine, Ser: 2.94 mg/dL — ABNORMAL HIGH (ref 0.44–1.00)
GFR calc Af Amer: 16 mL/min — ABNORMAL LOW (ref >60)
GFR calc non Af Amer: 14 mL/min — ABNORMAL LOW (ref >60)
Glucose, Bld: 101 mg/dL — ABNORMAL HIGH (ref 70–99)
Potassium: 4.6 mmol/L (ref 3.5–5.1)
Sodium: 145 mmol/L (ref 135–145)

## 2019-11-04 LAB — CBG MONITORING, ED
Glucose-Capillary: 94 mg/dL (ref 70–99)
Glucose-Capillary: 115 mg/dL — ABNORMAL HIGH (ref 70–99)

## 2019-11-04 LAB — ECHOCARDIOGRAM COMPLETE
Height: 66 in
Weight: 2240 oz

## 2019-11-04 LAB — LACTATE DEHYDROGENASE: LDH: 151 U/L (ref 98–192)

## 2019-11-04 LAB — SARS CORONAVIRUS 2 (TAT 6-24 HRS): SARS Coronavirus 2: POSITIVE — AB

## 2019-11-04 LAB — ABO/RH: ABO/RH(D): O POS

## 2019-11-04 LAB — FERRITIN: Ferritin: 253 ng/mL (ref 11–307)

## 2019-11-04 LAB — D-DIMER, QUANTITATIVE: D-Dimer, Quant: 2.95 ug/mL-FEU — ABNORMAL HIGH (ref 0.00–0.50)

## 2019-11-04 LAB — C-REACTIVE PROTEIN: CRP: 2.7 mg/dL — ABNORMAL HIGH (ref <1.0)

## 2019-11-04 MED ORDER — INSULIN ASPART 100 UNIT/ML ~~LOC~~ SOLN
0.0000 [IU] | Freq: Three times a day (TID) | SUBCUTANEOUS | Status: DC
  Administered 2019-11-07 (×2): 1 [IU] via SUBCUTANEOUS

## 2019-11-04 MED ORDER — ASPIRIN 81 MG PO CHEW
81.0000 mg | CHEWABLE_TABLET | Freq: Every day | ORAL | Status: DC
  Administered 2019-11-04: 81 mg via ORAL
  Filled 2019-11-04: qty 1

## 2019-11-04 MED ORDER — FOLIC ACID 1 MG PO TABS
1.0000 mg | ORAL_TABLET | Freq: Every day | ORAL | Status: DC
  Administered 2019-11-04 – 2019-11-09 (×6): 1 mg via ORAL
  Filled 2019-11-04 (×6): qty 1

## 2019-11-04 MED ORDER — GUAIFENESIN-DM 100-10 MG/5ML PO SYRP: 10.0000 mL | ORAL_SOLUTION | ORAL | Status: DC | PRN

## 2019-11-04 MED ORDER — ASCORBIC ACID 500 MG PO TABS
500.0000 mg | ORAL_TABLET | Freq: Every day | ORAL | Status: DC
  Administered 2019-11-04 – 2019-11-09 (×6): 500 mg via ORAL
  Filled 2019-11-04 (×6): qty 1

## 2019-11-04 MED ORDER — THIAMINE HCL 100 MG PO TABS
100.0000 mg | ORAL_TABLET | Freq: Every day | ORAL | Status: DC
  Administered 2019-11-04 – 2019-11-09 (×6): 100 mg via ORAL
  Filled 2019-11-04 (×6): qty 1

## 2019-11-04 MED ORDER — HEPARIN SODIUM (PORCINE) 5000 UNIT/ML IJ SOLN
5000.0000 [IU] | Freq: Three times a day (TID) | INTRAMUSCULAR | Status: DC
  Administered 2019-11-04 – 2019-11-07 (×9): 5000 [IU] via SUBCUTANEOUS
  Filled 2019-11-04 (×9): qty 1

## 2019-11-04 MED ORDER — ACETAMINOPHEN 325 MG PO TABS
650.0000 mg | ORAL_TABLET | ORAL | Status: DC | PRN
  Administered 2019-11-08 – 2019-11-09 (×2): 650 mg via ORAL
  Filled 2019-11-04 (×2): qty 2

## 2019-11-04 MED ORDER — ZINC SULFATE 220 (50 ZN) MG PO CAPS
220.0000 mg | ORAL_CAPSULE | Freq: Every day | ORAL | Status: DC
  Administered 2019-11-04 – 2019-11-09 (×6): 220 mg via ORAL
  Filled 2019-11-04 (×6): qty 1

## 2019-11-04 MED ORDER — SENNOSIDES-DOCUSATE SODIUM 8.6-50 MG PO TABS
1.0000 | ORAL_TABLET | Freq: Every evening | ORAL | Status: DC | PRN
  Administered 2019-11-09: 1 via ORAL
  Filled 2019-11-04 (×2): qty 1

## 2019-11-04 MED ORDER — HYDROCOD POLST-CPM POLST ER 10-8 MG/5ML PO SUER: 5.0000 mL | Freq: Two times a day (BID) | ORAL | Status: DC | PRN

## 2019-11-04 MED ORDER — ASPIRIN 325 MG PO TABS
325.0000 mg | ORAL_TABLET | Freq: Every day | ORAL | Status: DC
  Administered 2019-11-04 – 2019-11-06 (×3): 325 mg via ORAL
  Filled 2019-11-04 (×3): qty 1

## 2019-11-04 MED ORDER — ACETAMINOPHEN 650 MG RE SUPP: 650.0000 mg | RECTAL | Status: DC | PRN

## 2019-11-04 MED ORDER — FENOFIBRATE 54 MG PO TABS
54.0000 mg | ORAL_TABLET | Freq: Every day | ORAL | Status: DC
  Administered 2019-11-04 – 2019-11-09 (×5): 54 mg via ORAL
  Filled 2019-11-04 (×8): qty 1

## 2019-11-04 MED ORDER — STROKE: EARLY STAGES OF RECOVERY BOOK
Freq: Once | Status: AC
  Filled 2019-11-04 (×2): qty 1

## 2019-11-04 MED ORDER — ADULT MULTIVITAMIN W/MINERALS CH
1.0000 | ORAL_TABLET | Freq: Every day | ORAL | Status: DC
  Administered 2019-11-04 – 2019-11-09 (×6): 1 via ORAL
  Filled 2019-11-04 (×6): qty 1

## 2019-11-04 MED ORDER — ACETAMINOPHEN 160 MG/5ML PO SOLN: 650.0000 mg | ORAL | Status: DC | PRN

## 2019-11-04 NOTE — ED Notes (Signed)
Called [facility] and RN verified that report was given prior.

## 2019-11-04 NOTE — ED Notes (Signed)
Nurse notified patient had pulled out her IV while attempting to go to the bathroom independently. No injury visualized at right [facility]. New IV site to Left [facility]

## 2019-11-04 NOTE — ED Notes (Signed)
Patient did eat some gelating but not very much. Patient was given ginger-ale and did take a few sips of that.

## 2019-11-04 NOTE — ED Notes (Signed)
Dr. Denton Brick verbalized he will call daughter to inform her of room placement change due to patient positive coivd results.

## 2019-11-04 NOTE — Progress Notes (Signed)
Patient Demographics:    Colleen Parks, is a 85 y.o. female, DOB - Dec 19, 1934, HTD:[id number]  Admit date - 11/03/2019   Admitting Physician Ejiroghene Arlyce Dice, MD  Outpatient Primary MD for the patient is Lemmie Evens, MD  LOS - 1  Chief Complaint  Patient presents with  . Weakness       Subjective:    William Dalton today has no fevers, no emesis,  No chest pain,   -Some degree of left-sided neglect, and some loss of left visual field -Swallowing is okay -Some confusion  Assessment  & Plan :    Principal Problem:   Rt MCA Acute CVA (cerebrovascular accident) ([place]) Active Problems:   Subclavian arterial stenosis ([place])   COVID-19 virus infection   Carotid stenosis   Essential hypertension, benign   DM (diabetes mellitus) ([place])   RAS (renal artery stenosis) ([place])  Brief Summary 85 y.o. female with medical history significant for HTN, DM2, ANCA associated vasculitis, glomerulonephritis, left carotid artery stenosis admitted 11/03/2019 with  Acute stroke concerns Dr. Cheral Marker on call for neurology at [facility], recommended transfer to [facility] for further neurology and possibly neurosurgical and vascular surgery evaluation -Patient's COVID-19 test came back positive, she is largely asymptomatic from a Covid infection standpoint  A/p Acute CVA with high grade Carotid artery stenosis- per MRI brain/ MRA head-widely scattered acute to subacute infarcts (Rt MCA) and right cerebellar infarcts-  Dr. Cheral Marker (Neurology) recommended transfer to [facility] due to acute and subacute RT MCA strokes in the setting of Rt ICA high-grade stenosis in the neck, as well as left PCA occlusion.  - Will defer further dedicated carotid artery imaging, CTA or  MRA or ultrasound until patient arrives at [place] --Continue aspirin therapy -Echocardiogram with EF of 60 to 65%, and grade 1 diastolic  dysfunction -PT, OT, speech therapy evaluation -Allow for permissive hypertension (per Neurologist keep systolic BP 726 to 203 mmhg) -Care order instruction to page neurology on arrival to [facility] - Continue fibrate, has not tolerated statins in the past. Also per med lis,she is on evolocumab ([place]) -LDL 44, HDL 36, A1c 7.5  2) COVID-19 infection--- patient is afebrile, no cough, oxygen requirement at 4 L/min via nasal cannula which is patient's baseline PTA -Chest x-ray without infiltrates -At this time patient does not meet criteria for remdesivir or steroids -LDH is 151, ferritin 253 -CRP is elevated at 2.7, D-dimer is elevated at 2.95  --Check and trend fibrinogen, CRP, pro calcitonin, CBC, BMP, d-dimer, LDH, ferritin and LFTs --Supplemental oxygen to keep O2 sats above 93% -Follow serial chest x-rays and ABGs as indicated --Attempt to maintain euvolemic state --Zinc and vitamin C as ordered -Albuterol inhaler as needed  3)Elevated D-dimer--- suspect due to COVID-19 infection, patient without tachycardia, oxygen requirement is at baseline -Patient's creatinine precludes CTA chest -COVID-19 infection makes VQ scan testing challenging -Given acute stroke therapeutic anticoagulation will put patient at risk for hemorrhagic transformation -We will do prophylactic heparin at this time -Get lower extremity venous Dopplers to rule out DVT  4)AKI----acute kidney injury on CKD stage IV -   creatinine on admission=2.89  , baseline creatinine =2.39 (10/17/2018)    , creatinine is now=2.94  , renally adjust medications,  avoid nephrotoxic agents / dehydration  / hypotension -Continue gentle hydration  5)Metabolic encephalopathy- likely due to acute CVA.  Portable chest x-ray without acute abnormality.  WBC 8.3. -UA pending  6)Hypertension-systolic currently 993Z to 159. -Hold home Norvasc 5mg , allow for permissive hypertension  7)Diabetes mellitus- A1C 7.5, reflecting uncontrolled  diabetes PTA -Use Novolog/Humalog Sliding scale insulin with Accu-Cheks/Fingersticks as ordered   8)History of ANCA and glomerulonephritis -Off steroids  DVT prophylaxis:  Subcu heparin Code Status: Full code Family Communication: -Discussed with patient's Daughter  Disposition Plan: pending neurology evaluation at [facility], may require intervention for her high grade carotid artery stenosis Consults called: Neurology  Code Status : Full code  Lab Results  Component Value Date   PLT 318 11/03/2019   Inpatient Medications  Scheduled Meds: .  stroke: mapping our early stages of recovery book   Does not apply Once  . aspirin  81 mg Oral Daily  . fenofibrate  54 mg Oral Daily   Continuous Infusions: . sodium chloride     PRN Meds:.acetaminophen **OR** acetaminophen (TYLENOL) oral liquid 160 mg/5 mL **OR** acetaminophen, senna-docusate    Anti-infectives (From admission, onward)   None        Objective:   Vitals:   11/04/19 0600 11/04/19 0630 11/04/19 0830 11/04/19 1100  BP: 109/90 (!) 142/63 (!) 133/51 (!) 154/53  Pulse: 80 62 66 62  Resp: (!) 23 (!) 24 19 15   Temp:      TempSrc:      SpO2: 97% 95% 93% 95%  Weight:      Height:        Wt Readings from Last 3 Encounters:  11/03/19 63.5 kg  03/16/19 64 kg  11/17/18 65.2 kg    No intake or output data in the 24 hours ending 11/04/19 1215   Physical Exam  Gen:- Awake Alert,  In no apparent distress  HEENT:- [place].AT, No sclera icterus Neck-Supple Neck,No JVD,.  Lungs-  CTAB , fair symmetrical air movement CV- S1, S2 normal, regular  Abd-  +ve B.Sounds, Abd Soft, No tenderness,    Extremity/Skin:- No  edema, pedal pulses present  Psych-some confusional episodes, redirectable Neuro-patient is left-hand dominant, appears to have subtle left hemiparesis, some degree of left-sided neglect, some loss of left lateral visual field  -Tremors noted   Data Review:   Micro Results No results found for this or  any previous visit (from the past 240 hour(s)).  Radiology Reports CT Head Wo Contrast  Result Date: 11/03/2019 CLINICAL DATA:  Ataxia and weakness EXAM: CT HEAD WITHOUT CONTRAST TECHNIQUE: Contiguous axial images were obtained from the base of the skull through the vertex without intravenous contrast. COMPARISON:  None. FINDINGS: Brain: There is an area of hypoattenuation with loss of gray-white differentiation in the right parietal lobe (series 2, image 17). No intracranial hemorrhage or other extra-axial collection. There is periventricular hypoattenuation compatible with chronic microvascular disease. Area of cystic encephalomalacia in the right temporal lobe. Vascular: No abnormal hyperdensity of the major intracranial arteries or dural venous sinuses. No intracranial atherosclerosis. Skull: The visualized skull base, calvarium and extracranial soft tissues are normal. Sinuses/Orbits: No fluid levels or advanced mucosal thickening of the visualized paranasal sinuses. No mastoid or middle ear effusion. The orbits are normal. IMPRESSION: 1. Area of hypoattenuation with loss of gray-white differentiation in the right parietal lobe, concerning for acute to early subacute infarct. MRI may provide better temporal characterization. 2. No intracranial hemorrhage. 3. Old right temporal lobe infarct and  findings of chronic microvascular ischemia. Electronically Signed   By: Ulyses Jarred M.D.   On: 11/03/2019 18:51   MR ANGIO HEAD WO CONTRAST  Addendum Date: 11/03/2019   ADDENDUM REPORT: 11/03/2019 20:41 ADDENDUM: Study discussed by telephone with PA-C TAMMY TRIPLETT on 11/03/2019 at 20:38 hours. Electronically Signed   By: Genevie Ann M.D.   On: 11/03/2019 20:41   Result Date: 11/03/2019 CLINICAL DATA:  85 year old female with ataxia and weakness. Acute to subacute infarct in the right parietal lobe on head CT today. EXAM: MRI HEAD WITHOUT CONTRAST MRA HEAD WITHOUT CONTRAST TECHNIQUE: Multiplanar, multiecho pulse  sequences of the brain and surrounding structures were obtained without intravenous contrast. Angiographic images of the head were obtained using MRA technique without contrast. COMPARISON:  Head CT 1829 hours today.  Brain MRI 01/31/2015. FINDINGS: MRI HEAD FINDINGS Brain: Study is intermittently degraded by motion artifact despite repeated imaging attempts. DWI reveals widely scattered cortical and subcortical restricted diffusion in the right hemisphere, with confluent areas of involvement both in the right posterior temporal/aparietal lobe, and also the right anterior corona radiata (series 3 image 37). All of the diffusion abnormality seems limited to the right MCA territory. Both axial and coronal DWI is motion degraded but there is no definite left hemisphere or posterior fossa diffusion restriction. T2 and FLAIR hyperintensity in keeping with cytotoxic edema in the widely scattered areas of acute involvement. No associated hemorrhage or mass effect. Superimposed chronic right temporal lobe region CSF cyst which is stable to mildly smaller since 2016. No superimposed midline shift, ventriculomegaly, or acute intracranial hemorrhage. Cervicomedullary junction and pituitary are within normal limits. Chronic encephalomalacia in the left occipital pole is new since 2016. And there are also several small chronic right cerebellar infarcts new from the prior MRI, largest in the SCA territory. There is chronic patchy bilateral white matter T2 and FLAIR hyperintensity which is largely stable. T2 heterogeneity in the deep gray nuclei and pons has not significantly changed. Vascular: Major intracranial vascular flow voids are stable since 2016. The right vertebral artery appears dominant. Skull and upper cervical spine: Upper cervical spine degeneration appears increased since 2016 with up to mild spinal stenosis. Visualized bone marrow signal is within normal limits. Sinuses/Orbits: No acute orbit or paranasal sinus  findings. Other: Mastoids are clear. Grossly normal visible internal auditory structures. MRA HEAD FINDINGS Antegrade flow in the posterior circulation. Both distal vertebral arteries appear to remain patent although the right is dominant. Normal right PICA origin. No definite distal vertebral stenosis. Patent vertebrobasilar junction. Patent basilar artery with mild mid basilar stenosis. SCA and right PCA origins are patent. A left posterior communicating artery is present but the left PCA is occluded. Right PCA is mildly irregular but patent. Asymmetric decreased antegrade flow signal in the right ICA siphon compared to the left. Probably artifactual loss of signal in the anterior genu. The distal ICA is patent. Patent left siphon with no definite left ICA stenosis. Patent left ICA terminus. Dominant left and diminutive or absent right ACA A1 segments such that the right ACA is supplied from the left. Normal anterior communicating artery. Visible ACA branches are within normal limits. Left MCA M1 segment bifurcates early. The visible left MCA branches are patent with no high-grade stenosis identified. The diminished right ICA terminates in the MCA. The right M1 and the MCA bifurcation are patent but with decreased flow. IMPRESSION: 1. Positive for widely scattered acute to subacute infarcts in the Right MCA territory with corresponding decreased flow  in the Right ICA such as due to high-grade carotid stenosis in the neck. The right M1 and MCA bifurcation appear to remain patent. 2. Scattered infarct related cytotoxic edema in the right MCA territory with no associated hemorrhage or mass effect. 3. 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 Several chronic right cerebellar infarcts are also new since that time. 4. Chronic benign right temporal lobe CSF cyst. Chronic small vessel disease. Electronically Signed: By: Genevie Ann M.D.  On: 11/03/2019 20:32   MR BRAIN WO CONTRAST  Addendum Date: 11/03/2019   ADDENDUM REPORT: 11/03/2019 20:41 ADDENDUM: Study discussed by telephone with PA-C TAMMY TRIPLETT on 11/03/2019 at 20:38 hours. Electronically Signed   By: Genevie Ann M.D.   On: 11/03/2019 20:41   Result Date: 11/03/2019 CLINICAL DATA:  85 year old female with ataxia and weakness. Acute to subacute infarct in the right parietal lobe on head CT today. EXAM: MRI HEAD WITHOUT CONTRAST MRA HEAD WITHOUT CONTRAST TECHNIQUE: Multiplanar, multiecho pulse sequences of the brain and surrounding structures were obtained without intravenous contrast. Angiographic images of the head were obtained using MRA technique without contrast. COMPARISON:  Head CT 1829 hours today.  Brain MRI 01/31/2015. FINDINGS: MRI HEAD FINDINGS Brain: Study is intermittently degraded by motion artifact despite repeated imaging attempts. DWI reveals widely scattered cortical and subcortical restricted diffusion in the right hemisphere, with confluent areas of involvement both in the right posterior temporal/aparietal lobe, and also the right anterior corona radiata (series 3 image 37). All of the diffusion abnormality seems limited to the right MCA territory. Both axial and coronal DWI is motion degraded but there is no definite left hemisphere or posterior fossa diffusion restriction. T2 and FLAIR hyperintensity in keeping with cytotoxic edema in the widely scattered areas of acute involvement. No associated hemorrhage or mass effect. Superimposed chronic right temporal lobe region CSF cyst which is stable to mildly smaller since 2016. No superimposed midline shift, ventriculomegaly, or acute intracranial hemorrhage. Cervicomedullary junction and pituitary are within normal limits. Chronic encephalomalacia in the left occipital pole is new since 2016. And there are also several small chronic right cerebellar infarcts new from the prior MRI, largest in the SCA territory. There  is chronic patchy bilateral white matter T2 and FLAIR hyperintensity which is largely stable. T2 heterogeneity in the deep gray nuclei and pons has not significantly changed. Vascular: Major intracranial vascular flow voids are stable since 2016. The right vertebral artery appears dominant. Skull and upper cervical spine: Upper cervical spine degeneration appears increased since 2016 with up to mild spinal stenosis. Visualized bone marrow signal is within normal limits. Sinuses/Orbits: No acute orbit or paranasal sinus findings. Other: Mastoids are clear. Grossly normal visible internal auditory structures. MRA HEAD FINDINGS Antegrade flow in the posterior circulation. Both distal vertebral arteries appear to remain patent although the right is dominant. Normal right PICA origin. No definite distal vertebral stenosis. Patent vertebrobasilar junction. Patent basilar artery with mild mid basilar stenosis. SCA and right PCA origins are patent. A left posterior communicating artery is present but the left PCA is occluded. Right PCA is mildly irregular but patent. Asymmetric decreased antegrade flow signal in the right ICA siphon compared to the left. Probably artifactual loss of signal in the anterior genu. The distal ICA is patent. Patent left siphon with no definite left ICA stenosis. Patent left ICA terminus. Dominant left and diminutive or absent right ACA A1 segments such that the  right ACA is supplied from the left. Normal anterior communicating artery. Visible ACA branches are within normal limits. Left MCA M1 segment bifurcates early. The visible left MCA branches are patent with no high-grade stenosis identified. The diminished right ICA terminates in the MCA. The right M1 and the MCA bifurcation are patent but with decreased flow. IMPRESSION: 1. Positive for widely scattered acute to subacute infarcts in the Right MCA territory with corresponding decreased flow in the Right ICA such as due to high-grade  carotid stenosis in the neck. The right M1 and MCA bifurcation appear to remain patent. 2. Scattered infarct related cytotoxic edema in the right MCA territory with no associated hemorrhage or mass effect. 3. 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 Several chronic right cerebellar infarcts are also new since that time. 4. Chronic benign right temporal lobe CSF cyst. Chronic small vessel disease. Electronically Signed: By: Genevie Ann M.D. On: 11/03/2019 20:32   DG Chest Portable 1 View  Result Date: 11/03/2019 CLINICAL DATA:  Weakness and decreased oral intake EXAM: PORTABLE CHEST 1 VIEW COMPARISON:  07/04/2013 FINDINGS: The heart size and mediastinal contours are within normal limits. Both lungs are clear. The visualized skeletal structures are unremarkable. IMPRESSION: No active disease. Electronically Signed   By: Ulyses Jarred M.D.   On: 11/03/2019 18:55   ECHOCARDIOGRAM COMPLETE  Result Date: 11/04/2019    ECHOCARDIOGRAM REPORT   Patient Name:   TAQUILLA DOWNUM [facility] Date of Exam: 11/04/2019 Medical Rec #:  [id number]         Height:       66.0 in Accession #:    [id number]        Weight:       140.0 lb Date of Birth:  [birth year]         BSA:          1.72 m Patient Age:    34 years          BP:           142/63 mmHg Patient Gender: F                 HR:           62 bpm. Exam Location:  [facility] Procedure: 2D Echo Indications:    Stroke 434.91 / I163.9  History:        Patient has no prior history of Echocardiogram examinations.                 Stroke; Risk Factors:Hypertension, Diabetes, Dyslipidemia and                 Former Smoker. Cancer.  Sonographer:    Leavy Cella RDCS (AE) Referring Phys: [place]  1. Left ventricular ejection fraction, by estimation, is 60 to 65%. The left ventricle has normal function. The left ventricle has no regional wall motion abnormalities. There is  mild left ventricular hypertrophy. Left ventricular diastolic parameters are consistent with Grade I diastolic dysfunction (impaired relaxation).  2. Right ventricular systolic function is normal. The right ventricular size is normal. There is normal pulmonary artery systolic pressure. The estimated right ventricular systolic pressure is 95.2 mmHg.  3. Left atrial size was mildly dilated.  4. The mitral valve is grossly normal. Trivial mitral valve regurgitation.  5. The aortic valve is tricuspid. Aortic valve regurgitation is not visualized.  6. The inferior vena  cava is normal in size with greater than 50% respiratory variability, suggesting right atrial pressure of 3 mmHg. FINDINGS  Left Ventricle: Left ventricular ejection fraction, by estimation, is 60 to 65%. The left ventricle has normal function. The left ventricle has no regional wall motion abnormalities. The left ventricular internal cavity size was normal in size. There is  mild left ventricular hypertrophy. Left ventricular diastolic parameters are consistent with Grade I diastolic dysfunction (impaired relaxation). Right Ventricle: The right ventricular size is normal. No increase in right ventricular wall thickness. Right ventricular systolic function is normal. There is normal pulmonary artery systolic pressure. The tricuspid regurgitant velocity is 2.45 m/s, and  with an assumed right atrial pressure of 3 mmHg, the estimated right ventricular systolic pressure is 81.1 mmHg. Left Atrium: Left atrial size was mildly dilated. Right Atrium: Right atrial size was normal in size. Pericardium: There is no evidence of pericardial effusion. Presence of pericardial fat pad. Mitral Valve: The mitral valve is grossly normal. Mild mitral annular calcification. Trivial mitral valve regurgitation. Tricuspid Valve: The tricuspid valve is grossly normal. Tricuspid valve regurgitation is trivial. Aortic Valve: The aortic valve is tricuspid. Aortic valve regurgitation  is not visualized. Pulmonic Valve: The pulmonic valve was grossly normal. Pulmonic valve regurgitation is trivial. Aorta: The aortic root is normal in size and structure. Venous: The inferior vena cava is normal in size with greater than 50% respiratory variability, suggesting right atrial pressure of 3 mmHg. IAS/Shunts: No atrial level shunt detected by color flow Doppler.  LEFT VENTRICLE PLAX 2D LVIDd:         3.77 cm  Diastology LVIDs:         2.60 cm  LV e' lateral:   7.72 cm/s LV PW:         1.09 cm  LV E/e' lateral: 12.4 LV IVS:        1.20 cm  LV e' medial:    6.96 cm/s LVOT diam:     1.80 cm  LV E/e' medial:  13.7 LV SV Index:   21.00 LVOT Area:     2.54 cm  RIGHT VENTRICLE RV S prime:     10.10 cm/s TAPSE (M-mode): 2.0 cm LEFT ATRIUM             Index       RIGHT ATRIUM           Index LA diam:        3.20 cm 1.86 cm/m  RA Area:     10.80 cm LA Vol (A2C):   32.0 ml 18.62 ml/m RA Volume:   20.20 ml  11.75 ml/m LA Vol (A4C):   63.1 ml 36.72 ml/m LA Biplane Vol: 45.5 ml 26.48 ml/m   AORTA Ao Root diam: 2.40 cm MITRAL VALVE                TRICUSPID VALVE MV Area (PHT): 2.24 cm     TR Peak grad:   24.0 mmHg MV Decel Time: 338 msec     TR Vmax:        245.00 cm/s MV E velocity: 95.60 cm/s MV A velocity: 122.00 cm/s  SHUNTS MV E/A ratio:  0.78         Systemic Diam: 1.80 cm Rozann Lesches MD Electronically signed by Rozann Lesches MD Signature Date/Time: 11/04/2019/11:45:00 AM    Final      CBC Recent Labs  Lab 11/03/19 1736  WBC 8.3  HGB 11.2*  HCT 36.4  PLT  318  MCV 96.3  MCH 29.6  MCHC 30.8  RDW 11.6  LYMPHSABS 1.9  MONOABS 0.8  EOSABS 0.0  BASOSABS 0.0    Chemistries  Recent Labs  Lab 11/03/19 1736 11/04/19 0544  NA 143 145  K 4.8 4.6  CL 108 110  CO2 24 24  GLUCOSE 123* 101*  BUN 48* 48*  CREATININE 2.89* 2.94*  CALCIUM 9.9 9.4  AST 22  --   ALT 21  --   ALKPHOS 59  --   BILITOT 0.7  --     ------------------------------------------------------------------------------------------------------------------ Recent Labs    11/04/19 0544  CHOL 121  HDL 36*  LDLCALC 44  TRIG 203*  CHOLHDL 3.4    Lab Results  Component Value Date   HGBA1C 7.5 (H) 11/03/2019   ------------------------------------------------------------------------------------------------------------------ No results for input(s): TSH, T4TOTAL, T3FREE, THYROIDAB in the last 72 hours.  Invalid input(s): FREET3 ------------------------------------------------------------------------------------------------------------------ No results for input(s): VITAMINB12, FOLATE, FERRITIN, TIBC, IRON, RETICCTPCT in the last 72 hours.  Coagulation profile No results for input(s): INR, PROTIME in the last 168 hours.  No results for input(s): DDIMER in the last 72 hours.  Cardiac Enzymes No results for input(s): CKMB, TROPONINI, MYOGLOBIN in the last 168 hours.  Invalid input(s): CK ------------------------------------------------------------------------------------------------------------------ No results found for: BNP   Roxan Hockey M.D on 11/04/2019 at 12:15 PM  Go to [email or web address] - for contact info  [facility] - Office  [phone]

## 2019-11-04 NOTE — Progress Notes (Signed)
*  PRELIMINARY RESULTS* Echocardiogram 2D Echocardiogram has been performed.  Colleen Parks 11/04/2019, 11:00 AM

## 2019-11-04 NOTE — ED Notes (Signed)
[name] ( daughter) notified via phone to give permission to go to [facility]. Verified approval by 2 nurse.

## 2019-11-04 NOTE — Progress Notes (Signed)
Colleen Parks is a 85 y.o. female patient admitted from ED awake, alert - oriented  X 4 - no acute distress noted.  VSS - Blood pressure (!) 144/53, pulse 66, temperature 98.1 F (36.7 C), temperature source Oral, resp. rate 18, height 5\' 6"  (1.676 m), weight 62.5 kg, SpO2 97 %.    IV in place, occlusive dsg intact without redness.  Orientation to room, and floor completed with information packet given to patient/family.  Patient declined safety video at this time.  Admission INP armband ID verified with patient and in place.   SR up x 2, fall assessment complete, with patient able to verbalize understanding of risk associated with falls, and verbalized understanding to call nsg before up out of bed.  Call light within reach, patient able to voice, and demonstrate understanding.  Skin, clean-dry- intact without evidence of bruising, or skin tears.   No evidence of skin break down noted on exam.     Will cont to eval and treat per MD orders.  [place], RN 11/04/2019 11:31 PM

## 2019-11-04 NOTE — ED Notes (Signed)
Echo being completed

## 2019-11-04 NOTE — ED Notes (Signed)
CBG: 115 

## 2019-11-05 ENCOUNTER — Inpatient Hospital Stay (HOSPITAL_COMMUNITY): Payer: PPO

## 2019-11-05 DIAGNOSIS — L8990 Pressure ulcer of unspecified site, unspecified stage: Secondary | ICD-10-CM | POA: Insufficient documentation

## 2019-11-05 DIAGNOSIS — I6521 Occlusion and stenosis of right carotid artery: Secondary | ICD-10-CM

## 2019-11-05 DIAGNOSIS — I639 Cerebral infarction, unspecified: Secondary | ICD-10-CM

## 2019-11-05 DIAGNOSIS — I739 Peripheral vascular disease, unspecified: Secondary | ICD-10-CM

## 2019-11-05 DIAGNOSIS — U071 COVID-19: Secondary | ICD-10-CM

## 2019-11-05 DIAGNOSIS — N184 Chronic kidney disease, stage 4 (severe): Secondary | ICD-10-CM

## 2019-11-05 DIAGNOSIS — I10 Essential (primary) hypertension: Secondary | ICD-10-CM

## 2019-11-05 LAB — COMPREHENSIVE METABOLIC PANEL
ALT: 17 U/L (ref 0–44)
AST: 19 U/L (ref 15–41)
Albumin: 3 g/dL — ABNORMAL LOW (ref 3.5–5.0)
Alkaline Phosphatase: 55 U/L (ref 38–126)
Anion gap: 11 (ref 5–15)
BUN: 39 mg/dL — ABNORMAL HIGH (ref 8–23)
CO2: 23 mmol/L (ref 22–32)
Calcium: 9.7 mg/dL (ref 8.9–10.3)
Chloride: 113 mmol/L — ABNORMAL HIGH (ref 98–111)
Creatinine, Ser: 2.56 mg/dL — ABNORMAL HIGH (ref 0.44–1.00)
GFR calc Af Amer: 20 mL/min — ABNORMAL LOW (ref >60)
GFR calc non Af Amer: 17 mL/min — ABNORMAL LOW (ref >60)
Glucose, Bld: 110 mg/dL — ABNORMAL HIGH (ref 70–99)
Potassium: 4.5 mmol/L (ref 3.5–5.1)
Sodium: 147 mmol/L — ABNORMAL HIGH (ref 135–145)
Total Bilirubin: 0.6 mg/dL (ref 0.3–1.2)
Total Protein: 6.9 g/dL (ref 6.5–8.1)

## 2019-11-05 LAB — D-DIMER, QUANTITATIVE: D-Dimer, Quant: 2.12 ug/mL-FEU — ABNORMAL HIGH (ref 0.00–0.50)

## 2019-11-05 LAB — CBC WITH DIFFERENTIAL/PLATELET
Abs Immature Granulocytes: 0.04 10*3/uL (ref 0.00–0.07)
Basophils Absolute: 0 10*3/uL (ref 0.0–0.1)
Basophils Relative: 0 %
Eosinophils Absolute: 0.1 10*3/uL (ref 0.0–0.5)
Eosinophils Relative: 1 %
HCT: 33.3 % — ABNORMAL LOW (ref 36.0–46.0)
Hemoglobin: 10.4 g/dL — ABNORMAL LOW (ref 12.0–15.0)
Immature Granulocytes: 1 %
Lymphocytes Relative: 24 %
Lymphs Abs: 1.9 10*3/uL (ref 0.7–4.0)
MCH: 29.4 pg (ref 26.0–34.0)
MCHC: 31.2 g/dL (ref 30.0–36.0)
MCV: 94.1 fL (ref 80.0–100.0)
Monocytes Absolute: 0.7 10*3/uL (ref 0.1–1.0)
Monocytes Relative: 9 %
Neutro Abs: 5.3 10*3/uL (ref 1.7–7.7)
Neutrophils Relative %: 65 %
Platelets: 285 10*3/uL (ref 150–400)
RBC: 3.54 MIL/uL — ABNORMAL LOW (ref 3.87–5.11)
RDW: 11.5 % (ref 11.5–15.5)
WBC: 8.1 10*3/uL (ref 4.0–10.5)
nRBC: 0 % (ref 0.0–0.2)

## 2019-11-05 LAB — GLUCOSE, CAPILLARY
Glucose-Capillary: 113 mg/dL — ABNORMAL HIGH (ref 70–99)
Glucose-Capillary: 123 mg/dL — ABNORMAL HIGH (ref 70–99)
Glucose-Capillary: 108 mg/dL — ABNORMAL HIGH (ref 70–99)
Glucose-Capillary: 151 mg/dL — ABNORMAL HIGH (ref 70–99)

## 2019-11-05 LAB — C-REACTIVE PROTEIN: CRP: 2.3 mg/dL — ABNORMAL HIGH (ref <1.0)

## 2019-11-05 LAB — FERRITIN: Ferritin: 278 ng/mL (ref 11–307)

## 2019-11-05 NOTE — H&P (View-Only) (Signed)
ASSESSMENT & PLAN   SYMPTOMATIC RIGHT CAROTID STENOSIS: This patient has had very tight 90% or greater right carotid stenosis.  Peak systolic velocity is 324 cm/s.  This is symptomatic and she has had multiple scattered infarcts in the right MCA distribution.  She is still somewhat confused but I did speak with her daughter Colleen Parks.  I have explained that I would recommend right carotid endarterectomy in order to lower her risk of future stroke.  Risk of stroke without surgery is approximately 10 %/year.  I have explained that the risks associated with surgery include a 1 to 2% perioperative risk of stroke, MI, nerve injury, or other unpredictable medical problems.  The daughter would like [place] to proceed I would discuss this with the patient again tomorrow in hopes that she understands.  She is on aspirin but is allergic to statins.  THE PATIENT IS COVID POSITIVE.  REASON FOR CONSULT:    Symptomatic right carotid stenosis.  The consult is requested by Dr. Sloan Leiter.  HPI:   Colleen Parks is a 85 y.o. female who was admitted on 11/02/2018 with confusion, weakness, and poor p.o. intake.  She is confused and it is difficult to obtain a reliable history from the patient.  According to the records she presented with progressive generalized weakness and confusion which had been going on for 7 days.  The symptoms began before the patient had received her COVID-19 vaccine which was about a week prior to admission.  Her work-up included a CT of the head and MRI of the brain which showed scattered acute and subacute infarcts in the right parietal lobe.  Carotid duplex scan showed a high-grade right carotid stenosis and for this reason vascular surgery was consulted.  The patient has a history of stage IV chronic kidney disease and for this reason a CT angiogram of the neck was not obtained.  I have previously performed a left carotid endarterectomy on the patient in March 2018.  I last saw her in the  office on 03/16/2019.  Her carotid duplex scan at that time showed a widely patent left carotid endarterectomy site with a stable 40 to 59% right carotid stenosis.  This had not changed in a year.  Her carotid duplex follow-ups were being done by Dr. Johnsie Cancel.  When I saw her last she had stopped taking her aspirin because she had been bruising I encouraged her to resume taking her aspirin.  She was on something other than a statin for her cholesterol as she said she did not tolerate statins.  She also had a history of peripheral vascular disease.  At the time of her last visit she did admit to calf claudication bilaterally.  She did not have any history of rest pain or nonhealing ulcers. Arterial Doppler study at the time of her last admission showed an ABI of 58% on the right and 70% on the left.  A duplex scan earlier in 2020 showed evidence of infrainguinal arterial occlusive disease bilaterally.  Past Medical History:  Diagnosis Date  . 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 Anxiety   . Cancer [facility])     left breast mastectomy  . Diabetes mellitus    no longer on medication  . GERD (gastroesophageal reflux disease)   . Glomerulonephritis   . HTN (hypertension)   . Hypercholesterolemia   . RAS (renal artery stenosis) ([place])   . Sleep apnea    no cpap use  . Subclavian arterial stenosis ([facility])  Family History  Problem Relation Age of Onset  . Coronary artery disease Other        positive for premature- on fathers side  . Anesthesia problems Neg Hx   . Hypotension Neg Hx   . Malignant hyperthermia Neg Hx   . Pseudochol deficiency Neg Hx     SOCIAL HISTORY: Social History   Tobacco Use  . Smoking status: Former Smoker    Packs/day: 0.50    Years: 4.00    Pack years: 2.00    Types: Cigarettes    Quit date: 12/09/1958    Years since quitting: 60.9  . Smokeless tobacco: Never Used  . Tobacco comment: non-smoker  Substance Use Topics  . Alcohol use: No    Alcohol/week: 0.0  standard drinks    Allergies  Allergen Reactions  . Codeine Nausea And Vomiting  . Ezetimibe Other (See Comments)    Myalgias  . Metformin And Related Other (See Comments)    Had kidney problems and nephrologist told her never to take it again  . Statins Other (See Comments)    Myalgias with rosuvastatin 5mg  daily, lovastatin 20mg  daily, pravastatin 80mg  daily    Current Facility-Administered Medications  Medication Dose Route Frequency Provider Last Rate Last Admin  . acetaminophen (TYLENOL) tablet 650 mg  650 mg Oral Q4H PRN Emokpae, Ejiroghene E, MD       Or  . acetaminophen (TYLENOL) 160 MG/5ML solution 650 mg  650 mg Per Tube Q4H PRN Emokpae, Ejiroghene E, MD       Or  . acetaminophen (TYLENOL) suppository 650 mg  650 mg Rectal Q4H PRN Emokpae, Ejiroghene E, MD      . ascorbic acid (VITAMIN C) tablet 500 mg  500 mg Oral Daily Emokpae, Courage, MD   500 mg at 11/05/19 0929  . aspirin tablet 325 mg  325 mg Oral Daily Roxan Hockey, MD   325 mg at 11/05/19 0929  . chlorpheniramine-HYDROcodone (TUSSIONEX) 10-8 MG/5ML suspension 5 mL  5 mL Oral Q12H PRN Emokpae, Courage, MD      . fenofibrate tablet 54 mg  54 mg Oral Daily Emokpae, Ejiroghene E, MD   54 mg at 00/93/81 8299  . folic acid (FOLVITE) tablet 1 mg  1 mg Oral Daily Emokpae, Courage, MD   1 mg at 11/05/19 0929  . guaiFENesin-dextromethorphan (ROBITUSSIN DM) 100-10 MG/5ML syrup 10 mL  10 mL Oral Q4H PRN Emokpae, Courage, MD      . heparin injection 5,000 Units  5,000 Units Subcutaneous Q8H Emokpae, Courage, MD   5,000 Units at 11/05/19 1242  . insulin aspart (novoLOG) injection 0-6 Units  0-6 Units Subcutaneous TID WC Emokpae, Courage, MD      . multivitamin with minerals tablet 1 tablet  1 tablet Oral Daily Roxan Hockey, MD   1 tablet at 11/05/19 0929  . senna-docusate (Senokot-S) tablet 1 tablet  1 tablet Oral QHS PRN Emokpae, Ejiroghene E, MD      . thiamine tablet 100 mg  100 mg Oral Daily Emokpae, Courage, MD   100  mg at 11/05/19 0929  . zinc sulfate capsule 220 mg  220 mg Oral Daily Emokpae, Courage, MD   220 mg at 11/05/19 3716    REVIEW OF SYSTEMS: The patient is confused and I cannot obtain a reliable review of systems.  PHYSICAL EXAM:   Vitals:   11/05/19 0436 11/05/19 0800 11/05/19 1200 11/05/19 1355  BP: (!) 144/59 (!) 132/45 (!) 150/54 133/70  Pulse: 62  68  Resp: (!) 21     Temp: 99.2 F (37.3 C) 98.1 F (36.7 C)  98.5 F (36.9 C)  TempSrc: Axillary Oral  Oral  SpO2: 99% 96% 97% 98%  Weight:      Height:       Body mass index is 22.24 kg/m.   GENERAL: The patient is a well-nourished female, in no acute distress. The vital signs are documented above. CARDIAC: There is a regular rate and rhythm.  VASCULAR: I do not detect carotid bruits with the stethoscope it is available for isolation patients. She has palpable femoral pulses.  I could not palpate pedal pulses however both feet were warm and well perfused. PULMONARY: There is good air exchange bilaterally without wheezing or rales. ABDOMEN: Soft and non-tender with normal pitched bowel sounds.  I do not palpate an abdominal aortic aneurysm. MUSCULOSKELETAL: There are no major deformities. NEUROLOGIC: On my exam she had good strength in her upper extremities and lower extremities bilaterally.  She was confused and it was difficult to obtain a history from the patient. SKIN: There are no ulcers or rashes noted. PSYCHIATRIC: The patient has a normal affect.  DATA:    Lab Results  Component Value Date   WBC 8.1 11/05/2019   HGB 10.4 (L) 11/05/2019   HCT 33.3 (L) 11/05/2019   MCV 94.1 11/05/2019   PLT 285 11/05/2019   Lab Results  Component Value Date   NA 147 (H) 11/05/2019   K 4.5 11/05/2019   CL 113 (H) 11/05/2019   CO2 23 11/05/2019   Lab Results  Component Value Date   CREATININE 2.56 (H) 11/05/2019   Lab Results  Component Value Date   INR 0.94 11/12/2016   INR 0.90 11/15/2013   INR 0.97 06/30/2013   Lab  Results  Component Value Date   HGBA1C 7.5 (H) 11/03/2019   CBG (last 3)  Recent Labs    11/04/19 2038 11/05/19 0836 11/05/19 1212  GLUCAP 115* 113* 123*   MRI: Her MRI shows widely scattered acute to subacute infarcts in the right MCA territory with evidence of decreased flow in the right ICA suggesting a high-grade proximal carotid stenosis.  CT HEAD WITHOUT CONTRAST: CT of the head on 11/03/2019 showed an area of hypoattenuation in the right parietal lobe concerning for acute to early subacute infarct.  There was no intracranial hemorrhage.  She had old right temporal lobe infarcts and findings of chronic microvascular ischemia.  CAROTID DUPLEX: I have independently interpreted her carotid duplex scan.    On the right,  she has a very tight internal carotid carotid artery stenosis of greater than 80%.  Peak systolic velocity 294 cm/s with end-diastolic velocity 765 cm/s.  The right vertebral artery is patent with antegrade flow.  On the right, the internal carotid artery appeared normal beyond the stenosis and the bifurcation did not appear to be high.  On the left side her carotid endarterectomy site is widely patent.  The left vertebral artery is patent with antegrade flow.  LABS: Coronavirus test is positive.  Her GFR is 17.  Creatinine 2.56.  [name] [facility] [place]: [phone] Office: [phone]

## 2019-11-05 NOTE — Progress Notes (Addendum)
PROGRESS NOTE                                                                                                                                                                                                             Patient Demographics:    Colleen Parks, is a 85 y.o. female, DOB - 03/25/1935, TIR:[id number]  Outpatient Primary MD for the patient is Lemmie Evens, MD   Admit date - 11/03/2019   LOS - 2  Chief Complaint  Patient presents with  . Weakness       Brief Narrative: Patient is a 85 y.o. 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  [facility] neurology evaluation.  See below for further details.   Subjective:    Colleen Parks today is awake-mostly alert.  Appears to have some mild neglect of the left side.   Assessment  & Plan :   Acute Right MCA stroke: Setting of right ICA high-grade stenosis-has slight weakness in left upper extremity compared to left lower extremity.  A1c 7.5, LDL 44, echo without any embolic source-telemetry negative for A. fib.  Continue aspirin-await further recommendations from neurology.  COVID-19 infection: Appears asymptomatic-follow inflammatory markers.  Chest x-ray without pneumonia.  On room air this morning.  Fever: afebrile  O2 requirements:  SpO2: 96 %   COVID-19 Labs: Recent Labs    11/04/19 1451  DDIMER 2.95*  FERRITIN 253  LDH 151  CRP 2.7*    No results found for: BNP  No results for input(s): PROCALCITON in the last 168 hours.  Lab Results  Component Value Date   SARSCOV2NAA POSITIVE (A) 11/03/2019     COVID-19 Medications: None  DVT Prophylaxis  :  Heparin  Mildly elevated D-dimer: Not hypoxic-lower extremity Doppler  negative-doubt requires any further work-up in the absence of hypoxemia.  AKI on CKD stage IV: Euvolemic on exam-supportive care for now-avoid nephrotoxic agents-await repeat electrolytes this morning.  Acute metabolic encephalopathy: In the setting of CVA-seems reasonably awake and alert this morning-supportive care.  History of left carotid stenosis s/p left CEA in 2018  History of ANCA/glomerulonephritis: Not on any immunosuppressive's for the past several years-follows with nephrology in the  outpatient setting.  HLD: On Repatha infusion as outpatient-on fenofibrate as well.  LDL as above.  HTN: Hold all antihypertensives-allow permissive hypertension in the setting of CVA with high-grade ICA stenosis.  DM-2 (A1c 7.5): CBG stable with SSI  CBG (last 3)  Recent Labs    11/04/19 0758 11/04/19 2038 11/05/19 0836  GLUCAP 94 115* 113*       RN pressure injury documentation: Pressure Injury 11/04/19 Sacrum Right;Left Stage 1 -  Intact skin with non-blanchable redness of a localized area usually over a bony prominence. (Active)  11/04/19 2311  Location: Sacrum  Location Orientation: Right;Left  Staging: Stage 1 -  Intact skin with non-blanchable redness of a localized area usually over a bony prominence.  Wound Description (Comments):   Present on Admission: Yes    Consults  : Neurology  Procedures  :  None  ABG: No results found for: PHART, PCO2ART, PO2ART, HCO3, TCO2, ACIDBASEDEF, O2SAT  Vent Settings: N/A  Condition -  Guarded  Family Communication  :  Daughter updated over the phone  Code Status :  Full Code  Diet :  Diet Order            Diet full liquid Room service appropriate? Yes; Fluid consistency: Thin  Diet effective now               Disposition Plan  :  Remain hospitalized-probably either SNF versus home health services-depending on clinical trajectory.  Barriers to discharge: CVA work-up/neurology evaluation in progress  Antimicorbials  :     Anti-infectives (From admission, onward)   None      Inpatient Medications  Scheduled Meds: . vitamin C  500 mg Oral Daily  . aspirin  325 mg Oral Daily  . fenofibrate  54 mg Oral Daily  . folic acid  1 mg Oral Daily  . heparin injection (subcutaneous)  5,000 Units Subcutaneous Q8H  . insulin aspart  0-6 Units Subcutaneous TID WC  . multivitamin with minerals  1 tablet Oral Daily  . thiamine  100 mg Oral Daily  . zinc sulfate  220 mg Oral Daily   Continuous Infusions: PRN Meds:.acetaminophen **OR** acetaminophen (TYLENOL) oral liquid 160 mg/5 mL **OR** acetaminophen, chlorpheniramine-HYDROcodone, guaiFENesin-dextromethorphan, senna-docusate   Time Spent in minutes  25  See all Orders from today for further details   Oren Binet M.D on 11/05/2019 at 9:43 AM  To page go to [email or web address] - use universal password  [facility] -  Office  [phone]    Objective:   Vitals:   11/04/19 2213 11/05/19 0016 11/05/19 0436 11/05/19 0800  BP: (!) 144/53 (!) 136/49 (!) 144/59 (!) 132/45  Pulse: 66 61 62   Resp: 18 20 (!) 21   Temp: 98.1 F (36.7 C) 98.4 F (36.9 C) 99.2 F (37.3 C) 98.1 F (36.7 C)  TempSrc: Oral Oral Axillary Oral  SpO2: 97% 95% 99% 96%  Weight: 62.5 kg     Height:        Wt Readings from Last 3 Encounters:  11/04/19 62.5 kg  03/16/19 64 kg  11/17/18 65.2 kg     Intake/Output Summary (Last 24 hours) at 11/05/2019 1779 Last data filed at 11/04/2019 2010 Gross per 24 hour  Intake 1000 ml  Output --  Net 1000 ml     Physical Exam Gen Exam:Alert awake-not in any distress HEENT:atraumatic, normocephalic Chest: B/L clear to auscultation anteriorly CVS:S1S2 regular Abdomen:soft non tender, non distended Extremities:no edema Neurology: Mild left hemiparesis (left upper extremity>>  left lower extremity) Skin: no rash   Data Review:    CBC Recent Labs  Lab 11/03/19 1736  WBC 8.3  HGB 11.2*  HCT 36.4  PLT 318  MCV 96.3   MCH 29.6  MCHC 30.8  RDW 11.6  LYMPHSABS 1.9  MONOABS 0.8  EOSABS 0.0  BASOSABS 0.0    Chemistries  Recent Labs  Lab 11/03/19 1736 11/04/19 0544  NA 143 145  K 4.8 4.6  CL 108 110  CO2 24 24  GLUCOSE 123* 101*  BUN 48* 48*  CREATININE 2.89* 2.94*  CALCIUM 9.9 9.4  AST 22  --   ALT 21  --   ALKPHOS 59  --   BILITOT 0.7  --    ------------------------------------------------------------------------------------------------------------------ Recent Labs    11/04/19 0544  CHOL 121  HDL 36*  LDLCALC 44  TRIG 203*  CHOLHDL 3.4    Lab Results  Component Value Date   HGBA1C 7.5 (H) 11/03/2019   ------------------------------------------------------------------------------------------------------------------ No results for input(s): TSH, T4TOTAL, T3FREE, THYROIDAB in the last 72 hours.  Invalid input(s): FREET3 ------------------------------------------------------------------------------------------------------------------ Recent Labs    11/04/19 1451  FERRITIN 253    Coagulation profile No results for input(s): INR, PROTIME in the last 168 hours.  Recent Labs    11/04/19 1451  DDIMER 2.95*    Cardiac Enzymes No results for input(s): CKMB, TROPONINI, MYOGLOBIN in the last 168 hours.  Invalid input(s): CK ------------------------------------------------------------------------------------------------------------------ No results found for: BNP  Micro Results Recent Results (from the past 240 hour(s))  SARS CORONAVIRUS 2 (TAT 6-24 HRS) Nasopharyngeal Nasopharyngeal Swab     Status: Abnormal   Collection Time: 11/03/19  9:22 PM   Specimen: Nasopharyngeal Swab  Result Value Ref Range Status   SARS Coronavirus 2 POSITIVE (A) NEGATIVE Final    Comment: RESULT CALLED TO, READ BACK BY AND VERIFIED WITH: Jani Gravel RN 14:15 11/04/19 ([email or web address]) (NOTE) SARS-CoV-2 target nucleic acids are DETECTED. The SARS-CoV-2 RNA is generally detectable in upper and  lower respiratory specimens during the acute phase of infection. Positive results are indicative of the presence of SARS-CoV-2 RNA. Clinical correlation with patient history and other diagnostic information is  necessary to determine patient infection status. Positive results do not rule out bacterial infection or co-infection with other viruses.  The expected result is Negative. Fact Sheet for Patients: SugarRoll.be Fact Sheet for Healthcare Providers: https://www.woods-mathews.com/ This test is not yet approved or cleared by the [place] FDA and  has been authorized for detection and/or diagnosis of SARS-CoV-2 by FDA under an Emergency Use Authorization (EUA). This EUA will remain  in effect (meaning this test can be used) for th e duration of the COVID-19 declaration under Section 564(b)(1) of the Act, 21 U.S.C. section 360bbb-3(b)(1), unless the authorization is terminated or revoked sooner. Performed at [facility], [place] [address] [place], [place] [address]     Radiology Reports CT Head Wo Contrast  Result Date: 11/03/2019 CLINICAL DATA:  Ataxia and weakness EXAM: CT HEAD WITHOUT CONTRAST TECHNIQUE: Contiguous axial images were obtained from the base of the skull through the vertex without intravenous contrast. COMPARISON:  None. FINDINGS: Brain: There is an area of hypoattenuation with loss of gray-white differentiation in the right parietal lobe (series 2, image 17). No intracranial hemorrhage or other extra-axial collection. There is periventricular hypoattenuation compatible with chronic microvascular disease. Area of cystic encephalomalacia in the right temporal lobe. Vascular: No abnormal hyperdensity of the major intracranial arteries or dural venous sinuses. No intracranial atherosclerosis.  Skull: The visualized skull base, calvarium and extracranial soft tissues are normal. Sinuses/Orbits: No fluid levels or advanced mucosal  thickening of the visualized paranasal sinuses. No mastoid or middle ear effusion. The orbits are normal. IMPRESSION: 1. Area of hypoattenuation with loss of gray-white differentiation in the right parietal lobe, concerning for acute to early subacute infarct. MRI may provide better temporal characterization. 2. No intracranial hemorrhage. 3. Old right temporal lobe infarct and findings of chronic microvascular ischemia. Electronically Signed   By: Ulyses Jarred M.D.   On: 11/03/2019 18:51   MR ANGIO HEAD WO CONTRAST  Addendum Date: 11/03/2019   ADDENDUM REPORT: 11/03/2019 20:41 ADDENDUM: Study discussed by telephone with PA-C TAMMY TRIPLETT on 11/03/2019 at 20:38 hours. Electronically Signed   By: Genevie Ann M.D.   On: 11/03/2019 20:41   Result Date: 11/03/2019 CLINICAL DATA:  85 year old female with ataxia and weakness. Acute to subacute infarct in the right parietal lobe on head CT today. EXAM: MRI HEAD WITHOUT CONTRAST MRA HEAD WITHOUT CONTRAST TECHNIQUE: Multiplanar, multiecho pulse sequences of the brain and surrounding structures were obtained without intravenous contrast. Angiographic images of the head were obtained using MRA technique without contrast. COMPARISON:  Head CT 1829 hours today.  Brain MRI 01/31/2015. FINDINGS: MRI HEAD FINDINGS Brain: Study is intermittently degraded by motion artifact despite repeated imaging attempts. DWI reveals widely scattered cortical and subcortical restricted diffusion in the right hemisphere, with confluent areas of involvement both in the right posterior temporal/aparietal lobe, and also the right anterior corona radiata (series 3 image 37). All of the diffusion abnormality seems limited to the right MCA territory. Both axial and coronal DWI is motion degraded but there is no definite left hemisphere or posterior fossa diffusion restriction. T2 and FLAIR hyperintensity in keeping with cytotoxic edema in the widely scattered areas of acute involvement. No associated  hemorrhage or mass effect. Superimposed chronic right temporal lobe region CSF cyst which is stable to mildly smaller since 2016. No superimposed midline shift, ventriculomegaly, or acute intracranial hemorrhage. Cervicomedullary junction and pituitary are within normal limits. Chronic encephalomalacia in the left occipital pole is new since 2016. And there are also several small chronic right cerebellar infarcts new from the prior MRI, largest in the SCA territory. There is chronic patchy bilateral white matter T2 and FLAIR hyperintensity which is largely stable. T2 heterogeneity in the deep gray nuclei and pons has not significantly changed. Vascular: Major intracranial vascular flow voids are stable since 2016. The right vertebral artery appears dominant. Skull and upper cervical spine: Upper cervical spine degeneration appears increased since 2016 with up to mild spinal stenosis. Visualized bone marrow signal is within normal limits. Sinuses/Orbits: No acute orbit or paranasal sinus findings. Other: Mastoids are clear. Grossly normal visible internal auditory structures. MRA HEAD FINDINGS Antegrade flow in the posterior circulation. Both distal vertebral arteries appear to remain patent although the right is dominant. Normal right PICA origin. No definite distal vertebral stenosis. Patent vertebrobasilar junction. Patent basilar artery with mild mid basilar stenosis. SCA and right PCA origins are patent. A left posterior communicating artery is present but the left PCA is occluded. Right PCA is mildly irregular but patent. Asymmetric decreased antegrade flow signal in the right ICA siphon compared to the left. Probably artifactual loss of signal in the anterior genu. The distal ICA is patent. Patent left siphon with no definite left ICA stenosis. Patent left ICA terminus. Dominant left and diminutive or absent right ACA A1 segments such that the right ACA  is supplied from the left. Normal anterior communicating  artery. Visible ACA branches are within normal limits. Left MCA M1 segment bifurcates early. The visible left MCA branches are patent with no high-grade stenosis identified. The diminished right ICA terminates in the MCA. The right M1 and the MCA bifurcation are patent but with decreased flow. IMPRESSION: 1. Positive for widely scattered acute to subacute infarcts in the Right MCA territory with corresponding decreased flow in the Right ICA such as due to high-grade carotid stenosis in the neck. The right M1 and MCA bifurcation appear to remain patent. 2. Scattered infarct related cytotoxic edema in the right MCA territory with no associated hemorrhage or mass effect. 3. 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 Several chronic right cerebellar infarcts are also new since that time. 4. Chronic benign right temporal lobe CSF cyst. Chronic small vessel disease. Electronically Signed: By: Genevie Ann M.D. On: 11/03/2019 20:32   MR BRAIN WO CONTRAST  Addendum Date: 11/03/2019   ADDENDUM REPORT: 11/03/2019 20:41 ADDENDUM: Study discussed by telephone with PA-C TAMMY TRIPLETT on 11/03/2019 at 20:38 hours. Electronically Signed   By: Genevie Ann M.D.   On: 11/03/2019 20:41   Result Date: 11/03/2019 CLINICAL DATA:  85 year old female with ataxia and weakness. Acute to subacute infarct in the right parietal lobe on head CT today. EXAM: MRI HEAD WITHOUT CONTRAST MRA HEAD WITHOUT CONTRAST TECHNIQUE: Multiplanar, multiecho pulse sequences of the brain and surrounding structures were obtained without intravenous contrast. Angiographic images of the head were obtained using MRA technique without contrast. COMPARISON:  Head CT 1829 hours today.  Brain MRI 01/31/2015. FINDINGS: MRI HEAD FINDINGS Brain: Study is intermittently degraded by motion artifact despite repeated imaging attempts. DWI reveals widely scattered cortical and subcortical  restricted diffusion in the right hemisphere, with confluent areas of involvement both in the right posterior temporal/aparietal lobe, and also the right anterior corona radiata (series 3 image 37). All of the diffusion abnormality seems limited to the right MCA territory. Both axial and coronal DWI is motion degraded but there is no definite left hemisphere or posterior fossa diffusion restriction. T2 and FLAIR hyperintensity in keeping with cytotoxic edema in the widely scattered areas of acute involvement. No associated hemorrhage or mass effect. Superimposed chronic right temporal lobe region CSF cyst which is stable to mildly smaller since 2016. No superimposed midline shift, ventriculomegaly, or acute intracranial hemorrhage. Cervicomedullary junction and pituitary are within normal limits. Chronic encephalomalacia in the left occipital pole is new since 2016. And there are also several small chronic right cerebellar infarcts new from the prior MRI, largest in the SCA territory. There is chronic patchy bilateral white matter T2 and FLAIR hyperintensity which is largely stable. T2 heterogeneity in the deep gray nuclei and pons has not significantly changed. Vascular: Major intracranial vascular flow voids are stable since 2016. The right vertebral artery appears dominant. Skull and upper cervical spine: Upper cervical spine degeneration appears increased since 2016 with up to mild spinal stenosis. Visualized bone marrow signal is within normal limits. Sinuses/Orbits: No acute orbit or paranasal sinus findings. Other: Mastoids are clear. Grossly normal visible internal auditory structures. MRA HEAD FINDINGS Antegrade flow in the posterior circulation. Both distal vertebral arteries appear to remain patent although the right is dominant. Normal right PICA origin. No definite distal vertebral stenosis. Patent vertebrobasilar junction. Patent basilar artery with mild mid basilar stenosis. SCA and right PCA origins  are  patent. A left posterior communicating artery is present but the left PCA is occluded. Right PCA is mildly irregular but patent. Asymmetric decreased antegrade flow signal in the right ICA siphon compared to the left. Probably artifactual loss of signal in the anterior genu. The distal ICA is patent. Patent left siphon with no definite left ICA stenosis. Patent left ICA terminus. Dominant left and diminutive or absent right ACA A1 segments such that the right ACA is supplied from the left. Normal anterior communicating artery. Visible ACA branches are within normal limits. Left MCA M1 segment bifurcates early. The visible left MCA branches are patent with no high-grade stenosis identified. The diminished right ICA terminates in the MCA. The right M1 and the MCA bifurcation are patent but with decreased flow. IMPRESSION: 1. Positive for widely scattered acute to subacute infarcts in the Right MCA territory with corresponding decreased flow in the Right ICA such as due to high-grade carotid stenosis in the neck. The right M1 and MCA bifurcation appear to remain patent. 2. Scattered infarct related cytotoxic edema in the right MCA territory with no associated hemorrhage or mass effect. 3. 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 Several chronic right cerebellar infarcts are also new since that time. 4. Chronic benign right temporal lobe CSF cyst. Chronic small vessel disease. Electronically Signed: By: Genevie Ann M.D. On: 11/03/2019 20:32   US Venous Img Lower Bilateral (DVT)  Result Date: 11/04/2019 CLINICAL DATA:  85 year old female with recent multifocal right MCA territory infarct. Possible PE. EXAM: BILATERAL LOWER EXTREMITY VENOUS DOPPLER ULTRASOUND TECHNIQUE: Gray-scale sonography with graded compression, as well as color Doppler and duplex ultrasound were performed to evaluate the lower extremity deep venous systems  from the level of the common femoral vein and including the common femoral, femoral, profunda femoral, popliteal and calf veins including the posterior tibial, peroneal and gastrocnemius veins when visible. The superficial great saphenous vein was also interrogated. Spectral Doppler was utilized to evaluate flow at rest and with distal augmentation maneuvers in the common femoral, femoral and popliteal veins. COMPARISON:  None. FINDINGS: RIGHT LOWER EXTREMITY Common Femoral Vein: No evidence of thrombus. Normal compressibility, respiratory phasicity and response to augmentation. Saphenofemoral Junction: No evidence of thrombus. Normal compressibility and flow on color Doppler imaging. Profunda Femoral Vein: No evidence of thrombus. Normal compressibility and flow on color Doppler imaging. Femoral Vein: No evidence of thrombus. Normal compressibility, respiratory phasicity and response to augmentation. Popliteal Vein: No evidence of thrombus. Normal compressibility, respiratory phasicity and response to augmentation. Calf Veins: No evidence of thrombus. Normal compressibility and flow on color Doppler imaging. Other Findings:  None. LEFT LOWER EXTREMITY Common Femoral Vein: No evidence of thrombus. Normal compressibility, respiratory phasicity and response to augmentation. Saphenofemoral Junction: No evidence of thrombus. Normal compressibility and flow on color Doppler imaging. Profunda Femoral Vein: No evidence of thrombus. Normal compressibility and flow on color Doppler imaging. Femoral Vein: No evidence of thrombus. Normal compressibility, respiratory phasicity and response to augmentation. Popliteal Vein: No evidence of thrombus. Normal compressibility, respiratory phasicity and response to augmentation. Calf Veins: No evidence of thrombus. Normal compressibility and flow on color Doppler imaging. Other Findings:  None. IMPRESSION: No evidence of bilateral lower extremity deep venous thrombosis. Electronically  Signed   By: Genevie Ann M.D.   On: 11/04/2019 18:06   DG Chest Portable 1 View  Result Date: 11/03/2019 CLINICAL DATA:  Weakness and decreased oral intake EXAM:  PORTABLE CHEST 1 VIEW COMPARISON:  07/04/2013 FINDINGS: The heart size and mediastinal contours are within normal limits. Both lungs are clear. The visualized skeletal structures are unremarkable. IMPRESSION: No active disease. Electronically Signed   By: Ulyses Jarred M.D.   On: 11/03/2019 18:55   ECHOCARDIOGRAM COMPLETE  Result Date: 11/04/2019    ECHOCARDIOGRAM REPORT   Patient Name:   MYELLE POTEAT [facility] Date of Exam: 11/04/2019 Medical Rec #:  [id number]         Height:       66.0 in Accession #:    [id number]        Weight:       140.0 lb Date of Birth:  Mar 07, 1935         BSA:          1.72 m Patient Age:    83 years          BP:           142/63 mmHg Patient Gender: F                 HR:           62 bpm. Exam Location:  [facility] Procedure: 2D Echo Indications:    Stroke 434.91 / I163.9  History:        Patient has no prior history of Echocardiogram examinations.                 Stroke; Risk Factors:Hypertension, Diabetes, Dyslipidemia and                 Former Smoker. Cancer.  Sonographer:    Leavy Cella RDCS (AE) Referring Phys: [place]  1. Left ventricular ejection fraction, by estimation, is 60 to 65%. The left ventricle has normal function. The left ventricle has no regional wall motion abnormalities. There is mild left ventricular hypertrophy. Left ventricular diastolic parameters are consistent with Grade I diastolic dysfunction (impaired relaxation).  2. Right ventricular systolic function is normal. The right ventricular size is normal. There is normal pulmonary artery systolic pressure. The estimated right ventricular systolic pressure is 33.2 mmHg.  3. Left atrial size was mildly dilated.  4. The mitral valve is grossly normal. Trivial mitral valve regurgitation.  5. The aortic valve is tricuspid.  Aortic valve regurgitation is not visualized.  6. The inferior vena cava is normal in size with greater than 50% respiratory variability, suggesting right atrial pressure of 3 mmHg. FINDINGS  Left Ventricle: Left ventricular ejection fraction, by estimation, is 60 to 65%. The left ventricle has normal function. The left ventricle has no regional wall motion abnormalities. The left ventricular internal cavity size was normal in size. There is  mild left ventricular hypertrophy. Left ventricular diastolic parameters are consistent with Grade I diastolic dysfunction (impaired relaxation). Right Ventricle: The right ventricular size is normal. No increase in right ventricular wall thickness. Right ventricular systolic function is normal. There is normal pulmonary artery systolic pressure. The tricuspid regurgitant velocity is 2.45 m/s, and  with an assumed right atrial pressure of 3 mmHg, the estimated right ventricular systolic pressure is 95.1 mmHg. Left Atrium: Left atrial size was mildly dilated. Right Atrium: Right atrial size was normal in size. Pericardium: There is no evidence of pericardial effusion. Presence of pericardial fat pad. Mitral Valve: The mitral valve is grossly normal. Mild mitral annular calcification. Trivial mitral valve regurgitation. Tricuspid Valve: The tricuspid valve is grossly normal. Tricuspid valve regurgitation is trivial. Aortic Valve:  The aortic valve is tricuspid. Aortic valve regurgitation is not visualized. Pulmonic Valve: The pulmonic valve was grossly normal. Pulmonic valve regurgitation is trivial. Aorta: The aortic root is normal in size and structure. Venous: The inferior vena cava is normal in size with greater than 50% respiratory variability, suggesting right atrial pressure of 3 mmHg. IAS/Shunts: No atrial level shunt detected by color flow Doppler.  LEFT VENTRICLE PLAX 2D LVIDd:         3.77 cm  Diastology LVIDs:         2.60 cm  LV e' lateral:   7.72 cm/s LV PW:          1.09 cm  LV E/e' lateral: 12.4 LV IVS:        1.20 cm  LV e' medial:    6.96 cm/s LVOT diam:     1.80 cm  LV E/e' medial:  13.7 LV SV Index:   21.00 LVOT Area:     2.54 cm  RIGHT VENTRICLE RV S prime:     10.10 cm/s TAPSE (M-mode): 2.0 cm LEFT ATRIUM             Index       RIGHT ATRIUM           Index LA diam:        3.20 cm 1.86 cm/m  RA Area:     10.80 cm LA Vol (A2C):   32.0 ml 18.62 ml/m RA Volume:   20.20 ml  11.75 ml/m LA Vol (A4C):   63.1 ml 36.72 ml/m LA Biplane Vol: 45.5 ml 26.48 ml/m   AORTA Ao Root diam: 2.40 cm MITRAL VALVE                TRICUSPID VALVE MV Area (PHT): 2.24 cm     TR Peak grad:   24.0 mmHg MV Decel Time: 338 msec     TR Vmax:        245.00 cm/s MV E velocity: 95.60 cm/s MV A velocity: 122.00 cm/s  SHUNTS MV E/A ratio:  0.78         Systemic Diam: 1.80 cm Rozann Lesches MD Electronically signed by Rozann Lesches MD Signature Date/Time: 11/04/2019/11:45:00 AM    Final

## 2019-11-05 NOTE — Evaluation (Signed)
Physical Therapy Evaluation Patient Details Name: Colleen Parks MRN: [id number] DOB: 05/08/1935 Today's Date: 11/05/2019   History of Present Illness  85 y.o. 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 Pt presented to [facility] on 2/18 with generalized weakness, poor oral intake, confusion for 1 week. Work up at [facility] showed right MCA territory infarct-along with high-grade right ICA stenosis. She was then transferred from [facility] to [facility] for further neurologic care. MRI shows scattered subacute RMCA strokes. + COVID 19 test 11/03/19  Clinical Impression   Pt admitted with above diagnosis. Patient with left inattention noted throughout session, however this did not cause imbalance or running into objects while mobilizing in crowded room. Patient denied prior balance deficits, however scored 42/56 on Berg Balance Assessment (<45 essentially 100% risk of fall in next 6 mos). Per patient, she has 24/7 assistance by her husband and step-son. If this proves to be accurate, she would be safe to go home with their supervision/assistance with continued PT (HHPT). Pt currently with functional limitations due to the deficits listed below (see PT Problem List). Pt will benefit from skilled PT to increase their independence and safety with mobility to allow discharge to the venue listed below.       Follow Up Recommendations Home health PT;Supervision/Assistance - 24 hour(if family can provide 24/7 supervision)    Equipment Recommendations  None recommended by PT    Recommendations for Other Services OT consult;Speech consult     Precautions / Restrictions Precautions Precautions: Fall      Mobility  Bed Mobility Overal bed mobility: Needs Assistance Bed Mobility: Supine to Sit;Sit to Supine     Supine to sit: Supervision Sit to supine: Modified independent (Device/Increase time)   General bed mobility comments:  impulsively trying to get OOB several times during session (while MD assessing her)  Transfers Overall transfer level: Needs assistance Equipment used: None Transfers: Sit to/from Stand Sit to Stand: Min guard         General transfer comment: for safety; no imbalance noted  Ambulation/Gait Ambulation/Gait assistance: Min guard Gait Distance (Feet): 75 Feet(in her room) Assistive device: None Gait Pattern/deviations: Step-through pattern     General Gait Details: no imbalance or running into objects on her left; difficult to assess for drift in her room  Stairs            Wheelchair Mobility    Modified Rankin (Stroke Patients Only) Modified Rankin (Stroke Patients Only) Pre-Morbid Rankin Score: No symptoms Modified Rankin: Moderately severe disability     Balance                                 Standardized Balance Assessment Standardized Balance Assessment : Berg Balance Test Berg Balance Test Sit to Stand: Able to stand  independently using hands Standing Unsupported: Able to stand 2 minutes with supervision Sitting with Back Unsupported but Feet Supported on Floor or Stool: Able to sit safely and securely 2 minutes Stand to Sit: Sits safely with minimal use of hands Transfers: Able to transfer safely, minor use of hands Standing Unsupported with Eyes Closed: Able to stand 10 seconds with supervision Standing Ubsupported with Feet Together: Able to place feet together independently and stand for 1 minute with supervision From Standing, Reach Forward with Outstretched Arm: Can reach confidently >25 cm (10") From Standing Position, Pick up Object from Floor: Able  to pick up shoe safely and easily From Standing Position, Turn to Look Behind Over each Shoulder: Looks behind one side only/other side shows less weight shift Turn 360 Degrees: Able to turn 360 degrees safely but slowly Standing Unsupported, Alternately Place Feet on Step/Stool: Able to  complete >2 steps/needs minimal assist Standing Unsupported, One Foot in Front: Able to plae foot ahead of the other independently and hold 30 seconds Standing on One Leg: Tries to lift leg/unable to hold 3 seconds but remains standing independently Total Score: 42         Pertinent Vitals/Pain Pain Assessment: No/denies pain    Home Living Family/patient expects to be discharged to:: Private residence Living Arrangements: Spouse/significant other;Other relatives(step-son) Available Help at Discharge: Family Type of Home: House         Home Equipment: None Additional Comments: full info not obtained--session interrupted by surgeon    Prior Function Level of Independence: Independent         Comments: per patient, she still drives (?reliable)     Hand Dominance   Dominant Hand: Left    Extremity/Trunk Assessment   Upper Extremity Assessment Upper Extremity Assessment: Defer to OT evaluation    Lower Extremity Assessment Lower Extremity Assessment: Overall WFL for tasks assessed    Cervical / Trunk Assessment Cervical / Trunk Assessment: Other exceptions Cervical / Trunk Exceptions: keeps head rotated ~20 degrees to her right, she can turn head past midline to her left, but immendiately goes back to rest position with rt head turn  Communication   Communication: No difficulties  Cognition Arousal/Alertness: Awake/alert Behavior During Therapy: Restless;Impulsive Overall Cognitive Status: No family/caregiver present to determine baseline cognitive functioning                                 General Comments: a&ox4, however not sure she had a stroke ("that's what they told me"); had difficulty scanning far enough to her left to see the door to her room      General Comments      Exercises     Assessment/Plan    PT Assessment Patient needs continued PT services  PT Problem List Decreased balance;Decreased mobility;Decreased safety  awareness       PT Treatment Interventions Gait training;Functional mobility training;Therapeutic activities;Balance training;Neuromuscular re-education;Cognitive remediation;Patient/family education    PT Goals (Current goals can be found in the Care Plan section)  Acute Rehab PT Goals Patient Stated Goal: to go home PT Goal Formulation: With patient Time For Goal Achievement: 11/19/19 Potential to Achieve Goals: Good    Frequency Min 4X/week   Barriers to discharge        Co-evaluation               AM-PAC PT "6 Clicks" Mobility  Outcome Measure Help needed turning from your back to your side while in a flat bed without using bedrails?: None Help needed moving from lying on your back to sitting on the side of a flat bed without using bedrails?: None Help needed moving to and from a bed to a chair (including a wheelchair)?: A Little Help needed standing up from a chair using your arms (e.g., wheelchair or bedside chair)?: A Little Help needed to walk in hospital room?: A Little Help needed climbing 3-5 steps with a railing? : A Little 6 Click Score: 20    End of Session   Activity Tolerance: Patient tolerated treatment well Patient  left: in bed;with call bell/phone within reach;with bed alarm set(in chair position; bed turned to encourage left head turn TV)   PT Visit Diagnosis: Other symptoms and signs involving the nervous system (Y81.188)    Time: 6773-7366 PT Time Calculation (min) (ACUTE ONLY): 32 min   Charges:   PT Evaluation $PT Eval Moderate Complexity: 1 Mod PT Treatments $Gait Training: 8-22 mins         Arby Barrette, PT Pager [phone]   Rexanne Mano 11/05/2019, 5:36 PM

## 2019-11-05 NOTE — Progress Notes (Addendum)
Carotid artery duplex completed. Refer to "CV Proc" under chart review to view preliminary results.  Critical results discussed with Elmyra Ricks, PA-C.  11/05/2019 12:10 PM Kelby Aline., MHA, RVT, RDCS, RDMS

## 2019-11-05 NOTE — Consult Note (Signed)
ASSESSMENT & PLAN   SYMPTOMATIC RIGHT CAROTID STENOSIS: This patient has had very tight 90% or greater right carotid stenosis.  Peak systolic velocity is 462 cm/s.  This is symptomatic and she has had multiple scattered infarcts in the right MCA distribution.  She is still somewhat confused but I did speak with her daughter [name].  I have explained that I would recommend right carotid endarterectomy in order to lower her risk of future stroke.  Risk of stroke without surgery is approximately 10 %/year.  I have explained that the risks associated with surgery include a 1 to 2% perioperative risk of stroke, MI, nerve injury, or other unpredictable medical problems.  The daughter would like [place] to proceed I would discuss this with the patient again tomorrow in hopes that she understands.  She is on aspirin but is allergic to statins.  THE PATIENT IS COVID POSITIVE.  REASON FOR CONSULT:    Symptomatic right carotid stenosis.  The consult is requested by Dr. Sloan Leiter.  HPI:   Colleen Parks is a 85 y.o. female who was admitted on 11/02/2018 with confusion, weakness, and poor p.o. intake.  She is confused and it is difficult to obtain a reliable history from the patient.  According to the records she presented with progressive generalized weakness and confusion which had been going on for 7 days.  The symptoms began before the patient had received her COVID-19 vaccine which was about a week prior to admission.  Her work-up included a CT of the head and MRI of the brain which showed scattered acute and subacute infarcts in the right parietal lobe.  Carotid duplex scan showed a high-grade right carotid stenosis and for this reason vascular surgery was consulted.  The patient has a history of stage IV chronic kidney disease and for this reason a CT angiogram of the neck was not obtained.  I have previously performed a left carotid endarterectomy on the patient in March 2018.  I last saw her in the  office on 03/16/2019.  Her carotid duplex scan at that time showed a widely patent left carotid endarterectomy site with a stable 40 to 59% right carotid stenosis.  This had not changed in a year.  Her carotid duplex follow-ups were being done by Dr. Johnsie Cancel.  When I saw her last she had stopped taking her aspirin because she had been bruising I encouraged her to resume taking her aspirin.  She was on something other than a statin for her cholesterol as she said she did not tolerate statins.  She also had a history of peripheral vascular disease.  At the time of her last visit she did admit to calf claudication bilaterally.  She did not have any history of rest pain or nonhealing ulcers. Arterial Doppler study at the time of her last admission showed an ABI of 58% on the right and 70% on the left.  A duplex scan earlier in 2020 showed evidence of infrainguinal arterial occlusive disease bilaterally.  Past Medical History:  Diagnosis Date  . ANCA-associated vasculitis ([place])   . Anxiety   . Cancer [facility])     left breast mastectomy  . Diabetes mellitus    no longer on medication  . GERD (gastroesophageal reflux disease)   . Glomerulonephritis   . HTN (hypertension)   . Hypercholesterolemia   . RAS (renal artery stenosis) ([place])   . Sleep apnea    no cpap use  . Subclavian arterial stenosis ([facility])  Family History  Problem Relation Age of Onset  . Coronary artery disease Other        positive for premature- on fathers side  . Anesthesia problems Neg Hx   . Hypotension Neg Hx   . Malignant hyperthermia Neg Hx   . Pseudochol deficiency Neg Hx     SOCIAL HISTORY: Social History   Tobacco Use  . Smoking status: Former Smoker    Packs/day: 0.50    Years: 4.00    Pack years: 2.00    Types: Cigarettes    Quit date: 12/09/1958    Years since quitting: 60.9  . Smokeless tobacco: Never Used  . Tobacco comment: non-smoker  Substance Use Topics  . Alcohol use: No    Alcohol/week: 0.0  standard drinks    Allergies  Allergen Reactions  . Codeine Nausea And Vomiting  . Ezetimibe Other (See Comments)    Myalgias  . Metformin And Related Other (See Comments)    Had kidney problems and nephrologist told her never to take it again  . Statins Other (See Comments)    Myalgias with rosuvastatin 5mg  daily, lovastatin 20mg  daily, pravastatin 80mg  daily    Current Facility-Administered Medications  Medication Dose Route Frequency Provider Last Rate Last Admin  . acetaminophen (TYLENOL) tablet 650 mg  650 mg Oral Q4H PRN Emokpae, Ejiroghene E, MD       Or  . acetaminophen (TYLENOL) 160 MG/5ML solution 650 mg  650 mg Per Tube Q4H PRN Emokpae, Ejiroghene E, MD       Or  . acetaminophen (TYLENOL) suppository 650 mg  650 mg Rectal Q4H PRN Emokpae, Ejiroghene E, MD      . ascorbic acid (VITAMIN C) tablet 500 mg  500 mg Oral Daily Emokpae, Courage, MD   500 mg at 11/05/19 0929  . aspirin tablet 325 mg  325 mg Oral Daily Roxan Hockey, MD   325 mg at 11/05/19 0929  . chlorpheniramine-HYDROcodone (TUSSIONEX) 10-8 MG/5ML suspension 5 mL  5 mL Oral Q12H PRN Emokpae, Courage, MD      . fenofibrate tablet 54 mg  54 mg Oral Daily Emokpae, Ejiroghene E, MD   54 mg at 50/09/38 1829  . folic acid (FOLVITE) tablet 1 mg  1 mg Oral Daily Emokpae, Courage, MD   1 mg at 11/05/19 0929  . guaiFENesin-dextromethorphan (ROBITUSSIN DM) 100-10 MG/5ML syrup 10 mL  10 mL Oral Q4H PRN Emokpae, Courage, MD      . heparin injection 5,000 Units  5,000 Units Subcutaneous Q8H Emokpae, Courage, MD   5,000 Units at 11/05/19 1242  . insulin aspart (novoLOG) injection 0-6 Units  0-6 Units Subcutaneous TID WC Emokpae, Courage, MD      . multivitamin with minerals tablet 1 tablet  1 tablet Oral Daily Roxan Hockey, MD   1 tablet at 11/05/19 0929  . senna-docusate (Senokot-S) tablet 1 tablet  1 tablet Oral QHS PRN Emokpae, Ejiroghene E, MD      . thiamine tablet 100 mg  100 mg Oral Daily Emokpae, Courage, MD   100  mg at 11/05/19 0929  . zinc sulfate capsule 220 mg  220 mg Oral Daily Emokpae, Courage, MD   220 mg at 11/05/19 9371    REVIEW OF SYSTEMS: The patient is confused and I cannot obtain a reliable review of systems.  PHYSICAL EXAM:   Vitals:   11/05/19 0436 11/05/19 0800 11/05/19 1200 11/05/19 1355  BP: (!) 144/59 (!) 132/45 (!) 150/54 133/70  Pulse: 62  68  Resp: (!) 21     Temp: 99.2 F (37.3 C) 98.1 F (36.7 C)  98.5 F (36.9 C)  TempSrc: Axillary Oral  Oral  SpO2: 99% 96% 97% 98%  Weight:      Height:       Body mass index is 22.24 kg/m.   GENERAL: The patient is a well-nourished female, in no acute distress. The vital signs are documented above. CARDIAC: There is a regular rate and rhythm.  VASCULAR: I do not detect carotid bruits with the stethoscope it is available for isolation patients. She has palpable femoral pulses.  I could not palpate pedal pulses however both feet were warm and well perfused. PULMONARY: There is good air exchange bilaterally without wheezing or rales. ABDOMEN: Soft and non-tender with normal pitched bowel sounds.  I do not palpate an abdominal aortic aneurysm. MUSCULOSKELETAL: There are no major deformities. NEUROLOGIC: On my exam she had good strength in her upper extremities and lower extremities bilaterally.  She was confused and it was difficult to obtain a history from the patient. SKIN: There are no ulcers or rashes noted. PSYCHIATRIC: The patient has a normal affect.  DATA:    Lab Results  Component Value Date   WBC 8.1 11/05/2019   HGB 10.4 (L) 11/05/2019   HCT 33.3 (L) 11/05/2019   MCV 94.1 11/05/2019   PLT 285 11/05/2019   Lab Results  Component Value Date   NA 147 (H) 11/05/2019   K 4.5 11/05/2019   CL 113 (H) 11/05/2019   CO2 23 11/05/2019   Lab Results  Component Value Date   CREATININE 2.56 (H) 11/05/2019   Lab Results  Component Value Date   INR 0.94 11/12/2016   INR 0.90 11/15/2013   INR 0.97 06/30/2013   Lab  Results  Component Value Date   HGBA1C 7.5 (H) 11/03/2019   CBG (last 3)  Recent Labs    11/04/19 2038 11/05/19 0836 11/05/19 1212  GLUCAP 115* 113* 123*   MRI: Her MRI shows widely scattered acute to subacute infarcts in the right MCA territory with evidence of decreased flow in the right ICA suggesting a high-grade proximal carotid stenosis.  CT HEAD WITHOUT CONTRAST: CT of the head on 11/03/2019 showed an area of hypoattenuation in the right parietal lobe concerning for acute to early subacute infarct.  There was no intracranial hemorrhage.  She had old right temporal lobe infarcts and findings of chronic microvascular ischemia.  CAROTID DUPLEX: I have independently interpreted her carotid duplex scan.    On the right,  she has a very tight internal carotid carotid artery stenosis of greater than 80%.  Peak systolic velocity 962 cm/s with end-diastolic velocity 952 cm/s.  The right vertebral artery is patent with antegrade flow.  On the right, the internal carotid artery appeared normal beyond the stenosis and the bifurcation did not appear to be high.  On the left side her carotid endarterectomy site is widely patent.  The left vertebral artery is patent with antegrade flow.  LABS: Coronavirus test is positive.  Her GFR is 17.  Creatinine 2.56.  [name] [facility] [place]: [phone] Office: [phone]

## 2019-11-05 NOTE — Progress Notes (Signed)
   11/05/19 1019  Family/Significant Other Communication  Family/Significant Other Update Called;Updated  updated daughter [name] on Plan of care

## 2019-11-05 NOTE — Consult Note (Addendum)
Referring Physician: Dr. Sloan Leiter    Reason for Consult: Neurologic opinion for stroke symptoms x7d  HPI: Colleen Parks is an 85 y.o. female with PMH of HTN, DM-2, ANCA associated vasculitis/glomerulonephritis with CKD stage IV. Pt is currently somewhat confused and not a good historian, thus information is form EMR. Per notes, pt presented to [facility] on 2/18 with generalized weakness, poor oral intake, confusion for 1 week. Work up at [facility] showed right MCA territory infarct-along with high-grade right ICA stenosis. She was then transferred from [facility] to [facility] for further neurologic care. MRI shows scattered subacute RMCA strokes.   Past Medical History Past Medical History:  Diagnosis Date  . ANCA-associated vasculitis ([place])   . Anxiety   . Cancer [facility])     left breast mastectomy  . Diabetes mellitus    no longer on medication  . GERD (gastroesophageal reflux disease)   . Glomerulonephritis   . HTN (hypertension)   . Hypercholesterolemia   . RAS (renal artery stenosis) ([place])   . Sleep apnea    no cpap use  . Subclavian arterial stenosis [facility])     Surgical History Past Surgical History:  Procedure Laterality Date  . BIOPSY THYROID    . CATARACT EXTRACTION W/PHACO  12/18/2011   Procedure: CATARACT EXTRACTION PHACO AND INTRAOCULAR LENS PLACEMENT (IOC);  Surgeon: Tonny Branch, MD;  Location: [facility] ORS;  Service: Ophthalmology;  Laterality: Right;  CDE:17.77  . CATARACT EXTRACTION W/PHACO  01/26/2012   Procedure: CATARACT EXTRACTION PHACO AND INTRAOCULAR LENS PLACEMENT (IOC);  Surgeon: Tonny Branch, MD;  Location: [facility];  Service: Ophthalmology;  Laterality: Left;  CDE 15.23  . COLONOSCOPY N/A 05/10/2014   Procedure: COLONOSCOPY;  Surgeon: Rogene Houston, MD;  Location: [facility] END[facility] SUITE;  Service: Endoscopy;  Laterality: N/A;  830  . ENDARTERECTOMY Left 11/13/2016   Procedure: LEFT CAROTID ARTERY ENDARTERECTOMY;  Surgeon: Angelia Mould, MD;  Location: [place];  Service: Vascular;  Laterality: Left;   . left breast mastectomy with chemo  1995   . MASTECTOMY Left   . PATCH ANGIOPLASTY Left 11/13/2016   Procedure: PATCH ANGIOPLASTY OF THE LEFT CAROTID ARTERY USING HEMASHEILD PACTH;  Surgeon: Angelia Mould, MD;  Location: [facility] OR;  Service: Vascular;  Laterality: Left;    Family History  Family History  Problem Relation Age of Onset  . Coronary artery disease Other        positive for premature- on fathers side  . Anesthesia problems Neg Hx   . Hypotension Neg Hx   . Malignant hyperthermia Neg Hx   . Pseudochol deficiency Neg Hx     Social History:   reports that she quit smoking about 60 years ago. Her smoking use included cigarettes. She has a 2.00 pack-year smoking history. She has never used smokeless tobacco. She reports that she does not drink alcohol or use drugs.  Allergies:  Allergies  Allergen Reactions  . Codeine Nausea And Vomiting  . Ezetimibe Other (See Comments)    Myalgias  . Metformin And Related Other (See Comments)    Had kidney problems and nephrologist told her never to take it again  . Statins Other (See Comments)    Myalgias with rosuvastatin 5mg  daily, lovastatin 20mg  daily, pravastatin 80mg  daily    Home Medications:  Medications Prior to Admission  Medication Sig Dispense Refill  . acetaminophen (TYLENOL) 325 MG tablet Take 325 mg by mouth every 6 (six) hours as needed for mild pain.    Marland Kitchen amLODipine (NORVASC)  5 MG tablet TAKE 1 TABLET BY MOUTH DAILY. 30 tablet 8  . cyanocobalamin 1000 MCG tablet Take 1,000 mcg by mouth daily.    . Evolocumab (REPATHA SURECLICK) 751 MG/ML SOAJ Inject 1 pen into the skin every 14 (fourteen) days. 2 pen 11  . fenofibrate (TRICOR) 48 MG tablet TAKE 1 TABLET BY MOUTH ONCE A DAY. 90 tablet 3  . FLUoxetine (PROZAC) 10 MG capsule Take 10 mg by mouth daily.    . Multiple Vitamin (MULTIVITAMIN WITH MINERALS) TABS tablet Take 1 tablet by mouth daily. For over 50    . pantoprazole (PROTONIX) 40 MG tablet Take 1 tablet by  mouth as needed.     Marland Kitchen oxybutynin (DITROPAN-XL) 5 MG 24 hr tablet Take 5 mg by mouth daily.      Hospital Medications . vitamin C  500 mg Oral Daily  . aspirin  325 mg Oral Daily  . fenofibrate  54 mg Oral Daily  . folic acid  1 mg Oral Daily  . heparin injection (subcutaneous)  5,000 Units Subcutaneous Q8H  . insulin aspart  0-6 Units Subcutaneous TID WC  . multivitamin with minerals  1 tablet Oral Daily  . thiamine  100 mg Oral Daily  . zinc sulfate  220 mg Oral Daily    ROS: Limited; pt is confused. Denies any pain or problems.  Physical Examination:  Vitals:   11/04/19 2213 11/05/19 0016 11/05/19 0436 11/05/19 0800  BP: (!) 144/53 (!) 136/49 (!) 144/59 (!) 132/45  Pulse: 66 61 62   Resp: 18 20 (!) 21   Temp: 98.1 F (36.7 C) 98.4 F (36.9 C) 99.2 F (37.3 C) 98.1 F (36.7 C)  TempSrc: Oral Oral Axillary Oral  SpO2: 97% 95% 99% 96%  Weight: 62.5 kg     Height:       Physical Exam  GEN- elderly woman in no acute distress HEENT-  normocephalic Lungs- Respirations unlabored, cough noted Heart- S1S2 regular Extremities- no edema, normal ROM Abd- soft, non-tender Skin- no rash, warm/dry  Neurological Examination Mental Status:  Alert,Oriented to name, dob and place. Gives wrong date/yr and age. The patient is a poor historian in the context of current situation and has no insight to her stroke deficits. No dysarthria. Speech is slow, but fluent w/o dysarthria. Some occasional errors noted. 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 Mild left facial asymmetry. Motor: drift on left arm/leg, 4/5 grip on left. Right wnl Sensory: Decreased on left. There is left extinction to DSS.  Deep Tendon Reflexes: 2+ and symmetric throughout Plantars: Right: downgoing                           Left: downgoing Cerebellar:  difficulty wit FTN on left more than right. HTS is also altered, but limited exam Gait: Deferred NIHSS 1a Level of Conscious.: 0 1b LOC Questions: 2 1c LOC Commands: 0 2 Best Gaze: 0 3 Visual: 2 4 Facial Palsy: 1 5a Motor Arm - Left:1 5b Motor Arm - Right:0  6a Motor Leg - Left: 1 6b Motor Leg - Right:0  7 Limb Ataxia: 2 8 Sensory: 1 9 Best Language: 0 10 Dysarthria: 0 11 Extinct. and Inatten.: 1 TOTAL: 11 LABORATORY STUDIES:  Basic Metabolic Panel: Recent Labs  Lab 11/03/19 1736 11/04/19 0544 11/05/19 0936  NA  143 145 147*  K 4.8 4.6 4.5  CL 108 110 113*  CO2 24 24 23   GLUCOSE 123* 101* 110*  BUN 48* 48* 39*  CREATININE 2.89* 2.94* 2.56*  CALCIUM 9.9 9.4 9.7   CBC: Recent Labs  Lab 11/03/19 1736 11/05/19 0936  WBC 8.3 8.1  NEUTROABS 5.4 5.3  HGB 11.2* 10.4*  HCT 36.4 33.3*  MCV 96.3 94.1  PLT 318 285  CBG: Recent Labs  Lab 11/04/19 0758 11/04/19 2038 11/05/19 0836  GLUCAP 94 115* 113*   Urinalysis:  Recent Labs  Lab 11/04/19 1655  COLORURINE YELLOW  LABSPEC 1.014  PHURINE 5.0  GLUCOSEU NEGATIVE  HGBUR NEGATIVE  BILIRUBINUR NEGATIVE  KETONESUR NEGATIVE  PROTEINUR 100*  NITRITE NEGATIVE  LEUKOCYTESUR TRACE*    Lipid Panel:     Component Value Date/Time   CHOL 121 11/04/2019 0544   CHOL 169 08/02/2019 0753   TRIG 203 (H) 11/04/2019 0544   HDL 36 (L) 11/04/2019 0544   HDL 51 08/02/2019 0753   CHOLHDL 3.4 11/04/2019 0544   VLDL 41 (H) 11/04/2019 0544   LDLCALC 44 11/04/2019 0544   LDLCALC 78 08/02/2019 0753    HgbA1C:  Lab Results  Component Value Date   HGBA1C 7.5 (H) 11/03/2019   IMAGING: CT Head Wo Contrast  Result Date: 11/03/2019 CLINICAL DATA:  Ataxia and weakness EXAM: CT HEAD WITHOUT CONTRAST TECHNIQUE: Contiguous axial images were obtained from the base of the skull through the vertex without intravenous contrast. COMPARISON:  None. FINDINGS: Brain: There is an area of hypoattenuation with loss of gray-white  differentiation in the right parietal lobe (series 2, image 17). No intracranial hemorrhage or other extra-axial collection. There is periventricular hypoattenuation compatible with chronic microvascular disease. Area of cystic encephalomalacia in the right temporal lobe. Vascular: No abnormal hyperdensity of the major intracranial arteries or dural venous sinuses. No intracranial atherosclerosis. Skull: The visualized skull base, calvarium and extracranial soft tissues are normal. Sinuses/Orbits: No fluid levels or advanced mucosal thickening of the visualized paranasal sinuses. No mastoid or middle ear effusion. The orbits are normal. IMPRESSION: 1. Area of hypoattenuation with loss of gray-white differentiation in the right parietal lobe, concerning for acute to early subacute infarct. MRI may provide better temporal characterization. 2. No intracranial hemorrhage. 3. Old right temporal lobe infarct and findings of chronic microvascular ischemia. Electronically Signed   By: Ulyses Jarred M.D.   On: 11/03/2019 18:51   MR ANGIO HEAD WO CONTRAST  Addendum Date: 11/03/2019   ADDENDUM REPORT: 11/03/2019 20:41 ADDENDUM: Study discussed by telephone with PA-C TAMMY TRIPLETT on 11/03/2019 at 20:38 hours. Electronically Signed   By: Genevie Ann M.D.   On: 11/03/2019 20:41   Result Date: 11/03/2019 CLINICAL DATA:  85 year old female with ataxia and weakness. Acute to subacute infarct in the right parietal lobe on head CT today. EXAM: MRI HEAD WITHOUT CONTRAST MRA HEAD WITHOUT CONTRAST TECHNIQUE: Multiplanar, multiecho pulse sequences of the brain and surrounding structures were obtained without intravenous contrast. Angiographic images of the head were obtained using MRA technique without contrast. COMPARISON:  Head CT 1829 hours today.  Brain MRI 01/31/2015. FINDINGS: MRI HEAD FINDINGS Brain: Study is intermittently degraded by motion artifact despite repeated imaging attempts. DWI reveals widely scattered cortical and  subcortical restricted diffusion in the right hemisphere, with confluent areas of involvement both in the right posterior temporal/aparietal lobe, and also the right anterior corona radiata (series 3 image 37). All of the diffusion abnormality seems limited to the right MCA  territory. Both axial and coronal DWI is motion degraded but there is no definite left hemisphere or posterior fossa diffusion restriction. T2 and FLAIR hyperintensity in keeping with cytotoxic edema in the widely scattered areas of acute involvement. No associated hemorrhage or mass effect. Superimposed chronic right temporal lobe region CSF cyst which is stable to mildly smaller since 2016. No superimposed midline shift, ventriculomegaly, or acute intracranial hemorrhage. Cervicomedullary junction and pituitary are within normal limits. Chronic encephalomalacia in the left occipital pole is new since 2016. And there are also several small chronic right cerebellar infarcts new from the prior MRI, largest in the SCA territory. There is chronic patchy bilateral white matter T2 and FLAIR hyperintensity which is largely stable. T2 heterogeneity in the deep gray nuclei and pons has not significantly changed. Vascular: Major intracranial vascular flow voids are stable since 2016. The right vertebral artery appears dominant. Skull and upper cervical spine: Upper cervical spine degeneration appears increased since 2016 with up to mild spinal stenosis. Visualized bone marrow signal is within normal limits. Sinuses/Orbits: No acute orbit or paranasal sinus findings. Other: Mastoids are clear. Grossly normal visible internal auditory structures. MRA HEAD FINDINGS Antegrade flow in the posterior circulation. Both distal vertebral arteries appear to remain patent although the right is dominant. Normal right PICA origin. No definite distal vertebral stenosis. Patent vertebrobasilar junction. Patent basilar artery with mild mid basilar stenosis. SCA and right  PCA origins are patent. A left posterior communicating artery is present but the left PCA is occluded. Right PCA is mildly irregular but patent. Asymmetric decreased antegrade flow signal in the right ICA siphon compared to the left. Probably artifactual loss of signal in the anterior genu. The distal ICA is patent. Patent left siphon with no definite left ICA stenosis. Patent left ICA terminus. Dominant left and diminutive or absent right ACA A1 segments such that the right ACA is supplied from the left. Normal anterior communicating artery. Visible ACA branches are within normal limits. Left MCA M1 segment bifurcates early. The visible left MCA branches are patent with no high-grade stenosis identified. The diminished right ICA terminates in the MCA. The right M1 and the MCA bifurcation are patent but with decreased flow. IMPRESSION: 1. Positive for widely scattered acute to subacute infarcts in the Right MCA territory with corresponding decreased flow in the Right ICA such as due to high-grade carotid stenosis in the neck. The right M1 and MCA bifurcation appear to remain patent. 2. Scattered infarct related cytotoxic edema in the right MCA territory with no associated hemorrhage or mass effect. 3. 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 Several chronic right cerebellar infarcts are also new since that time. 4. Chronic benign right temporal lobe CSF cyst. Chronic small vessel disease. Electronically Signed: By: Genevie Ann M.D. On: 11/03/2019 20:32   MR BRAIN WO CONTRAST  Addendum Date: 11/03/2019   ADDENDUM REPORT: 11/03/2019 20:41 ADDENDUM: Study discussed by telephone with PA-C TAMMY TRIPLETT on 11/03/2019 at 20:38 hours. Electronically Signed   By: Genevie Ann M.D.   On: 11/03/2019 20:41   Result Date: 11/03/2019 CLINICAL DATA:  85 year old female with ataxia and weakness. Acute to subacute infarct in the right parietal  lobe on head CT today. EXAM: MRI HEAD WITHOUT CONTRAST MRA HEAD WITHOUT CONTRAST TECHNIQUE: Multiplanar, multiecho pulse sequences of the brain and surrounding structures were obtained without intravenous contrast. Angiographic images of the head were obtained using MRA technique  without contrast. COMPARISON:  Head CT 1829 hours today.  Brain MRI 01/31/2015. FINDINGS: MRI HEAD FINDINGS Brain: Study is intermittently degraded by motion artifact despite repeated imaging attempts. DWI reveals widely scattered cortical and subcortical restricted diffusion in the right hemisphere, with confluent areas of involvement both in the right posterior temporal/aparietal lobe, and also the right anterior corona radiata (series 3 image 37). All of the diffusion abnormality seems limited to the right MCA territory. Both axial and coronal DWI is motion degraded but there is no definite left hemisphere or posterior fossa diffusion restriction. T2 and FLAIR hyperintensity in keeping with cytotoxic edema in the widely scattered areas of acute involvement. No associated hemorrhage or mass effect. Superimposed chronic right temporal lobe region CSF cyst which is stable to mildly smaller since 2016. No superimposed midline shift, ventriculomegaly, or acute intracranial hemorrhage. Cervicomedullary junction and pituitary are within normal limits. Chronic encephalomalacia in the left occipital pole is new since 2016. And there are also several small chronic right cerebellar infarcts new from the prior MRI, largest in the SCA territory. There is chronic patchy bilateral white matter T2 and FLAIR hyperintensity which is largely stable. T2 heterogeneity in the deep gray nuclei and pons has not significantly changed. Vascular: Major intracranial vascular flow voids are stable since 2016. The right vertebral artery appears dominant. Skull and upper cervical spine: Upper cervical spine degeneration appears increased since 2016 with up to mild  spinal stenosis. Visualized bone marrow signal is within normal limits. Sinuses/Orbits: No acute orbit or paranasal sinus findings. Other: Mastoids are clear. Grossly normal visible internal auditory structures. MRA HEAD FINDINGS Antegrade flow in the posterior circulation. Both distal vertebral arteries appear to remain patent although the right is dominant. Normal right PICA origin. No definite distal vertebral stenosis. Patent vertebrobasilar junction. Patent basilar artery with mild mid basilar stenosis. SCA and right PCA origins are patent. A left posterior communicating artery is present but the left PCA is occluded. Right PCA is mildly irregular but patent. Asymmetric decreased antegrade flow signal in the right ICA siphon compared to the left. Probably artifactual loss of signal in the anterior genu. The distal ICA is patent. Patent left siphon with no definite left ICA stenosis. Patent left ICA terminus. Dominant left and diminutive or absent right ACA A1 segments such that the right ACA is supplied from the left. Normal anterior communicating artery. Visible ACA branches are within normal limits. Left MCA M1 segment bifurcates early. The visible left MCA branches are patent with no high-grade stenosis identified. The diminished right ICA terminates in the MCA. The right M1 and the MCA bifurcation are patent but with decreased flow. IMPRESSION: 1. Positive for widely scattered acute to subacute infarcts in the Right MCA territory with corresponding decreased flow in the Right ICA such as due to high-grade carotid stenosis in the neck. The right M1 and MCA bifurcation appear to remain patent. 2. Scattered infarct related cytotoxic edema in the right MCA territory with no associated hemorrhage or mass effect. 3. 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 Several chronic right cerebellar infarcts are also new since that  time. 4. Chronic benign right temporal lobe CSF cyst. Chronic small vessel disease. Electronically Signed: By: Genevie Ann M.D. On: 11/03/2019 20:32   US Venous Img Lower Bilateral (DVT)  Result Date: 11/04/2019 CLINICAL DATA:  85 year old female with recent multifocal right MCA territory infarct. Possible PE. EXAM: BILATERAL LOWER EXTREMITY  VENOUS DOPPLER ULTRASOUND TECHNIQUE: Gray-scale sonography with graded compression, as well as color Doppler and duplex ultrasound were performed to evaluate the lower extremity deep venous systems from the level of the common femoral vein and including the common femoral, femoral, profunda femoral, popliteal and calf veins including the posterior tibial, peroneal and gastrocnemius veins when visible. The superficial great saphenous vein was also interrogated. Spectral Doppler was utilized to evaluate flow at rest and with distal augmentation maneuvers in the common femoral, femoral and popliteal veins. COMPARISON:  None. FINDINGS: RIGHT LOWER EXTREMITY Common Femoral Vein: No evidence of thrombus. Normal compressibility, respiratory phasicity and response to augmentation. Saphenofemoral Junction: No evidence of thrombus. Normal compressibility and flow on color Doppler imaging. Profunda Femoral Vein: No evidence of thrombus. Normal compressibility and flow on color Doppler imaging. Femoral Vein: No evidence of thrombus. Normal compressibility, respiratory phasicity and response to augmentation. Popliteal Vein: No evidence of thrombus. Normal compressibility, respiratory phasicity and response to augmentation. Calf Veins: No evidence of thrombus. Normal compressibility and flow on color Doppler imaging. Other Findings:  None. LEFT LOWER EXTREMITY Common Femoral Vein: No evidence of thrombus. Normal compressibility, respiratory phasicity and response to augmentation. Saphenofemoral Junction: No evidence of thrombus. Normal compressibility and flow on color Doppler imaging.  Profunda Femoral Vein: No evidence of thrombus. Normal compressibility and flow on color Doppler imaging. Femoral Vein: No evidence of thrombus. Normal compressibility, respiratory phasicity and response to augmentation. Popliteal Vein: No evidence of thrombus. Normal compressibility, respiratory phasicity and response to augmentation. Calf Veins: No evidence of thrombus. Normal compressibility and flow on color Doppler imaging. Other Findings:  None. IMPRESSION: No evidence of bilateral lower extremity deep venous thrombosis. Electronically Signed   By: Genevie Ann M.D.   On: 11/04/2019 18:06   DG Chest Portable 1 View  Result Date: 11/03/2019 CLINICAL DATA:  Weakness and decreased oral intake EXAM: PORTABLE CHEST 1 VIEW COMPARISON:  07/04/2013 FINDINGS: The heart size and mediastinal contours are within normal limits. Both lungs are clear. The visualized skeletal structures are unremarkable. IMPRESSION: No active disease. Electronically Signed   By: Ulyses Jarred M.D.   On: 11/03/2019 18:55   ECHOCARDIOGRAM COMPLETE  Result Date: 11/04/2019    ECHOCARDIOGRAM REPORT   Patient Name:   Colleen Parks [facility] Date of Exam: 11/04/2019 Medical Rec #:  [id number]         Height:       66.0 in Accession #:    [id number]        Weight:       140.0 lb Date of Birth:  [birth year]         BSA:          1.72 m Patient Age:    17 years          BP:           142/63 mmHg Patient Gender: F                 HR:           62 bpm. Exam Location:  [facility] Procedure: 2D Echo Indications:    Stroke 434.91 / I163.9  History:        Patient has no prior history of Echocardiogram examinations.                 Stroke; Risk Factors:Hypertension, Diabetes, Dyslipidemia and                 Former Smoker. Cancer.  Sonographer:  Leavy Cella RDCS (AE) Referring Phys: [place]  1. Left ventricular ejection fraction, by estimation, is 60 to 65%. The left ventricle has normal function. The left ventricle has no  regional wall motion abnormalities. There is mild left ventricular hypertrophy. Left ventricular diastolic parameters are consistent with Grade I diastolic dysfunction (impaired relaxation).  2. Right ventricular systolic function is normal. The right ventricular size is normal. There is normal pulmonary artery systolic pressure. The estimated right ventricular systolic pressure is 42.3 mmHg.  3. Left atrial size was mildly dilated.  4. The mitral valve is grossly normal. Trivial mitral valve regurgitation.  5. The aortic valve is tricuspid. Aortic valve regurgitation is not visualized.  6. The inferior vena cava is normal in size with greater than 50% respiratory variability, suggesting right atrial pressure of 3 mmHg. FINDINGS  Left Ventricle: Left ventricular ejection fraction, by estimation, is 60 to 65%. The left ventricle has normal function. The left ventricle has no regional wall motion abnormalities. The left ventricular internal cavity size was normal in size. There is  mild left ventricular hypertrophy. Left ventricular diastolic parameters are consistent with Grade I diastolic dysfunction (impaired relaxation). Right Ventricle: The right ventricular size is normal. No increase in right ventricular wall thickness. Right ventricular systolic function is normal. There is normal pulmonary artery systolic pressure. The tricuspid regurgitant velocity is 2.45 m/s, and  with an assumed right atrial pressure of 3 mmHg, the estimated right ventricular systolic pressure is 53.6 mmHg. Left Atrium: Left atrial size was mildly dilated. Right Atrium: Right atrial size was normal in size. Pericardium: There is no evidence of pericardial effusion. Presence of pericardial fat pad. Mitral Valve: The mitral valve is grossly normal. Mild mitral annular calcification. Trivial mitral valve regurgitation. Tricuspid Valve: The tricuspid valve is grossly normal. Tricuspid valve regurgitation is trivial. Aortic Valve: The aortic  valve is tricuspid. Aortic valve regurgitation is not visualized. Pulmonic Valve: The pulmonic valve was grossly normal. Pulmonic valve regurgitation is trivial. Aorta: The aortic root is normal in size and structure. Venous: The inferior vena cava is normal in size with greater than 50% respiratory variability, suggesting right atrial pressure of 3 mmHg. IAS/Shunts: No atrial level shunt detected by color flow Doppler.  LEFT VENTRICLE PLAX 2D LVIDd:         3.77 cm  Diastology LVIDs:         2.60 cm  LV e' lateral:   7.72 cm/s LV PW:         1.09 cm  LV E/e' lateral: 12.4 LV IVS:        1.20 cm  LV e' medial:    6.96 cm/s LVOT diam:     1.80 cm  LV E/e' medial:  13.7 LV SV Index:   21.00 LVOT Area:     2.54 cm  RIGHT VENTRICLE RV S prime:     10.10 cm/s TAPSE (M-mode): 2.0 cm LEFT ATRIUM             Index       RIGHT ATRIUM           Index LA diam:        3.20 cm 1.86 cm/m  RA Area:     10.80 cm LA Vol (A2C):   32.0 ml 18.62 ml/m RA Volume:   20.20 ml  11.75 ml/m LA Vol (A4C):   63.1 ml 36.72 ml/m LA Biplane Vol: 45.5 ml 26.48 ml/m   AORTA Ao Root diam:  2.40 cm MITRAL VALVE                TRICUSPID VALVE MV Area (PHT): 2.24 cm     TR Peak grad:   24.0 mmHg MV Decel Time: 338 msec     TR Vmax:        245.00 cm/s MV E velocity: 95.60 cm/s MV A velocity: 122.00 cm/s  SHUNTS MV E/A ratio:  0.78         Systemic Diam: 1.80 cm Rozann Lesches MD Electronically signed by Rozann Lesches MD Signature Date/Time: 11/04/2019/11:45:00 AM    Final    Transthoracic Echocardiogram - pending Bilateral Carotid Dopplers - pending  Assessment:  Ms. YARIANA HOAGLUND is a 85 y.o. female  presenting with 7 days of gen weakness, confusion. She was found to have subacute strokes on imaging, mostly involving the R MCA territory, likely due R ICA stenosis. Given the pts CKD IV, unable to do CTA for better evaluation. MRA + CUS ordered instead.  1. Acute ischemic stroke-   LDL -44    Component Value Date/Time   LDLCALC  44 11/04/2019 0544   LDLCALC 78 08/02/2019 0753     HgbA1c - 7.5  UDS neg  VTE prophylaxis - heparin sq Diet  Diet Order            Diet full liquid Room service appropriate? Yes; Fluid consistency: Thin  Diet effective now               none prior to admission, now on ASA 325mg   Ongoing aggressive stroke risk factor management  Therapy recommendations:  pending  Disposition:  Pending  Hypertension  Home BP meds: norvasc  Current BP meds: none   Stable . Permissive hypertension to 180 in setting of carotid stenosis . Long-term BP goal normotensive, pending vascular wk up and possible CEA recommendations  Hyperlipidemia  Home Lipid lowering medication: Tricor  LDL 44, goal < 70 Current lipid lowering medication: none; not indicated Diabetes  Home diabetic meds: none   Current diabetic meds: SSI  HgbA1c 7.5, goal < 7.0 Recent Labs    11/04/19 0758 11/04/19 2038 11/05/19 0836  GLUCAP 94 115* 113*     Other Stroke Risk Factors  Advanced age  ANCA Vasculitis  COVID 63 +   Other Active Problems  COVID +  CKD IV  ICA stenosis- CUS ordered for further wk up and then will need to consult Vascular    Attending Neurologist's note to follow  Desiree Metzger-Cihelka, ARNP-C, ANVP-BC Pager: [phone]  Seen the patient reviewed the above note.  The infarcts actually appear mostly in a watershed type distribution, though embolic phenomena is possible.  In any case, I do think that this represents symptomatic carotid stenosis and that she will need consultation for consideration of carotid revascularization.  Her LDL is currently at goal.  Roland Rack, MD [facility] [phone]  If 7pm- 7am, please page neurology on call as listed in [place].

## 2019-11-06 DIAGNOSIS — I639 Cerebral infarction, unspecified: Secondary | ICD-10-CM

## 2019-11-06 LAB — COMPREHENSIVE METABOLIC PANEL
ALT: 20 U/L (ref 0–44)
AST: 24 U/L (ref 15–41)
Albumin: 3 g/dL — ABNORMAL LOW (ref 3.5–5.0)
Alkaline Phosphatase: 52 U/L (ref 38–126)
Anion gap: 9 (ref 5–15)
BUN: 35 mg/dL — ABNORMAL HIGH (ref 8–23)
CO2: 23 mmol/L (ref 22–32)
Calcium: 9.4 mg/dL (ref 8.9–10.3)
Chloride: 113 mmol/L — ABNORMAL HIGH (ref 98–111)
Creatinine, Ser: 2.37 mg/dL — ABNORMAL HIGH (ref 0.44–1.00)
GFR calc Af Amer: 21 mL/min — ABNORMAL LOW (ref >60)
GFR calc non Af Amer: 18 mL/min — ABNORMAL LOW (ref >60)
Glucose, Bld: 107 mg/dL — ABNORMAL HIGH (ref 70–99)
Potassium: 4.4 mmol/L (ref 3.5–5.1)
Sodium: 145 mmol/L (ref 135–145)
Total Bilirubin: 0.7 mg/dL (ref 0.3–1.2)
Total Protein: 6.6 g/dL (ref 6.5–8.1)

## 2019-11-06 LAB — FERRITIN: Ferritin: 283 ng/mL (ref 11–307)

## 2019-11-06 LAB — CBC WITH DIFFERENTIAL/PLATELET
Abs Immature Granulocytes: 0.03 10*3/uL (ref 0.00–0.07)
Basophils Absolute: 0 10*3/uL (ref 0.0–0.1)
Basophils Relative: 0 %
Eosinophils Absolute: 0.1 10*3/uL (ref 0.0–0.5)
Eosinophils Relative: 1 %
HCT: 32.7 % — ABNORMAL LOW (ref 36.0–46.0)
Hemoglobin: 10.3 g/dL — ABNORMAL LOW (ref 12.0–15.0)
Immature Granulocytes: 0 %
Lymphocytes Relative: 26 %
Lymphs Abs: 1.9 10*3/uL (ref 0.7–4.0)
MCH: 29.3 pg (ref 26.0–34.0)
MCHC: 31.5 g/dL (ref 30.0–36.0)
MCV: 92.9 fL (ref 80.0–100.0)
Monocytes Absolute: 0.9 10*3/uL (ref 0.1–1.0)
Monocytes Relative: 12 %
Neutro Abs: 4.5 10*3/uL (ref 1.7–7.7)
Neutrophils Relative %: 61 %
Platelets: 252 10*3/uL (ref 150–400)
RBC: 3.52 MIL/uL — ABNORMAL LOW (ref 3.87–5.11)
RDW: 11.6 % (ref 11.5–15.5)
WBC: 7.5 10*3/uL (ref 4.0–10.5)
nRBC: 0 % (ref 0.0–0.2)

## 2019-11-06 LAB — GLUCOSE, CAPILLARY
Glucose-Capillary: 97 mg/dL (ref 70–99)
Glucose-Capillary: 130 mg/dL — ABNORMAL HIGH (ref 70–99)
Glucose-Capillary: 106 mg/dL — ABNORMAL HIGH (ref 70–99)
Glucose-Capillary: 122 mg/dL — ABNORMAL HIGH (ref 70–99)

## 2019-11-06 LAB — D-DIMER, QUANTITATIVE: D-Dimer, Quant: 1.84 ug/mL-FEU — ABNORMAL HIGH (ref 0.00–0.50)

## 2019-11-06 LAB — C-REACTIVE PROTEIN: CRP: 1.7 mg/dL — ABNORMAL HIGH (ref <1.0)

## 2019-11-06 MED ORDER — CEFAZOLIN SODIUM-DEXTROSE 2-4 GM/100ML-% IV SOLN
2.0000 g | INTRAVENOUS | Status: AC
  Administered 2019-11-07: 08:00:00 2 g via INTRAVENOUS

## 2019-11-06 NOTE — Progress Notes (Signed)
   VASCULAR SURGERY ASSESSMENT & PLAN:   SYMPTOMATIC RIGHT CAROTID STENOSIS: This patient has had very tight 90% or greater right carotid stenosis.  Peak systolic velocity is 124 cm/s.  This is symptomatic and she has had multiple scattered infarcts in the right MCA distribution.  She is still somewhat confused but I did speak with her daughter [name].  I have explained that I would recommend right carotid endarterectomy in order to lower her risk of future stroke.  Risk of stroke without surgery is approximately 10 %/year.  I have explained that the risks associated with surgery include a 1 to 2% perioperative risk of stroke, MI, nerve injury, or other unpredictable medical problems.  The daughter would like [place] to proceed.  I discussed this with the patient this morning.  Although she seems slightly disoriented at times she answers questions appropriately and is alert and oriented x3.  I explained to her that I would recommend right carotid endarterectomy in order to lower her risk of future stroke.  She is undergone previous left carotid endarterectomy by me back in 2018.  Preop orders have been written.  She is on aspirin but is allergic to statins.  THE PATIENT IS COVID POSITIVE.   SUBJECTIVE:   Alert and oriented x3.  No specific complaints this morning.  PHYSICAL EXAM:   Vitals:   11/05/19 1600 11/05/19 2113 11/06/19 0440 11/06/19 0530  BP: (!) 150/73 (!) 159/57 (!) 128/53 (!) 136/50  Pulse:  65  60  Resp:  16 14 15   Temp:  98.3 F (36.8 C) 97.8 F (36.6 C) 98.4 F (36.9 C)  TempSrc:  Oral Oral Oral  SpO2:  95% 92% 93%  Weight:      Height:       Good strength in the upper extremities and lower extremities with some slight weakness on the left.  LABS:   Lab Results  Component Value Date   WBC 7.5 11/06/2019   HGB 10.3 (L) 11/06/2019   HCT 32.7 (L) 11/06/2019   MCV 92.9 11/06/2019   PLT 252 11/06/2019   Lab Results  Component Value Date   CREATININE 2.37 (H)  11/06/2019    CBG (last 3)  Recent Labs    11/05/19 1212 11/05/19 1756 11/05/19 2112  GLUCAP 123* 108* 151*    PROBLEM LIST:    Principal Problem:   Rt MCA Acute CVA (cerebrovascular accident) ([place]) Active Problems:   Essential hypertension, benign   DM (diabetes mellitus) ([place])   RAS (renal artery stenosis) ([place])   Subclavian arterial stenosis ([place])   COVID-19 virus infection   Carotid stenosis   Pressure injury of skin   CURRENT MEDS:   . vitamin C  500 mg Oral Daily  . aspirin  325 mg Oral Daily  . fenofibrate  54 mg Oral Daily  . folic acid  1 mg Oral Daily  . heparin injection (subcutaneous)  5,000 Units Subcutaneous Q8H  . insulin aspart  0-6 Units Subcutaneous TID WC  . multivitamin with minerals  1 tablet Oral Daily  . thiamine  100 mg Oral Daily  . zinc sulfate  220 mg Oral Daily    [name] Office: [phone] 11/06/2019

## 2019-11-06 NOTE — Progress Notes (Signed)
STROKE TEAM PROGRESS NOTE   INTERVAL HISTORY No family is at the bedside.  Pt lying in bed, still has left hemianopia, mild left facial droop and LUE weakness. LLE strong. VVS on board and plan for CEA tomorrow. Pt is aware.   OBJECTIVE Vitals:   11/05/19 2113 11/06/19 0440 11/06/19 0530 11/06/19 0800  BP: (!) 159/57 (!) 128/53 (!) 136/50 (!) 144/60  Pulse: 65  60   Resp: 16 14 15    Temp: 98.3 F (36.8 C) 97.8 F (36.6 C) 98.4 F (36.9 C) 98.5 F (36.9 C)  TempSrc: Oral Oral Oral Oral  SpO2: 95% 92% 93% 97%  Weight:      Height:        CBC:  Recent Labs  Lab 11/05/19 0936 11/06/19 0500  WBC 8.1 7.5  NEUTROABS 5.3 4.5  HGB 10.4* 10.3*  HCT 33.3* 32.7*  MCV 94.1 92.9  PLT 285 683    Basic Metabolic Panel:  Recent Labs  Lab 11/05/19 0936 11/06/19 0500  NA 147* 145  K 4.5 4.4  CL 113* 113*  CO2 23 23  GLUCOSE 110* 107*  BUN 39* 35*  CREATININE 2.56* 2.37*  CALCIUM 9.7 9.4    Lipid Panel:     Component Value Date/Time   CHOL 121 11/04/2019 0544   CHOL 169 08/02/2019 0753   TRIG 203 (H) 11/04/2019 0544   HDL 36 (L) 11/04/2019 0544   HDL 51 08/02/2019 0753   CHOLHDL 3.4 11/04/2019 0544   VLDL 41 (H) 11/04/2019 0544   LDLCALC 44 11/04/2019 0544   LDLCALC 78 08/02/2019 0753   HgbA1c:  Lab Results  Component Value Date   HGBA1C 7.5 (H) 11/03/2019   Urine Drug Screen: No results found for: LABOPIA, COCAINSCRNUR, LABBENZ, AMPHETMU, THCU, LABBARB  Alcohol Level No results found for: [facility]  IMAGING  CT Head Wo Contrast 11/03/2019 IMPRESSION: 1. Area of hypoattenuation with loss of gray-white differentiation in the right parietal lobe, concerning for acute to early subacute infarct. MRI may provide better temporal characterization. 2. No intracranial hemorrhage. 3. Old right temporal lobe infarct and findings of chronic microvascular ischemia.  MRI / MRA Head 11/03/2019 IMPRESSION: 1. Positive for widely scattered acute to subacute infarcts in the Right  MCA territory with corresponding decreased flow in the Right ICA such as due to high-grade carotid stenosis in the neck. The right M1 and MCA bifurcation appear to remain patent. 2. Scattered infarct related cytotoxic edema in the right MCA territory with no associated hemorrhage or mass effect. 3. 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 Several chronic right cerebellar infarcts are also new since that time. 4. Chronic benign right temporal lobe CSF cyst. Chronic small vessel disease.  US Venous Img Lower Bilateral (DVT) 11/04/2019 IMPRESSION:  No evidence of bilateral lower extremity deep venous thrombosis.   ECHOCARDIOGRAM COMPLETE 11/04/2019 IMPRESSIONS   1. Left ventricular ejection fraction, by estimation, is 60 to 65%. The left ventricle has normal function. The left ventricle has no regional wall motion abnormalities. There is mild left ventricular hypertrophy. Left ventricular diastolic parameters are consistent with Grade I diastolic dysfunction (impaired relaxation).   2. Right ventricular systolic function is normal. The right ventricular size is normal. There is normal pulmonary artery systolic pressure. The estimated right ventricular systolic pressure is 41.9 mmHg.   3. Left atrial size was mildly dilated.   4. The mitral valve is grossly normal. Trivial mitral  valve regurgitation.   5. The aortic valve is tricuspid. Aortic valve regurgitation is not visualized.   6. The inferior vena cava is normal in size with greater than 50% respiratory variability, suggesting right atrial pressure of 3 mmHg.   VAS US CAROTID 11/05/2019 Summary:  Right Carotid: Velocities in the right ICA are consistent with a 80-99% stenosis. The ECA appears >50% stenosed.  Left Carotid: Velocities in the left ICA are consistent with a 1-39% stenosis.  Vertebrals:  Bilateral vertebral arteries demonstrate antegrade  flow.  Subclavians: Normal flow hemodynamics were seen in bilateral subclavian arteries.  Final     ECG - SR rate 72 BPM. (See cardiology reading for complete details)  PHYSICAL EXAM  Temp:  [97.6 F (36.4 C)-98.5 F (36.9 C)] 97.6 F (36.4 C) (02/21 1600) Pulse Rate:  [60-66] 65 (02/21 1600) Resp:  [14-18] 18 (02/21 1600) BP: (115-159)/(41-60) 115/41 (02/21 1600) SpO2:  [92 %-97 %] 95 % (02/21 1600)  General - Well nourished, well developed, in no apparent distress.  Ophthalmologic - fundi not visualized due to noncooperation.  Cardiovascular - Regular rhythm and rate.  Mental Status -  Level of arousal and orientation to time, place, and person were intact. Language including expression, naming, repetition, comprehension was assessed and found intact. Fund of Knowledge was assessed and was intact.  Cranial Nerves II - XII - II - left homonymous hemianopia. III, IV, VI - Extraocular movements intact. V - Facial sensation intact bilaterally. VII - mild left facial droop. VIII - Hearing & vestibular intact bilaterally. X - Palate elevates symmetrically. XI - Chin turning & shoulder shrug intact bilaterally. XII - Tongue protrusion intact.  Motor Strength - The patient's strength was normal in all extremities except left upper extremity 4/5 proximal and distal and pronator drift was present.  Bulk was normal and fasciculations were absent.   Motor Tone - Muscle tone was assessed at the neck and appendages and was normal.  Reflexes - The patient's reflexes were symmetrical in all extremities and she had no pathological reflexes.  Sensory - Light touch, temperature/pinprick were assessed and were symmetrical.    Coordination - The patient had normal movements in the hands with no ataxia or dysmetria.  Tremor was absent.  Gait and Station - deferred.   ASSESSMENT/PLAN Ms. ASUNA PETH is a 85 y.o. 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 She did not receive IV t-PA due to late presentation (>4.5 hours from time of onset).  Stroke: Rt MCA scattered infarcts likely artery to artery embolic due to right carotid artery high-grade stenosis.  CT head - Area of hypoattenuation with loss of gray-white differentiation in the right parietal lobe, concerning for acute to early subacute infarct. Old right temporal lobe infarct and findings of chronic microvascular ischemia  MRI and MRA head - subacute infarcts in the Right MCA territory with corresponding decreased flow in the Right ICA such as due to high-grade carotid stenosis in the neck. Infarct related cytotoxic edema in the right MCA territory. 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 Several chronic right cerebellar infarcts are also new since that time.  Carotid Doppler - right ICA consistent with an 80-99% stenosis.   2D Echo EF 60-65%  LDL - 44  HgbA1c - 7.5  VTE prophylaxis - [place] Heparin  No antithrombotic prior to admission, now on aspirin 325 mg daily. Further antiplatelet regimen per VVS.   Patient counseled to be compliant with her antithrombotic medications  Ongoing aggressive stroke risk factor management  Therapy recommendations:  pending  Disposition:  Pending  Carotid stenosis  History of left CEA in 2015 with Dr. Scot Dock  Carotid Doppler right ICA 80 to 99% stenosis  MRI showed decreased right ICA flow  VVS Dr. Scot Dock on board  Plan right CEA tomorrow  COVID infection  Lacey Jensen Virus 2 - positive  Chest x-ray without pneumonia.    On room air   follow inflammatory markers    Hypertension  Home BP meds: Norvasc  Current BP meds: none   Stable . BP goal 130-160 prior to carotid  intervention  Hyperlipidemia  Home Lipid lowering medication: none  LDL 44, goal < 70  Current lipid lowering medication: none   Statin intolerance - on [place]atha at home - continue  Diabetes  Home diabetic meds: none   Current diabetic meds: insulin  HgbA1c 7.5, goal < 7.0  SSI  CBG monitoring  Other Stroke Risk Factors  Advanced age  Former cigarette smoker - quit  Hx strokes by imaging  Obstructive sleep apnea, not on CPAP at home  Other Active Problems  Code status - Full Code  CKD stage 4 - creatinine - 2.37  Hospital day # 3  Neurology will sign off. Please call with questions. Pt will follow up with stroke clinic NP at [facility] in about 4 weeks. Thanks for the consult.  Rosalin Hawking, MD PhD Stroke Neurology 11/06/2019 4:59 PM   To contact Stroke Continuity provider, please refer to [email or web address] After hours, contact General Neurology

## 2019-11-06 NOTE — Progress Notes (Signed)
Physical Therapy Treatment Patient Details Name: Colleen Parks MRN: [id number] DOB: 04-19-1935 Today's Date: 11/06/2019    History of Present Illness 85 y.o. 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 Pt presented to [facility] on 2/18 with generalized weakness, poor oral intake, confusion for 1 week. Work up at [facility] showed right MCA territory infarct-along with high-grade right ICA stenosis. She was then transferred from [facility] to [facility] for further neurologic care. MRI shows scattered subacute RMCA strokes. + COVID 19 test 11/03/19    PT Comments    Continuing work on functional mobility and activity tolerance;  Able to incr amb distance, and trialed using a RW versus no device; ultimately the RW slowed Colleen Parks down considerably; will consider cane versus no device next session; L neglect persists; VSS on Room Air  Follow Up Recommendations  Home health PT;Supervision/Assistance - 24 hour(if family can provide 24/7 supervision)     Equipment Recommendations  Other (comment)(may consider trying a cane)    Recommendations for Other Services OT consult;Speech consult     Precautions / Restrictions Precautions Precautions: Fall    Mobility  Bed Mobility Overal bed mobility: Needs Assistance Bed Mobility: Sit to Supine       Sit to supine: Modified independent (Device/Increase time)      Transfers Overall transfer level: Needs assistance Equipment used: None Transfers: Sit to/from Stand Sit to Stand: Min guard         General transfer comment: for safety; no imbalance noted  Ambulation/Gait Ambulation/Gait assistance: Min guard Gait Distance (Feet): 170 [phone]) Assistive device: None;Rolling walker (2 wheeled) Gait Pattern/deviations: Step-through pattern     General Gait Details: Walked the circle around nurse's station, making a circle to the L; cues to scan L; Slow gait, but no  overt loss of balance; I may have cued her too much to identify objects/peaople on her left to be able to discern if there was R drift; tried amb with RW, and it slowed her down considerably; she also held the RW so tightly that we couldn't get a reliable O2 sat reading - ultimately we returned to her room   Stairs             Wheelchair Mobility    Modified Rankin (Stroke Patients Only) Modified Rankin (Stroke Patients Only) Pre-Morbid Rankin Score: No symptoms Modified Rankin: Moderately severe disability     Balance                                            Cognition Arousal/Alertness: Awake/alert Behavior During Therapy: Restless;Impulsive Overall Cognitive Status: No family/caregiver present to determine baseline cognitive functioning                                 General Comments: a&ox4, however not sure she had a stroke ("that's what they told me"); had difficulty scanning far enough to her left to see the door to her room      Exercises      General Comments General comments (skin integrity, edema, etc.): Walked on room air and O2 sats reamined in 90s      Pertinent Vitals/Pain Pain Assessment: No/denies pain    Home Living  Prior Function            PT Goals (current goals can now be found in the care plan section) Acute Rehab PT Goals Patient Stated Goal: to go home PT Goal Formulation: With patient Time For Goal Achievement: 11/19/19 Potential to Achieve Goals: Good Progress towards PT goals: Progressing toward goals    Frequency    Min 4X/week      PT Plan Current plan remains appropriate    Co-evaluation              AM-PAC PT "6 Clicks" Mobility   Outcome Measure  Help needed turning from your back to your side while in a flat bed without using bedrails?: None Help needed moving from lying on your back to sitting on the side of a flat bed without using  bedrails?: None Help needed moving to and from a bed to a chair (including a wheelchair)?: A Little Help needed standing up from a chair using your arms (e.g., wheelchair or bedside chair)?: A Little Help needed to walk in hospital room?: A Little Help needed climbing 3-5 steps with a railing? : A Little 6 Click Score: 20    End of Session Equipment Utilized During Treatment: Gait belt Activity Tolerance: Patient tolerated treatment well Patient left: in bed;with call bell/phone within reach;with bed alarm set Nurse Communication: Mobility status PT Visit Diagnosis: Other symptoms and signs involving the nervous system (H99.774)     Time: 1423-9532 PT Time Calculation (min) (ACUTE ONLY): 32 min  Charges:  $Gait Training: 23-37 mins                     Roney [place], [place]  Acute Rehabilitation Services Pager [phone] Office [phone]    Colleen Parks 11/06/2019, 4:01 PM

## 2019-11-06 NOTE — Progress Notes (Signed)
   11/06/19 1138  Family/Significant Other Communication  Family/Significant Other Update Called  updated daughter cheryl on plan of care.

## 2019-11-06 NOTE — Progress Notes (Signed)
PROGRESS NOTE                                                                                                                                                                                                             Patient Demographics:    Colleen Parks, is a 85 y.o. female, DOB - 07/18/1935, AST:[id number]  Outpatient Primary MD for the patient is Lemmie Evens, MD   Admit date - 11/03/2019   LOS - 3  Chief Complaint  Patient presents with   Weakness       Brief Narrative: Patient is a 85 y.o. 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  Transferred from [facility] to [facility] for neurology evaluation.  See below for further details.   Subjective:    Colleen Parks appears unchanged-she continues to have some neglect on the left side.  She is able to follow some commands-and responds appropriately to some questions..   Assessment  & Plan :   Acute Right MCA stroke: Setting of right ICA high-grade stenosis-has slight weakness in left upper extremity compared to left lower extremity.  A1c 7.5, LDL 44, echo without any embolic source-telemetry negative for A. fib.  Appreciate neurology and VVS input-she is scheduled for carotid endarterectomy tomorrow.    COVID-19 infection: Appears asymptomatic-follow inflammatory markers.  Chest x-ray without pneumonia.  On room air this morning.  Fever: afebrile  O2 requirements:  SpO2: 97 %   COVID-19 Labs: Recent Labs    11/04/19 1451 11/05/19 0936 11/06/19 0500  DDIMER 2.95* 2.12* 1.84*  FERRITIN 253 278 283  LDH 151  --   --   CRP 2.7* 2.3* 1.7*    No results found for: BNP  No results for input(s): PROCALCITON in the last 168 hours.  Lab Results  Component Value Date   SARSCOV2NAA POSITIVE (A) 11/03/2019     COVID-19 Medications: None  DVT Prophylaxis  :  Heparin  Mildly elevated D-dimer: Not hypoxic-lower extremity Doppler negative-doubt requires any further work-up in the absence of hypoxemia.  AKI on CKD stage IV: Improving with supportive care-euvolemic on exam-continue to avoid nephrotoxic agents.   Acute metabolic encephalopathy: In the setting of CVA-and left-sided neglect-seems reasonably awake and  alert this morning-supportive care.  History of left carotid stenosis s/p left CEA in 2018  History of ANCA/glomerulonephritis: Not on any immunosuppressive's for the past several years-follows with nephrology in the outpatient setting.  HLD: On Repatha infusion as outpatient-on fenofibrate as well.  LDL as above.  HTN: Hold all antihypertensives-allow permissive hypertension in the setting of CVA with high-grade ICA stenosis.  DM-2 (A1c 7.5): CBG stable with SSI  CBG (last 3)  Recent Labs    11/05/19 1756 11/05/19 2112 11/06/19 0759  GLUCAP 108* 151* 97       RN pressure injury documentation: Pressure Injury 11/04/19 Sacrum Right;Left Stage 1 -  Intact skin with non-blanchable redness of a localized area usually over a bony prominence. (Active)  11/04/19 2311  Location: Sacrum  Location Orientation: Right;Left  Staging: Stage 1 -  Intact skin with non-blanchable redness of a localized area usually over a bony prominence.  Wound Description (Comments):   Present on Admission: Yes    Consults  : Neurology  Procedures  :  None  ABG: No results found for: PHART, PCO2ART, PO2ART, HCO3, TCO2, ACIDBASEDEF, O2SAT  Vent Settings: N/A  Condition -  Guarded  Family Communication  :  Daughter updated over the phone 2/21  Code Status :  Full Code  Diet :  Diet Order            Diet NPO time specified Except for: Sips with Meds  Diet effective midnight        Diet full liquid Room service appropriate? Yes; Fluid consistency:  Thin  Diet effective now               Disposition Plan  :  Remain hospitalized-probably either SNF versus home health services-depending on clinical trajectory.  Barriers to discharge: CVA work-up/neurology evaluation in progress  Antimicorbials  :    Anti-infectives (From admission, onward)   Start     Dose/Rate Route Frequency Ordered Stop   11/07/19 0700  ceFAZolin (ANCEF) IVPB 2g/100 mL premix    Note to Pharmacy: Send with pt to OR   2 g 200 mL/hr over 30 Minutes Intravenous On call 11/06/19 0752 11/08/19 0700      Inpatient Medications  Scheduled Meds:  vitamin C  500 mg Oral Daily   aspirin  325 mg Oral Daily   fenofibrate  54 mg Oral Daily   folic acid  1 mg Oral Daily   heparin injection (subcutaneous)  5,000 Units Subcutaneous Q8H   insulin aspart  0-6 Units Subcutaneous TID WC   multivitamin with minerals  1 tablet Oral Daily   thiamine  100 mg Oral Daily   zinc sulfate  220 mg Oral Daily   Continuous Infusions:  [START ON 11/07/2019]  ceFAZolin (ANCEF) IV     PRN Meds:.acetaminophen **OR** acetaminophen (TYLENOL) oral liquid 160 mg/5 mL **OR** acetaminophen, chlorpheniramine-HYDROcodone, guaiFENesin-dextromethorphan, senna-docusate   Time Spent in minutes  25  See all Orders from today for further details   Oren Binet M.D on 11/06/2019 at 11:46 AM  To page go to [email or web address] - use universal password  [facility] -  Office  [phone]    Objective:   Vitals:   11/05/19 2113 11/06/19 0440 11/06/19 0530 11/06/19 0800  BP: (!) 159/57 (!) 128/53 (!) 136/50 (!) 144/60  Pulse: 65  60   Resp: 16 14 15    Temp: 98.3 F (36.8 C) 97.8 F (36.6 C) 98.4 F (36.9 C) 98.5 F (36.9 C)  TempSrc: Oral Oral  Oral Oral  SpO2: 95% 92% 93% 97%  Weight:      Height:        Wt Readings from Last 3 Encounters:  11/04/19 62.5 kg  03/16/19 64 kg  11/17/18 65.2 kg     Intake/Output Summary (Last 24 hours) at 11/06/2019 1146 Last data  filed at 11/06/2019 2993 Gross per 24 hour  Intake 120 ml  Output --  Net 120 ml     Physical Exam Gen Exam:Alert awake-not in any distress HEENT:atraumatic, normocephalic Chest: B/L clear to auscultation anteriorly CVS:S1S2 regular Abdomen:soft non tender, non distended Extremities:no edema Neurology: mild left sided hemiparesis Skin: no rash   Data Review:    CBC Recent Labs  Lab 11/03/19 1736 11/05/19 0936 11/06/19 0500  WBC 8.3 8.1 7.5  HGB 11.2* 10.4* 10.3*  HCT 36.4 33.3* 32.7*  PLT 318 285 252  MCV 96.3 94.1 92.9  MCH 29.6 29.4 29.3  MCHC 30.8 31.2 31.5  RDW 11.6 11.5 11.6  LYMPHSABS 1.9 1.9 1.9  MONOABS 0.8 0.7 0.9  EOSABS 0.0 0.1 0.1  BASOSABS 0.0 0.0 0.0    Chemistries  Recent Labs  Lab 11/03/19 1736 11/04/19 0544 11/05/19 0936 11/06/19 0500  NA 143 145 147* 145  K 4.8 4.6 4.5 4.4  CL 108 110 113* 113*  CO2 24 24 23 23   GLUCOSE 123* 101* 110* 107*  BUN 48* 48* 39* 35*  CREATININE 2.89* 2.94* 2.56* 2.37*  CALCIUM 9.9 9.4 9.7 9.4  AST 22  --  19 24  ALT 21  --  17 20  ALKPHOS 59  --  55 52  BILITOT 0.7  --  0.6 0.7   ------------------------------------------------------------------------------------------------------------------ Recent Labs    11/04/19 0544  CHOL 121  HDL 36*  LDLCALC 44  TRIG 203*  CHOLHDL 3.4    Lab Results  Component Value Date   HGBA1C 7.5 (H) 11/03/2019   ------------------------------------------------------------------------------------------------------------------ No results for input(s): TSH, T4TOTAL, T3FREE, THYROIDAB in the last 72 hours.  Invalid input(s): FREET3 ------------------------------------------------------------------------------------------------------------------ Recent Labs    11/05/19 0936 11/06/19 0500  FERRITIN 278 283    Coagulation profile No results for input(s): INR, PROTIME in the last 168 hours.  Recent Labs    11/05/19 0936 11/06/19 0500  DDIMER 2.12* 1.84*     Cardiac Enzymes No results for input(s): CKMB, TROPONINI, MYOGLOBIN in the last 168 hours.  Invalid input(s): CK ------------------------------------------------------------------------------------------------------------------ No results found for: BNP  Micro Results Recent Results (from the past 240 hour(s))  SARS CORONAVIRUS 2 (TAT 6-24 HRS) Nasopharyngeal Nasopharyngeal Swab     Status: Abnormal   Collection Time: 11/03/19  9:22 PM   Specimen: Nasopharyngeal Swab  Result Value Ref Range Status   SARS Coronavirus 2 POSITIVE (A) NEGATIVE Final    Comment: RESULT CALLED TO, READ BACK BY AND VERIFIED WITH: Jani Gravel RN 14:15 11/04/19 ([email or web address]) (NOTE) SARS-CoV-2 target nucleic acids are DETECTED. The SARS-CoV-2 RNA is generally detectable in upper and lower respiratory specimens during the acute phase of infection. Positive results are indicative of the presence of SARS-CoV-2 RNA. Clinical correlation with patient history and other diagnostic information is  necessary to determine patient infection status. Positive results do not rule out bacterial infection or co-infection with other viruses.  The expected result is Negative. Fact Sheet for Patients: SugarRoll.be Fact Sheet for Healthcare Providers: https://www.woods-mathews.com/ This test is not yet approved or cleared by the [place] FDA and  has been authorized for detection and/or diagnosis of SARS-CoV-2 by FDA  under an Emergency Use Authorization (EUA). This EUA will remain  in effect (meaning this test can be used) for th e duration of the COVID-19 declaration under Section 564(b)(1) of the Act, 21 U.S.C. section 360bbb-3(b)(1), unless the authorization is terminated or revoked sooner. Performed at [facility], [place] [address], [place], [place] [address]     Radiology Reports CT Head Wo Contrast  Result Date: 11/03/2019 CLINICAL DATA:  Ataxia and weakness EXAM:  CT HEAD WITHOUT CONTRAST TECHNIQUE: Contiguous axial images were obtained from the base of the skull through the vertex without intravenous contrast. COMPARISON:  None. FINDINGS: Brain: There is an area of hypoattenuation with loss of gray-white differentiation in the right parietal lobe (series 2, image 17). No intracranial hemorrhage or other extra-axial collection. There is periventricular hypoattenuation compatible with chronic microvascular disease. Area of cystic encephalomalacia in the right temporal lobe. Vascular: No abnormal hyperdensity of the major intracranial arteries or dural venous sinuses. No intracranial atherosclerosis. Skull: The visualized skull base, calvarium and extracranial soft tissues are normal. Sinuses/Orbits: No fluid levels or advanced mucosal thickening of the visualized paranasal sinuses. No mastoid or middle ear effusion. The orbits are normal. IMPRESSION: 1. Area of hypoattenuation with loss of gray-white differentiation in the right parietal lobe, concerning for acute to early subacute infarct. MRI may provide better temporal characterization. 2. No intracranial hemorrhage. 3. Old right temporal lobe infarct and findings of chronic microvascular ischemia. Electronically Signed   By: Ulyses Jarred M.D.   On: 11/03/2019 18:51   MR ANGIO HEAD WO CONTRAST  Addendum Date: 11/03/2019   ADDENDUM REPORT: 11/03/2019 20:41 ADDENDUM: Study discussed by telephone with PA-C TAMMY TRIPLETT on 11/03/2019 at 20:38 hours. Electronically Signed   By: Genevie Ann M.D.   On: 11/03/2019 20:41   Result Date: 11/03/2019 CLINICAL DATA:  85 year old female with ataxia and weakness. Acute to subacute infarct in the right parietal lobe on head CT today. EXAM: MRI HEAD WITHOUT CONTRAST MRA HEAD WITHOUT CONTRAST TECHNIQUE: Multiplanar, multiecho pulse sequences of the brain and surrounding structures were obtained without intravenous contrast. Angiographic images of the head were obtained using MRA technique  without contrast. COMPARISON:  Head CT 1829 hours today.  Brain MRI 01/31/2015. FINDINGS: MRI HEAD FINDINGS Brain: Study is intermittently degraded by motion artifact despite repeated imaging attempts. DWI reveals widely scattered cortical and subcortical restricted diffusion in the right hemisphere, with confluent areas of involvement both in the right posterior temporal/aparietal lobe, and also the right anterior corona radiata (series 3 image 37). All of the diffusion abnormality seems limited to the right MCA territory. Both axial and coronal DWI is motion degraded but there is no definite left hemisphere or posterior fossa diffusion restriction. T2 and FLAIR hyperintensity in keeping with cytotoxic edema in the widely scattered areas of acute involvement. No associated hemorrhage or mass effect. Superimposed chronic right temporal lobe region CSF cyst which is stable to mildly smaller since 2016. No superimposed midline shift, ventriculomegaly, or acute intracranial hemorrhage. Cervicomedullary junction and pituitary are within normal limits. Chronic encephalomalacia in the left occipital pole is new since 2016. And there are also several small chronic right cerebellar infarcts new from the prior MRI, largest in the SCA territory. There is chronic patchy bilateral white matter T2 and FLAIR hyperintensity which is largely stable. T2 heterogeneity in the deep gray nuclei and pons has not significantly changed. Vascular: Major intracranial vascular flow voids are stable since 2016. The right vertebral artery appears dominant. Skull and upper  cervical spine: Upper cervical spine degeneration appears increased since 2016 with up to mild spinal stenosis. Visualized bone marrow signal is within normal limits. Sinuses/Orbits: No acute orbit or paranasal sinus findings. Other: Mastoids are clear. Grossly normal visible internal auditory structures. MRA HEAD FINDINGS Antegrade flow in the posterior circulation. Both  distal vertebral arteries appear to remain patent although the right is dominant. Normal right PICA origin. No definite distal vertebral stenosis. Patent vertebrobasilar junction. Patent basilar artery with mild mid basilar stenosis. SCA and right PCA origins are patent. A left posterior communicating artery is present but the left PCA is occluded. Right PCA is mildly irregular but patent. Asymmetric decreased antegrade flow signal in the right ICA siphon compared to the left. Probably artifactual loss of signal in the anterior genu. The distal ICA is patent. Patent left siphon with no definite left ICA stenosis. Patent left ICA terminus. Dominant left and diminutive or absent right ACA A1 segments such that the right ACA is supplied from the left. Normal anterior communicating artery. Visible ACA branches are within normal limits. Left MCA M1 segment bifurcates early. The visible left MCA branches are patent with no high-grade stenosis identified. The diminished right ICA terminates in the MCA. The right M1 and the MCA bifurcation are patent but with decreased flow. IMPRESSION: 1. Positive for widely scattered acute to subacute infarcts in the Right MCA territory with corresponding decreased flow in the Right ICA such as due to high-grade carotid stenosis in the neck. The right M1 and MCA bifurcation appear to remain patent. 2. Scattered infarct related cytotoxic edema in the right MCA territory with no associated hemorrhage or mass effect. 3. 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 Several chronic right cerebellar infarcts are also new since that time. 4. Chronic benign right temporal lobe CSF cyst. Chronic small vessel disease. Electronically Signed: By: Genevie Ann M.D. On: 11/03/2019 20:32   MR BRAIN WO CONTRAST  Addendum Date: 11/03/2019   ADDENDUM REPORT: 11/03/2019 20:41 ADDENDUM: Study discussed by telephone with  PA-C TAMMY TRIPLETT on 11/03/2019 at 20:38 hours. Electronically Signed   By: Genevie Ann M.D.   On: 11/03/2019 20:41   Result Date: 11/03/2019 CLINICAL DATA:  85 year old female with ataxia and weakness. Acute to subacute infarct in the right parietal lobe on head CT today. EXAM: MRI HEAD WITHOUT CONTRAST MRA HEAD WITHOUT CONTRAST TECHNIQUE: Multiplanar, multiecho pulse sequences of the brain and surrounding structures were obtained without intravenous contrast. Angiographic images of the head were obtained using MRA technique without contrast. COMPARISON:  Head CT 1829 hours today.  Brain MRI 01/31/2015. FINDINGS: MRI HEAD FINDINGS Brain: Study is intermittently degraded by motion artifact despite repeated imaging attempts. DWI reveals widely scattered cortical and subcortical restricted diffusion in the right hemisphere, with confluent areas of involvement both in the right posterior temporal/aparietal lobe, and also the right anterior corona radiata (series 3 image 37). All of the diffusion abnormality seems limited to the right MCA territory. Both axial and coronal DWI is motion degraded but there is no definite left hemisphere or posterior fossa diffusion restriction. T2 and FLAIR hyperintensity in keeping with cytotoxic edema in the widely scattered areas of acute involvement. No associated hemorrhage or mass effect. Superimposed chronic right temporal lobe region CSF cyst which is stable to mildly smaller since 2016. No superimposed midline shift, ventriculomegaly, or acute intracranial hemorrhage. Cervicomedullary junction and pituitary are within normal limits. Chronic encephalomalacia  in the left occipital pole is new since 2016. And there are also several small chronic right cerebellar infarcts new from the prior MRI, largest in the SCA territory. There is chronic patchy bilateral white matter T2 and FLAIR hyperintensity which is largely stable. T2 heterogeneity in the deep gray nuclei and pons has not  significantly changed. Vascular: Major intracranial vascular flow voids are stable since 2016. The right vertebral artery appears dominant. Skull and upper cervical spine: Upper cervical spine degeneration appears increased since 2016 with up to mild spinal stenosis. Visualized bone marrow signal is within normal limits. Sinuses/Orbits: No acute orbit or paranasal sinus findings. Other: Mastoids are clear. Grossly normal visible internal auditory structures. MRA HEAD FINDINGS Antegrade flow in the posterior circulation. Both distal vertebral arteries appear to remain patent although the right is dominant. Normal right PICA origin. No definite distal vertebral stenosis. Patent vertebrobasilar junction. Patent basilar artery with mild mid basilar stenosis. SCA and right PCA origins are patent. A left posterior communicating artery is present but the left PCA is occluded. Right PCA is mildly irregular but patent. Asymmetric decreased antegrade flow signal in the right ICA siphon compared to the left. Probably artifactual loss of signal in the anterior genu. The distal ICA is patent. Patent left siphon with no definite left ICA stenosis. Patent left ICA terminus. Dominant left and diminutive or absent right ACA A1 segments such that the right ACA is supplied from the left. Normal anterior communicating artery. Visible ACA branches are within normal limits. Left MCA M1 segment bifurcates early. The visible left MCA branches are patent with no high-grade stenosis identified. The diminished right ICA terminates in the MCA. The right M1 and the MCA bifurcation are patent but with decreased flow. IMPRESSION: 1. Positive for widely scattered acute to subacute infarcts in the Right MCA territory with corresponding decreased flow in the Right ICA such as due to high-grade carotid stenosis in the neck. The right M1 and MCA bifurcation appear to remain patent. 2. Scattered infarct related cytotoxic edema in the right MCA  territory with no associated hemorrhage or mass effect. 3. 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 Several chronic right cerebellar infarcts are also new since that time. 4. Chronic benign right temporal lobe CSF cyst. Chronic small vessel disease. Electronically Signed: By: Genevie Ann M.D. On: 11/03/2019 20:32   US Venous Img Lower Bilateral (DVT)  Result Date: 11/04/2019 CLINICAL DATA:  85 year old female with recent multifocal right MCA territory infarct. Possible PE. EXAM: BILATERAL LOWER EXTREMITY VENOUS DOPPLER ULTRASOUND TECHNIQUE: Gray-scale sonography with graded compression, as well as color Doppler and duplex ultrasound were performed to evaluate the lower extremity deep venous systems from the level of the common femoral vein and including the common femoral, femoral, profunda femoral, popliteal and calf veins including the posterior tibial, peroneal and gastrocnemius veins when visible. The superficial great saphenous vein was also interrogated. Spectral Doppler was utilized to evaluate flow at rest and with distal augmentation maneuvers in the common femoral, femoral and popliteal veins. COMPARISON:  None. FINDINGS: RIGHT LOWER EXTREMITY Common Femoral Vein: No evidence of thrombus. Normal compressibility, respiratory phasicity and response to augmentation. Saphenofemoral Junction: No evidence of thrombus. Normal compressibility and flow on color Doppler imaging. Profunda Femoral Vein: No evidence of thrombus. Normal compressibility and flow on color Doppler imaging. Femoral Vein: No evidence of thrombus. Normal compressibility, respiratory phasicity and response to augmentation. Popliteal Vein: No  evidence of thrombus. Normal compressibility, respiratory phasicity and response to augmentation. Calf Veins: No evidence of thrombus. Normal compressibility and flow on color Doppler imaging. Other Findings:  None.  LEFT LOWER EXTREMITY Common Femoral Vein: No evidence of thrombus. Normal compressibility, respiratory phasicity and response to augmentation. Saphenofemoral Junction: No evidence of thrombus. Normal compressibility and flow on color Doppler imaging. Profunda Femoral Vein: No evidence of thrombus. Normal compressibility and flow on color Doppler imaging. Femoral Vein: No evidence of thrombus. Normal compressibility, respiratory phasicity and response to augmentation. Popliteal Vein: No evidence of thrombus. Normal compressibility, respiratory phasicity and response to augmentation. Calf Veins: No evidence of thrombus. Normal compressibility and flow on color Doppler imaging. Other Findings:  None. IMPRESSION: No evidence of bilateral lower extremity deep venous thrombosis. Electronically Signed   By: Genevie Ann M.D.   On: 11/04/2019 18:06   DG Chest Portable 1 View  Result Date: 11/03/2019 CLINICAL DATA:  Weakness and decreased oral intake EXAM: PORTABLE CHEST 1 VIEW COMPARISON:  07/04/2013 FINDINGS: The heart size and mediastinal contours are within normal limits. Both lungs are clear. The visualized skeletal structures are unremarkable. IMPRESSION: No active disease. Electronically Signed   By: Ulyses Jarred M.D.   On: 11/03/2019 18:55   ECHOCARDIOGRAM COMPLETE  Result Date: 11/04/2019    ECHOCARDIOGRAM REPORT   Patient Name:   MEEYA GOLDIN [facility] Date of Exam: 11/04/2019 Medical Rec #:  [id number]         Height:       66.0 in Accession #:    [id number]        Weight:       140.0 lb Date of Birth:  Dec 16, 1934         BSA:          1.72 m Patient Age:    23 years          BP:           142/63 mmHg Patient Gender: F                 HR:           62 bpm. Exam Location:  [facility] Procedure: 2D Echo Indications:    Stroke 434.91 / I163.9  History:        Patient has no prior history of Echocardiogram examinations.                 Stroke; Risk Factors:Hypertension, Diabetes, Dyslipidemia and                 Former  Smoker. Cancer.  Sonographer:    Leavy Cella RDCS (AE) Referring Phys: [place]  1. Left ventricular ejection fraction, by estimation, is 60 to 65%. The left ventricle has normal function. The left ventricle has no regional wall motion abnormalities. There is mild left ventricular hypertrophy. Left ventricular diastolic parameters are consistent with Grade I diastolic dysfunction (impaired relaxation).  2. Right ventricular systolic function is normal. The right ventricular size is normal. There is normal pulmonary artery systolic pressure. The estimated right ventricular systolic pressure is 63.1 mmHg.  3. Left atrial size was mildly dilated.  4. The mitral valve is grossly normal. Trivial mitral valve regurgitation.  5. The aortic valve is tricuspid. Aortic valve regurgitation is not visualized.  6. The inferior vena cava is normal in size with greater than 50% respiratory variability, suggesting right atrial pressure of 3 mmHg. FINDINGS  Left Ventricle: Left ventricular ejection fraction,  by estimation, is 60 to 65%. The left ventricle has normal function. The left ventricle has no regional wall motion abnormalities. The left ventricular internal cavity size was normal in size. There is  mild left ventricular hypertrophy. Left ventricular diastolic parameters are consistent with Grade I diastolic dysfunction (impaired relaxation). Right Ventricle: The right ventricular size is normal. No increase in right ventricular wall thickness. Right ventricular systolic function is normal. There is normal pulmonary artery systolic pressure. The tricuspid regurgitant velocity is 2.45 m/s, and  with an assumed right atrial pressure of 3 mmHg, the estimated right ventricular systolic pressure is 77.8 mmHg. Left Atrium: Left atrial size was mildly dilated. Right Atrium: Right atrial size was normal in size. Pericardium: There is no evidence of pericardial effusion. Presence of pericardial fat  pad. Mitral Valve: The mitral valve is grossly normal. Mild mitral annular calcification. Trivial mitral valve regurgitation. Tricuspid Valve: The tricuspid valve is grossly normal. Tricuspid valve regurgitation is trivial. Aortic Valve: The aortic valve is tricuspid. Aortic valve regurgitation is not visualized. Pulmonic Valve: The pulmonic valve was grossly normal. Pulmonic valve regurgitation is trivial. Aorta: The aortic root is normal in size and structure. Venous: The inferior vena cava is normal in size with greater than 50% respiratory variability, suggesting right atrial pressure of 3 mmHg. IAS/Shunts: No atrial level shunt detected by color flow Doppler.  LEFT VENTRICLE PLAX 2D LVIDd:         3.77 cm  Diastology LVIDs:         2.60 cm  LV e' lateral:   7.72 cm/s LV PW:         1.09 cm  LV E/e' lateral: 12.4 LV IVS:        1.20 cm  LV e' medial:    6.96 cm/s LVOT diam:     1.80 cm  LV E/e' medial:  13.7 LV SV Index:   21.00 LVOT Area:     2.54 cm  RIGHT VENTRICLE RV S prime:     10.10 cm/s TAPSE (M-mode): 2.0 cm LEFT ATRIUM             Index       RIGHT ATRIUM           Index LA diam:        3.20 cm 1.86 cm/m  RA Area:     10.80 cm LA Vol (A2C):   32.0 ml 18.62 ml/m RA Volume:   20.20 ml  11.75 ml/m LA Vol (A4C):   63.1 ml 36.72 ml/m LA Biplane Vol: 45.5 ml 26.48 ml/m   AORTA Ao Root diam: 2.40 cm MITRAL VALVE                TRICUSPID VALVE MV Area (PHT): 2.24 cm     TR Peak grad:   24.0 mmHg MV Decel Time: 338 msec     TR Vmax:        245.00 cm/s MV E velocity: 95.60 cm/s MV A velocity: 122.00 cm/s  SHUNTS MV E/A ratio:  0.78         Systemic Diam: 1.80 cm Rozann Lesches MD Electronically signed by Rozann Lesches MD Signature Date/Time: 11/04/2019/11:45:00 AM    Final    VAS US CAROTID  Result Date: 11/05/2019 Carotid Arterial Duplex Study Indications:                           CVA. Risk Factors:  Hypertension, hyperlipidemia, Diabetes. Pre-Surgical Evaluation &  Surgical     Stenosis at [facility] only. ICA is normal past Correlation                            the stenosis. Anatomy on the right is                                        within normal limits.Right bifurcation is                                        located near the Hyoid Notch. Performing Technologist: Maudry Mayhew MHA, RDMS, RVT, RDCS  Examination Guidelines: A complete evaluation includes B-mode imaging, spectral Doppler, color Doppler, and power Doppler as needed of all accessible portions of each vessel. Bilateral testing is considered an integral part of a complete examination. Limited examinations for reoccurring indications may be performed as noted.  Right Carotid Findings: +----------+-------+-------+--------+---------------------------------+--------+             PSV     EDV     Stenosis Plaque Description                Comments              cm/s    cm/s                                                         +----------+-------+-------+--------+---------------------------------+--------+  CCA Prox   71      6                smooth and homogeneous                      +----------+-------+-------+--------+---------------------------------+--------+  CCA Distal 94      8                smooth and homogeneous                      +----------+-------+-------+--------+---------------------------------+--------+  ICA Prox   760     201     80-99%   heterogenous                                +----------+-------+-------+--------+---------------------------------+--------+  ICA Mid    240     35                                                           +----------+-------+-------+--------+---------------------------------+--------+  ICA Distal 28      5                                                            +----------+-------+-------+--------+---------------------------------+--------+  ECA        367             >50%     irregular, heterogenous and                                                       calcific                                    +----------+-------+-------+--------+---------------------------------+--------+ +----------+--------+-------+----------------+-------------------+             PSV cm/s EDV cms Describe         Arm Pressure (mmHG)  +----------+--------+-------+----------------+-------------------+  Subclavian 200              Multiphasic, WNL                      +----------+--------+-------+----------------+-------------------+ +---------+--------+---+--------+--+---------+  Vertebral PSV cm/s 100 EDV cm/s 15 Antegrade  +---------+--------+---+--------+--+---------+  Left Carotid Findings: +----------+--------+--------+--------+--------------------------+--------+             PSV cm/s EDV cm/s Stenosis Plaque Description         Comments  +----------+--------+--------+--------+--------------------------+--------+  CCA Prox   135      17                                                     +----------+--------+--------+--------+--------------------------+--------+  CCA Distal 55       10                smooth and heterogenous              +----------+--------+--------+--------+--------------------------+--------+  ICA Prox   84       18       1-39%    heterogenous and irregular           +----------+--------+--------+--------+--------------------------+--------+  ICA Distal 137      32                                                     +----------+--------+--------+--------+--------------------------+--------+  ECA        114                                                             +----------+--------+--------+--------+--------------------------+--------+ +----------+--------+--------+----------------+-------------------+             PSV cm/s EDV cm/s Describe         Arm Pressure (mmHG)  +----------+--------+--------+----------------+-------------------+  Subclavian 199               Multiphasic, WNL                       +----------+--------+--------+----------------+-------------------+ +---------+--------+--+--------+---------+  Vertebral PSV cm/s 45 EDV cm/s Antegrade  +---------+--------+--+--------+---------+  Summary: Right Carotid: Velocities in the right ICA are consistent with a 80-99%                stenosis. The ECA appears >50% stenosed. Left Carotid: Velocities in the left ICA are consistent with a 1-39% stenosis. Vertebrals:  Bilateral vertebral arteries demonstrate antegrade flow. Subclavians: Normal flow hemodynamics were seen in bilateral subclavian              arteries. *See table(s) above for measurements and observations.  Electronically signed by [name] MD on 11/05/2019 at 4:51:35 PM.    Final

## 2019-11-07 ENCOUNTER — Inpatient Hospital Stay (HOSPITAL_COMMUNITY): Payer: PPO | Admitting: Anesthesiology

## 2019-11-07 ENCOUNTER — Encounter (HOSPITAL_COMMUNITY)
Admission: EM | Disposition: A | Discharge status: Home Health | Payer: Self-pay | Source: Home / Self Care | Attending: Internal Medicine

## 2019-11-07 ENCOUNTER — Encounter (HOSPITAL_COMMUNITY): Payer: Self-pay | Admitting: Internal Medicine

## 2019-11-07 DIAGNOSIS — I6521 Occlusion and stenosis of right carotid artery: Secondary | ICD-10-CM

## 2019-11-07 HISTORY — PX: ENDARTERECTOMY: SHX5162

## 2019-11-07 LAB — CBC WITH DIFFERENTIAL/PLATELET
Abs Immature Granulocytes: 0.05 10*3/uL (ref 0.00–0.07)
Basophils Absolute: 0 10*3/uL (ref 0.0–0.1)
Basophils Relative: 0 %
Eosinophils Absolute: 0.1 10*3/uL (ref 0.0–0.5)
Eosinophils Relative: 1 %
HCT: 34 % — ABNORMAL LOW (ref 36.0–46.0)
Hemoglobin: 10.9 g/dL — ABNORMAL LOW (ref 12.0–15.0)
Immature Granulocytes: 1 %
Lymphocytes Relative: 26 %
Lymphs Abs: 2.1 10*3/uL (ref 0.7–4.0)
MCH: 29.9 pg (ref 26.0–34.0)
MCHC: 32.1 g/dL (ref 30.0–36.0)
MCV: 93.2 fL (ref 80.0–100.0)
Monocytes Absolute: 1 10*3/uL (ref 0.1–1.0)
Monocytes Relative: 13 %
Neutro Abs: 4.9 10*3/uL (ref 1.7–7.7)
Neutrophils Relative %: 59 %
Platelets: 282 10*3/uL (ref 150–400)
RBC: 3.65 MIL/uL — ABNORMAL LOW (ref 3.87–5.11)
RDW: 11.6 % (ref 11.5–15.5)
WBC: 8.3 10*3/uL (ref 4.0–10.5)
nRBC: 0 % (ref 0.0–0.2)

## 2019-11-07 LAB — GLUCOSE, CAPILLARY
Glucose-Capillary: 174 mg/dL — ABNORMAL HIGH (ref 70–99)
Glucose-Capillary: 165 mg/dL — ABNORMAL HIGH (ref 70–99)
Glucose-Capillary: 173 mg/dL — ABNORMAL HIGH (ref 70–99)
Glucose-Capillary: 170 mg/dL — ABNORMAL HIGH (ref 70–99)

## 2019-11-07 LAB — COMPREHENSIVE METABOLIC PANEL
ALT: 25 U/L (ref 0–44)
AST: 28 U/L (ref 15–41)
Albumin: 3.2 g/dL — ABNORMAL LOW (ref 3.5–5.0)
Alkaline Phosphatase: 65 U/L (ref 38–126)
Anion gap: 11 (ref 5–15)
BUN: 34 mg/dL — ABNORMAL HIGH (ref 8–23)
CO2: 25 mmol/L (ref 22–32)
Calcium: 9.9 mg/dL (ref 8.9–10.3)
Chloride: 109 mmol/L (ref 98–111)
Creatinine, Ser: 2.41 mg/dL — ABNORMAL HIGH (ref 0.44–1.00)
GFR calc Af Amer: 21 mL/min — ABNORMAL LOW (ref >60)
GFR calc non Af Amer: 18 mL/min — ABNORMAL LOW (ref >60)
Glucose, Bld: 117 mg/dL — ABNORMAL HIGH (ref 70–99)
Potassium: 4.8 mmol/L (ref 3.5–5.1)
Sodium: 145 mmol/L (ref 135–145)
Total Bilirubin: 0.7 mg/dL (ref 0.3–1.2)
Total Protein: 6.7 g/dL (ref 6.5–8.1)

## 2019-11-07 LAB — FERRITIN: Ferritin: 300 ng/mL (ref 11–307)

## 2019-11-07 LAB — C-REACTIVE PROTEIN: CRP: 2 mg/dL — ABNORMAL HIGH (ref <1.0)

## 2019-11-07 LAB — D-DIMER, QUANTITATIVE: D-Dimer, Quant: 2.66 ug/mL-FEU — ABNORMAL HIGH (ref 0.00–0.50)

## 2019-11-07 LAB — POCT ACTIVATED CLOTTING TIME
Activated Clotting Time: 213 seconds
Activated Clotting Time: 230 seconds

## 2019-11-07 SURGERY — ENDARTERECTOMY, CAROTID
Anesthesia: General | Laterality: Right

## 2019-11-07 MED ORDER — ASPIRIN EC 81 MG PO TBEC
81.0000 mg | DELAYED_RELEASE_TABLET | Freq: Every day | ORAL | Status: DC
  Administered 2019-11-08 – 2019-11-09 (×2): 81 mg via ORAL
  Filled 2019-11-07 (×2): qty 1

## 2019-11-07 MED ORDER — MORPHINE SULFATE (PF) 2 MG/ML IV SOLN: 1.0000 mg | INTRAVENOUS | Status: DC | PRN

## 2019-11-07 MED ORDER — ONDANSETRON HCL 4 MG/2ML IJ SOLN
INTRAMUSCULAR | Status: DC | PRN
  Administered 2019-11-07: 4 mg via INTRAVENOUS

## 2019-11-07 MED ORDER — LABETALOL HCL 5 MG/ML IV SOLN
INTRAVENOUS | Status: DC | PRN
  Administered 2019-11-07 (×2): 10 mg via INTRAVENOUS

## 2019-11-07 MED ORDER — LABETALOL HCL 5 MG/ML IV SOLN
10.0000 mg | INTRAVENOUS | Status: AC | PRN
  Administered 2019-11-07 – 2019-11-08 (×4): 10 mg via INTRAVENOUS
  Filled 2019-11-07 (×5): qty 4

## 2019-11-07 MED ORDER — SUGAMMADEX SODIUM 200 MG/2ML IV SOLN
INTRAVENOUS | Status: DC | PRN
  Administered 2019-11-07: 130 mg via INTRAVENOUS

## 2019-11-07 MED ORDER — FENTANYL CITRATE (PF) 250 MCG/5ML IJ SOLN
INTRAMUSCULAR | Status: DC | PRN
  Administered 2019-11-07 (×3): 50 ug via INTRAVENOUS

## 2019-11-07 MED ORDER — DEXAMETHASONE SODIUM PHOSPHATE 10 MG/ML IJ SOLN
INTRAMUSCULAR | Status: DC | PRN
  Administered 2019-11-07: 4 mg via INTRAVENOUS

## 2019-11-07 MED ORDER — ROCURONIUM BROMIDE 10 MG/ML (PF) SYRINGE
PREFILLED_SYRINGE | INTRAVENOUS | Status: DC | PRN
  Administered 2019-11-07: 50 mg via INTRAVENOUS

## 2019-11-07 MED ORDER — PROTAMINE SULFATE 10 MG/ML IV SOLN
INTRAVENOUS | Status: DC | PRN
  Administered 2019-11-07: 30 mg via INTRAVENOUS

## 2019-11-07 MED ORDER — DEXTRAN 40 IN SALINE 10-0.9 % IV SOLN
INTRAVENOUS | Status: AC
  Filled 2019-11-07: qty 500

## 2019-11-07 MED ORDER — SUCCINYLCHOLINE CHLORIDE 200 MG/10ML IV SOSY
PREFILLED_SYRINGE | INTRAVENOUS | Status: AC
  Filled 2019-11-07: qty 10

## 2019-11-07 MED ORDER — SODIUM CHLORIDE 0.9 % IV SOLN: INTRAVENOUS | Status: DC | PRN

## 2019-11-07 MED ORDER — ALUM & MAG HYDROXIDE-SIMETH 200-200-20 MG/5ML PO SUSP: 15.0000 mL | ORAL | Status: DC | PRN

## 2019-11-07 MED ORDER — SODIUM CHLORIDE (PF) 0.9 % IJ SOLN
INTRAMUSCULAR | Status: AC
  Filled 2019-11-07: qty 10

## 2019-11-07 MED ORDER — HYDRALAZINE HCL 20 MG/ML IJ SOLN
5.0000 mg | INTRAMUSCULAR | Status: AC | PRN
  Administered 2019-11-07 – 2019-11-08 (×2): 5 mg via INTRAVENOUS
  Filled 2019-11-07 (×2): qty 1

## 2019-11-07 MED ORDER — METOPROLOL TARTRATE 5 MG/5ML IV SOLN: 2.0000 mg | INTRAVENOUS | Status: DC | PRN

## 2019-11-07 MED ORDER — DOCUSATE SODIUM 100 MG PO CAPS
100.0000 mg | ORAL_CAPSULE | Freq: Every day | ORAL | Status: DC
  Administered 2019-11-08 – 2019-11-09 (×2): 100 mg via ORAL
  Filled 2019-11-07 (×3): qty 1

## 2019-11-07 MED ORDER — EPHEDRINE SULFATE-NACL 50-0.9 MG/10ML-% IV SOSY
PREFILLED_SYRINGE | INTRAVENOUS | Status: DC | PRN
  Administered 2019-11-07: 10 mg via INTRAVENOUS

## 2019-11-07 MED ORDER — LIDOCAINE-EPINEPHRINE 1 %-1:100000 IJ SOLN
INTRAMUSCULAR | Status: AC
  Filled 2019-11-07: qty 1

## 2019-11-07 MED ORDER — LIDOCAINE HCL (PF) 1 % IJ SOLN
INTRAMUSCULAR | Status: AC
  Filled 2019-11-07: qty 30

## 2019-11-07 MED ORDER — SUCCINYLCHOLINE CHLORIDE 200 MG/10ML IV SOSY
PREFILLED_SYRINGE | INTRAVENOUS | Status: DC | PRN
  Administered 2019-11-07: 100 mg via INTRAVENOUS

## 2019-11-07 MED ORDER — FENTANYL CITRATE (PF) 250 MCG/5ML IJ SOLN
INTRAMUSCULAR | Status: AC
  Filled 2019-11-07: qty 5

## 2019-11-07 MED ORDER — ESMOLOL HCL 100 MG/10ML IV SOLN
INTRAVENOUS | Status: AC
  Filled 2019-11-07: qty 10

## 2019-11-07 MED ORDER — 0.9 % SODIUM CHLORIDE (POUR BTL) OPTIME
TOPICAL | Status: DC | PRN
  Administered 2019-11-07 (×3): 1000 mL

## 2019-11-07 MED ORDER — HEPARIN SODIUM (PORCINE) 1000 UNIT/ML IJ SOLN
INTRAMUSCULAR | Status: DC | PRN
  Administered 2019-11-07: 6000 [IU] via INTRAVENOUS
  Administered 2019-11-07: 2000 [IU] via INTRAVENOUS

## 2019-11-07 MED ORDER — PROTAMINE SULFATE 10 MG/ML IV SOLN
INTRAVENOUS | Status: AC
  Filled 2019-11-07: qty 5

## 2019-11-07 MED ORDER — ARTIFICIAL TEARS OPHTHALMIC OINT
TOPICAL_OINTMENT | OPHTHALMIC | Status: AC
  Filled 2019-11-07: qty 3.5

## 2019-11-07 MED ORDER — PHENYLEPHRINE 40 MCG/ML (10ML) SYRINGE FOR IV PUSH (FOR BLOOD PRESSURE SUPPORT)
PREFILLED_SYRINGE | INTRAVENOUS | Status: DC | PRN
  Administered 2019-11-07 (×2): 80 ug via INTRAVENOUS

## 2019-11-07 MED ORDER — DEXTRAN 40 IN SALINE 10-0.9 % IV SOLN
INTRAVENOUS | Status: AC | PRN
  Administered 2019-11-07: 500 mL

## 2019-11-07 MED ORDER — LIDOCAINE 2% (20 MG/ML) 5 ML SYRINGE
INTRAMUSCULAR | Status: DC | PRN
  Administered 2019-11-07: 40 mg via INTRAVENOUS

## 2019-11-07 MED ORDER — PANTOPRAZOLE SODIUM 40 MG PO TBEC
40.0000 mg | DELAYED_RELEASE_TABLET | Freq: Every day | ORAL | Status: DC
  Administered 2019-11-07 – 2019-11-09 (×3): 40 mg via ORAL
  Filled 2019-11-07 (×3): qty 1

## 2019-11-07 MED ORDER — LIDOCAINE-EPINEPHRINE 0.5 %-1:200000 IJ SOLN
INTRAMUSCULAR | Status: DC | PRN
  Administered 2019-11-07: 50 mL

## 2019-11-07 MED ORDER — NITROGLYCERIN IN D5W 200-5 MCG/ML-% IV SOLN
INTRAVENOUS | Status: DC | PRN
  Administered 2019-11-07: 15 ug/min via INTRAVENOUS

## 2019-11-07 MED ORDER — CEFAZOLIN SODIUM 1 G IJ SOLR
INTRAMUSCULAR | Status: AC
  Filled 2019-11-07: qty 20

## 2019-11-07 MED ORDER — PHENOL 1.4 % MT LIQD: 1.0000 | OROMUCOSAL | Status: DC | PRN

## 2019-11-07 MED ORDER — SODIUM CHLORIDE 0.9 % IV SOLN
INTRAVENOUS | Status: AC
  Filled 2019-11-07: qty 1.2

## 2019-11-07 MED ORDER — PROPOFOL 10 MG/ML IV BOLUS
INTRAVENOUS | Status: DC | PRN
  Administered 2019-11-07: 60 mg via INTRAVENOUS

## 2019-11-07 MED ORDER — POTASSIUM CHLORIDE CRYS ER 20 MEQ PO TBCR: 20.0000 meq | EXTENDED_RELEASE_TABLET | Freq: Every day | ORAL | Status: DC | PRN

## 2019-11-07 MED ORDER — MAGNESIUM SULFATE 2 GM/50ML IV SOLN: 2.0000 g | Freq: Every day | INTRAVENOUS | Status: DC | PRN

## 2019-11-07 MED ORDER — CEFAZOLIN SODIUM-DEXTROSE 2-4 GM/100ML-% IV SOLN
2.0000 g | Freq: Two times a day (BID) | INTRAVENOUS | Status: AC
  Administered 2019-11-07: 2 g via INTRAVENOUS
  Filled 2019-11-07: qty 100

## 2019-11-07 MED ORDER — SODIUM CHLORIDE 0.9 % IV SOLN: 500.0000 mL | Freq: Once | INTRAVENOUS | Status: DC | PRN

## 2019-11-07 MED ORDER — ONDANSETRON HCL 4 MG/2ML IJ SOLN: 4.0000 mg | Freq: Four times a day (QID) | INTRAMUSCULAR | Status: DC | PRN

## 2019-11-07 MED ORDER — NITROGLYCERIN 0.2 MG/ML ON CALL CATH LAB
INTRAVENOUS | Status: DC | PRN
  Administered 2019-11-07 (×2): 40 ug via INTRAVENOUS
  Administered 2019-11-07: 80 ug via INTRAVENOUS
  Administered 2019-11-07 (×3): 40 ug via INTRAVENOUS
  Administered 2019-11-07: 80 ug via INTRAVENOUS

## 2019-11-07 MED ORDER — PROPOFOL 10 MG/ML IV BOLUS
INTRAVENOUS | Status: AC
  Filled 2019-11-07: qty 20

## 2019-11-07 MED ORDER — ESMOLOL HCL 100 MG/10ML IV SOLN
INTRAVENOUS | Status: DC | PRN
  Administered 2019-11-07 (×2): 20 mg via INTRAVENOUS
  Administered 2019-11-07: 50 mg via INTRAVENOUS

## 2019-11-07 MED ORDER — LIDOCAINE-EPINEPHRINE 0.5 %-1:200000 IJ SOLN
INTRAMUSCULAR | Status: AC
  Filled 2019-11-07: qty 1

## 2019-11-07 MED ORDER — PHENYLEPHRINE HCL-NACL 10-0.9 MG/250ML-% IV SOLN
INTRAVENOUS | Status: DC | PRN
  Administered 2019-11-07: 20 ug/min via INTRAVENOUS

## 2019-11-07 MED ORDER — OXYCODONE-ACETAMINOPHEN 5-325 MG PO TABS: 1.0000 | ORAL_TABLET | ORAL | Status: DC | PRN

## 2019-11-07 MED ORDER — HEPARIN SODIUM (PORCINE) 1000 UNIT/ML IJ SOLN
INTRAMUSCULAR | Status: AC
  Filled 2019-11-07: qty 1

## 2019-11-07 MED ORDER — SODIUM CHLORIDE 0.9 % IV SOLN: INTRAVENOUS | Status: DC

## 2019-11-07 SURGICAL SUPPLY — 48 items
ADH SKN CLS APL DERMABOND .7 (GAUZE/BANDAGES/DRESSINGS) ×1
BAG DECANTER FOR FLEXI CONT (MISCELLANEOUS) ×3 IMPLANT
CANISTER SUCT 3000ML PPV (MISCELLANEOUS) ×3 IMPLANT
CANNULA VESSEL 3MM 2 BLNT TIP (CANNULA) ×7 IMPLANT
CATH ROBINSON RED A/P 18FR (CATHETERS) ×3 IMPLANT
CLIP VESOCCLUDE MED 24/CT (CLIP) ×3 IMPLANT
CLIP VESOCCLUDE SM WIDE 24/CT (CLIP) ×3 IMPLANT
COVER WAND RF STERILE (DRAPES) ×1 IMPLANT
DERMABOND ADVANCED (GAUZE/BANDAGES/DRESSINGS) ×2
DERMABOND ADVANCED .7 DNX12 (GAUZE/BANDAGES/DRESSINGS) ×1 IMPLANT
DRAIN CHANNEL 15F RND FF W/TCR (WOUND CARE) IMPLANT
ELECT REM PT RETURN 9FT ADLT (ELECTROSURGICAL) ×3
ELECTRODE REM PT RTRN 9FT ADLT (ELECTROSURGICAL) ×1 IMPLANT
EVACUATOR SILICONE 100CC (DRAIN) IMPLANT
GLOVE BIO SURGEON STRL SZ7.5 (GLOVE) ×7 IMPLANT
GLOVE BIOGEL PI IND STRL 6.5 (GLOVE) IMPLANT
GLOVE BIOGEL PI IND STRL 8 (GLOVE) ×1 IMPLANT
GLOVE BIOGEL PI INDICATOR 6.5 (GLOVE) ×4
GLOVE BIOGEL PI INDICATOR 8 (GLOVE) ×2
GLOVE ECLIPSE 6.5 STRL STRAW (GLOVE) ×2 IMPLANT
GLOVE SURG SS PI 6.5 STRL IVOR (GLOVE) ×4 IMPLANT
GOWN STRL NON-REIN LRG LVL3 (GOWN DISPOSABLE) ×4 IMPLANT
GOWN STRL REUS W/ TWL LRG LVL3 (GOWN DISPOSABLE) ×3 IMPLANT
GOWN STRL REUS W/TWL LRG LVL3 (GOWN DISPOSABLE) ×9
KIT BASIN OR (CUSTOM PROCEDURE TRAY) ×3 IMPLANT
KIT SHUNT ARGYLE CAROTID ART 6 (VASCULAR PRODUCTS) ×2 IMPLANT
KIT TURNOVER KIT B (KITS) ×3 IMPLANT
NDL HYPO 25GX1X1/2 BEV (NEEDLE) ×1 IMPLANT
NEEDLE HYPO 25GX1X1/2 BEV (NEEDLE) ×6 IMPLANT
NS IRRIG 1000ML POUR BTL (IV SOLUTION) ×8 IMPLANT
PACK CAROTID (CUSTOM PROCEDURE TRAY) ×3 IMPLANT
PAD ARMBOARD 7.5X6 YLW CONV (MISCELLANEOUS) ×6 IMPLANT
PATCH VASC XENOSURE 1CMX6CM (Vascular Products) ×3 IMPLANT
PATCH VASC XENOSURE 1X6 (Vascular Products) IMPLANT
POSITIONER HEAD DONUT 9IN (MISCELLANEOUS) ×3 IMPLANT
SET WALTER ACTIVATION W/DRAPE (SET/KITS/TRAYS/PACK) IMPLANT
SHUNT CAROTID BYPASS 10 (VASCULAR PRODUCTS) IMPLANT
SHUNT CAROTID BYPASS 12 (VASCULAR PRODUCTS) IMPLANT
SPONGE SURGIFOAM ABS GEL 100 (HEMOSTASIS) IMPLANT
SUT PROLENE 6 0 BV (SUTURE) ×8 IMPLANT
SUT SILK 2 0 PERMA HAND 18 BK (SUTURE) IMPLANT
SUT VIC AB 3-0 SH 27 (SUTURE) ×6
SUT VIC AB 3-0 SH 27X BRD (SUTURE) ×1 IMPLANT
SUT VICRYL 4-0 PS2 18IN ABS (SUTURE) ×3 IMPLANT
SYR 20ML LL LF (SYRINGE) ×3 IMPLANT
SYR CONTROL 10ML LL (SYRINGE) ×5 IMPLANT
TOWEL GREEN STERILE (TOWEL DISPOSABLE) ×3 IMPLANT
WATER STERILE IRR 1000ML POUR (IV SOLUTION) ×3 IMPLANT

## 2019-11-07 NOTE — Op Note (Signed)
NAME: Colleen Parks    MRN: [id number] DOB: Jan 24, 1935    DATE OF OPERATION: 11/07/2019  PREOP DIAGNOSIS:    Symptomatic right carotid stenosis  POSTOP DIAGNOSIS:    Same  PROCEDURE:    Right Carotid Endarterectomy with Bovine Pericardial Patch Angioplasty  SURGEON: Judeth Cornfield. Scot Dock, MD  ASSIST: Risa Grill, PA  ANESTHESIA: General  EBL: min  INDICATIONS:    Colleen Parks is a 85 y.o. female who presented with confusion and was found to have a right brain stroke.  She had multiple scattered infarcts in the little cerebral artery distribution.  She was found to have a tight right carotid stenosis.  Right carotid endarterectomy was recommended in order to lower her risk of future stroke.  FINDINGS:   The plaque extended very low into the common carotid artery and she required a long extensive carotid endarterectomy.  The bifurcation was not high however.  There was a nice tapering the plaque distally and no tacking sutures were required.  TECHNIQUE:   The patient was taken to the operating room using all Covid precautions.  Patient received a general anesthetic.  Arterial line had been placed by anesthesia.  The right neck was prepped and draped in usual sterile fashion.  An incision was made along the anterior border of the sternocleidomastoid and the dissection carried down to the common carotid artery which was dissected free and controlled with a Rummel tourniquet.  The facial vein was divided between 2-0 silk ties.  The internal carotid artery was controlled across the plaque superior thyroid artery and external carotid arteries were controlled.  The patient was heparinized.  ACT was monitored throughout the procedure.  Clamps were then placed on the internal limit, then the external carotid artery.  A longitudinal arteriotomy was made in the common carotid artery and this was extended through the plaque into the internal carotid artery about the plaque.  A 10  shunt was placed into the internal carotid artery backbled and placed into the common carotid artery and secured with Rummel tourniquet.  Flow was reestablished to the shunt.  Flow through the shunt was confirmed with the Doppler.  An endarterectomy plane was established proximally and the plaque was sharply divided.  Eversion endarterectomy was performed of the external carotid artery.  Distally there was a good tapering the plaque and no tacking sutures were required.  The artery was irrigated copiously dextran and all loose debris removed.  A bovine pericardial patch was then sewn using continuous 6-0 Prolene suture.  Prior to completing the patch closure the shunt was removed.  The arteries were backbled and flushed appropriately and the anastomosis completed.  Flow was reestablished first to the external carotid artery and into the internal carotid artery.  Was a good pulse distal to the patch and good Doppler signal in the internal carotid artery.  There was no significant flow in the external carotid arteries and I suspected a small dissection.  The vessel was clamped proximally and distally and a small transverse arteriotomy.  The plaque was removed and then the arteriotomy closed with running 6-0 Prolene suture.  The completion there was a good signal in the external carotid artery.  The heparin was partially reversed with protamine.  Hemostasis was obtained in the wound.  The wound was closed with a deep layer of 3-0 Vicryl, the platysma was closed with running 3-0 Vicryl, the skin was closed with 4-0 Vicryl.  Dermabond was applied.  The patient awoke.  She was moving all extremities with left upper extremity weakness which she had preoperatively.  All needle and sponge counts were correct.  [name], MD, FACS [facility]  DATE OF DICTATION:   11/07/2019

## 2019-11-07 NOTE — Progress Notes (Signed)
OT Cancellation Note  Patient Details Name: ADLEY CASTELLO MRN: [id number] DOB: [birth year]   Cancelled Treatment:    Reason Eval/Treat Not Completed: Patient at procedure or test/ unavailable  Layla Maw 11/07/2019, 2:31 PM

## 2019-11-07 NOTE — Progress Notes (Signed)
PT Cancellation Note  Patient Details Name: Colleen Parks MRN: [id number] DOB: 04-03-1935   Cancelled Treatment:    Reason Eval/Treat Not Completed: (P) Patient at procedure or test/unavailable Pt is off floor for endartecetomy. PT will follow back this afternoon as able.   Brinson Tozzi B. Migdalia Dk PT, DPT Acute Rehabilitation Services Pager [phone] Office [phone]  [place] 11/07/2019, 11:22 AM

## 2019-11-07 NOTE — Progress Notes (Signed)
11/07/2019 1200 Received pt to room 4E-24 from PACU.  Pt is S/P R Carotid.  A&Ox4, no C/O pain.  Neuro intact x for L sided weakness and slight L facial droop.  Tele monitor applied and CCMD notified.  CHG bath given.  Oriented to room, call light and bed.  Call bell in reach.   [place]

## 2019-11-07 NOTE — Anesthesia Postprocedure Evaluation (Signed)
Anesthesia Post Note  Patient: Colleen Parks  Procedure(s) Performed: RIGHT ENDARTERECTOMY CAROTID (Right )     Patient location during evaluation: PACU Anesthesia Type: General Level of consciousness: awake and alert Pain management: pain level controlled Vital Signs Assessment: post-procedure vital signs reviewed and stable Respiratory status: spontaneous breathing, nonlabored ventilation, respiratory function stable and patient connected to nasal cannula oxygen Cardiovascular status: blood pressure returned to baseline and stable Postop Assessment: no apparent nausea or vomiting Anesthetic complications: no    Last Vitals:  Vitals:   11/07/19 1115 11/07/19 1203  BP: (!) 129/49 (!) 146/64  Pulse: 68 68  Resp:    Temp:  36.8 C  SpO2: 99% 99%    Last Pain:  Vitals:   11/07/19 1203  TempSrc: Oral  PainSc: 0-No pain                 [name]

## 2019-11-07 NOTE — Progress Notes (Signed)
PHARMACY NOTE:  ANTIMICROBIAL RENAL DOSAGE ADJUSTMENT  Current antimicrobial regimen includes a mismatch between antimicrobial dosage and estimated renal function.  As per policy approved by the Pharmacy & Therapeutics and Medical Executive Committees, the antimicrobial dosage will be adjusted accordingly.  Current antimicrobial dosage:  ancef 1gm IV q8h x2 doses  Indication: surgical prophylaxis  Renal Function:  Estimated Creatinine Clearance: 16.8 mL/min (A) (by C-G formula based on SCr of 2.41 mg/dL (H)).     Antimicrobial dosage has been changed to:  Ancef 1gm IV q12h x1 dose  Additional comments:   Thank you for allowing pharmacy to be a part of this patient's care.  Hildred Laser, PharmD Clinical Pharmacist **Pharmacist phone directory can now be found on [email or web address] ([email or web address]).  Listed under [place].

## 2019-11-07 NOTE — Anesthesia Procedure Notes (Signed)
Procedure Name: Intubation Date/Time: 11/07/2019 8:07 AM Performed by: Renato Shin, CRNA Pre-anesthesia Checklist: Patient identified, Emergency Drugs available, Suction available and Patient being monitored Patient Re-evaluated:Patient Re-evaluated prior to induction Oxygen Delivery Method: Circle system utilized Preoxygenation: Pre-oxygenation with 100% oxygen Induction Type: IV induction and Rapid sequence Laryngoscope Size: Miller and 2 Grade View: Grade I Tube type: Oral Tube size: 7.0 mm Number of attempts: 1 Airway Equipment and Method: Stylet and Oral airway Placement Confirmation: ETT inserted through vocal cords under direct vision,  positive ETCO2 and breath sounds checked- equal and bilateral Secured at: 22 cm Tube secured with: Tape Dental Injury: Teeth and Oropharynx as per pre-operative assessment

## 2019-11-07 NOTE — Progress Notes (Signed)
PROGRESS NOTE                                                                                                                                                                                                             Patient Demographics:    Colleen Parks, is a 85 y.o. female, DOB - 09-30-1934, XBD:[id number]  Outpatient Primary MD for the patient is Lemmie Evens, MD   Admit date - 11/03/2019   LOS - 4  Chief Complaint  Patient presents with  . Weakness       Brief Narrative: Patient is a 85 y.o. 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  Transferred from [facility] to [facility] for neurology evaluation.  See below for further details.   Subjective:   Seen in 4 E.she is s/p carotid endarterectomy-she is awake-alert.  She has less left sided neglect today.  She is moving all 4 extremities.  She is able to answer all my questions appropriately-denies any chest pain/shortness of breath.   Assessment  & Plan :   Acute Right MCA stroke: Setting of right ICA high-grade stenosis-has slight weakness in left upper extremity compared to left lower extremity.  A1c 7.5, LDL 44, echo without any embolic source-telemetry negative for A. fib.  Continue aspirin-she underwent right carotid endarterectomy on 2/22.  Neurology has signed off, [facility] following.  COVID-19 infection: Appears asymptomatic-follow inflammatory markers.  Chest x-ray without pneumonia.  On room air this morning.  Fever: afebrile  O2 requirements:  SpO2: 99 % O2 Flow Rate (L/min): 2 L/min   COVID-19 Labs: Recent Labs    11/04/19 1451 11/04/19 1451 11/05/19 0936 11/06/19 0500 11/07/19 0311  DDIMER 2.95*   < > 2.12* 1.84* 2.66*  FERRITIN 253   < > 278 283 300  LDH 151  --    --   --   --   CRP 2.7*   < > 2.3* 1.7* 2.0*   < > = values in this interval not displayed.    No results found for: BNP  No results for input(s): PROCALCITON in the last 168 hours.  Lab Results  Component Value Date   [place] (A) 11/03/2019     COVID-19  Medications: None  DVT Prophylaxis  :  Heparin  Mildly elevated D-dimer: Not hypoxic-lower extremity Doppler negative-doubt requires any further work-up in the absence of hypoxemia.  AKI on CKD stage IV: Improving with supportive care-euvolemic on exam-continue to avoid nephrotoxic agents.   Acute metabolic encephalopathy: In the setting of CVA-and left-sided neglect-seems reasonably awake and alert this morning-supportive care.  History of left carotid stenosis s/p left CEA in 2018  History of ANCA/glomerulonephritis: Not on any immunosuppressive's for the past several years-follows with nephrology in the outpatient setting.  HLD: On Repatha infusion as outpatient-on fenofibrate as well.  LDL as above.  HTN: Hold all antihypertensives-allow permissive hypertension in the setting of CVA with high-grade ICA stenosis.  DM-2 (A1c 7.5): CBG stable with SSI  CBG (last 3)  Recent Labs    11/06/19 2103 11/07/19 1046 11/07/19 1207  GLUCAP 122* 174* 165*    RN pressure injury documentation: Pressure Injury 11/04/19 Sacrum Right;Left Stage 1 -  Intact skin with non-blanchable redness of a localized area usually over a bony prominence. (Active)  11/04/19 2311  Location: Sacrum  Location Orientation: Right;Left  Staging: Stage 1 -  Intact skin with non-blanchable redness of a localized area usually over a bony prominence.  Wound Description (Comments):   Present on Admission: Yes    Consults  : Neurology  Procedures  :  None  ABG: No results found for: PHART, PCO2ART, PO2ART, HCO3, TCO2, ACIDBASEDEF, O2SAT  Vent Settings: N/A  Condition -  Guarded  Family Communication  :  Daughter updated over the  phone 2/22  Code Status :  Full Code  Diet :  Diet Order            Diet heart healthy/carb modified Room service appropriate? Yes; Fluid consistency: Thin  Diet effective now               Disposition Plan  :  Remain hospitalized-probably home with home health services on 2/23 or 2/24 depending on how she does.  Barriers to discharge: CVA work-up/neurology evaluation in progress  Antimicorbials  :    Anti-infectives (From admission, onward)   Start     Dose/Rate Route Frequency Ordered Stop   11/07/19 2000  ceFAZolin (ANCEF) IVPB 2g/100 mL premix     2 g 200 mL/hr over 30 Minutes Intravenous Every 12 hours 11/07/19 1156 11/08/19 0759   11/07/19 0700  ceFAZolin (ANCEF) IVPB 2g/100 mL premix    Note to Pharmacy: Send with pt to OR   2 g 200 mL/hr over 30 Minutes Intravenous On call 11/06/19 0752 11/07/19 0845      Inpatient Medications  Scheduled Meds: . vitamin C  500 mg Oral Daily  . [START ON 11/08/2019] aspirin EC  81 mg Oral Q0600  . [START ON 11/08/2019] docusate sodium  100 mg Oral Daily  . fenofibrate  54 mg Oral Daily  . folic acid  1 mg Oral Daily  . insulin aspart  0-6 Units Subcutaneous TID WC  . multivitamin with minerals  1 tablet Oral Daily  . pantoprazole  40 mg Oral Daily  . thiamine  100 mg Oral Daily  . zinc sulfate  220 mg Oral Daily   Continuous Infusions: . sodium chloride    . sodium chloride    .  ceFAZolin (ANCEF) IV    . magnesium sulfate bolus IVPB     PRN Meds:.sodium chloride, acetaminophen **OR** acetaminophen (TYLENOL) oral liquid 160 mg/5 mL **OR** acetaminophen, alum & mag hydroxide-simeth, chlorpheniramine-HYDROcodone,  guaiFENesin-dextromethorphan, hydrALAZINE, labetalol, magnesium sulfate bolus IVPB, metoprolol tartrate, morphine injection, ondansetron, oxyCODONE-acetaminophen, phenol, potassium chloride, senna-docusate   Time Spent in minutes  25  See all Orders from today for further details   Oren Binet M.D on  11/07/2019 at 2:48 PM  To page go to [email or web address] - use universal password  [facility] -  Office  [phone]    Objective:   Vitals:   11/07/19 1055 11/07/19 1110 11/07/19 1115 11/07/19 1203  BP: (!) 134/52 (!) 129/49 (!) 129/49 (!) 146/64  Pulse: 69 67 68 68  Resp: 15 15    Temp: 97.8 F (36.6 C) 97.8 F (36.6 C)  98.2 F (36.8 C)  TempSrc:    Oral  SpO2: 99% 98% 99% 99%  Weight:      Height:        Wt Readings from Last 3 Encounters:  11/04/19 62.5 kg  03/16/19 64 kg  11/17/18 65.2 kg     Intake/Output Summary (Last 24 hours) at 11/07/2019 1448 Last data filed at 11/07/2019 1009 Gross per 24 hour  Intake 1000 ml  Output 425 ml  Net 575 ml     Physical Exam Gen Exam:Alert awake-not in any distress HEENT:atraumatic, normocephalic Chest: B/L clear to auscultation anteriorly CVS:S1S2 regular Abdomen:soft non tender, non distended Extremities:no edema Neurology: Subtle-mild left-sided hemiparesis-less left hemineglect today. Skin: no rash   Data Review:    CBC Recent Labs  Lab 11/03/19 1736 11/05/19 0936 11/06/19 0500 11/07/19 0311  WBC 8.3 8.1 7.5 8.3  HGB 11.2* 10.4* 10.3* 10.9*  HCT 36.4 33.3* 32.7* 34.0*  PLT 318 285 252 282  MCV 96.3 94.1 92.9 93.2  MCH 29.6 29.4 29.3 29.9  MCHC 30.8 31.2 31.5 32.1  RDW 11.6 11.5 11.6 11.6  LYMPHSABS 1.9 1.9 1.9 2.1  MONOABS 0.8 0.7 0.9 1.0  EOSABS 0.0 0.1 0.1 0.1  BASOSABS 0.0 0.0 0.0 0.0    Chemistries  Recent Labs  Lab 11/03/19 1736 11/04/19 0544 11/05/19 0936 11/06/19 0500 11/07/19 0311  NA 143 145 147* 145 145  K 4.8 4.6 4.5 4.4 4.8  CL 108 110 113* 113* 109  CO2 24 24 23 23 25   GLUCOSE 123* 101* 110* 107* 117*  BUN 48* 48* 39* 35* 34*  CREATININE 2.89* 2.94* 2.56* 2.37* 2.41*  CALCIUM 9.9 9.4 9.7 9.4 9.9  AST 22  --  19 24 28   ALT 21  --  17 20 25   ALKPHOS 59  --  55 52 65  BILITOT 0.7  --  0.6 0.7 0.7    ------------------------------------------------------------------------------------------------------------------ No results for input(s): CHOL, HDL, LDLCALC, TRIG, CHOLHDL, LDLDIRECT in the last 72 hours.  Lab Results  Component Value Date   HGBA1C 7.5 (H) 11/03/2019   ------------------------------------------------------------------------------------------------------------------ No results for input(s): TSH, T4TOTAL, T3FREE, THYROIDAB in the last 72 hours.  Invalid input(s): FREET3 ------------------------------------------------------------------------------------------------------------------ Recent Labs    11/06/19 0500 11/07/19 0311  FERRITIN 283 300    Coagulation profile No results for input(s): INR, PROTIME in the last 168 hours.  Recent Labs    11/06/19 0500 11/07/19 0311  DDIMER 1.84* 2.66*    Cardiac Enzymes No results for input(s): CKMB, TROPONINI, MYOGLOBIN in the last 168 hours.  Invalid input(s): CK ------------------------------------------------------------------------------------------------------------------ No results found for: BNP  Micro Results Recent Results (from the past 240 hour(s))  SARS CORONAVIRUS 2 (TAT 6-24 HRS) Nasopharyngeal Nasopharyngeal Swab     Status: Abnormal   Collection Time: 11/03/19  9:22 PM   Specimen: Nasopharyngeal Swab  Result Value Ref Range Status   SARS Coronavirus 2 POSITIVE (A) NEGATIVE Final    Comment: RESULT CALLED TO, READ BACK BY AND VERIFIED WITH: Jani Gravel RN 14:15 11/04/19 ([email or web address]) (NOTE) SARS-CoV-2 target nucleic acids are DETECTED. The SARS-CoV-2 RNA is generally detectable in upper and lower respiratory specimens during the acute phase of infection. Positive results are indicative of the presence of SARS-CoV-2 RNA. Clinical correlation with patient history and other diagnostic information is  necessary to determine patient infection status. Positive results do not rule out bacterial infection  or co-infection with other viruses.  The expected result is Negative. Fact Sheet for Patients: SugarRoll.be Fact Sheet for Healthcare Providers: https://www.woods-mathews.com/ This test is not yet approved or cleared by the [place] FDA and  has been authorized for detection and/or diagnosis of SARS-CoV-2 by FDA under an Emergency Use Authorization (EUA). This EUA will remain  in effect (meaning this test can be used) for th e duration of the COVID-19 declaration under Section 564(b)(1) of the Act, 21 U.S.C. section 360bbb-3(b)(1), unless the authorization is terminated or revoked sooner. Performed at [facility], [place] [address], [place],  [address]     Radiology Reports CT Head Wo Contrast  Result Date: 11/03/2019 CLINICAL DATA:  Ataxia and weakness EXAM: CT HEAD WITHOUT CONTRAST TECHNIQUE: Contiguous axial images were obtained from the base of the skull through the vertex without intravenous contrast. COMPARISON:  None. FINDINGS: Brain: There is an area of hypoattenuation with loss of gray-white differentiation in the right parietal lobe (series 2, image 17). No intracranial hemorrhage or other extra-axial collection. There is periventricular hypoattenuation compatible with chronic microvascular disease. Area of cystic encephalomalacia in the right temporal lobe. Vascular: No abnormal hyperdensity of the major intracranial arteries or dural venous sinuses. No intracranial atherosclerosis. Skull: The visualized skull base, calvarium and extracranial soft tissues are normal. Sinuses/Orbits: No fluid levels or advanced mucosal thickening of the visualized paranasal sinuses. No mastoid or middle ear effusion. The orbits are normal. IMPRESSION: 1. Area of hypoattenuation with loss of gray-white differentiation in the right parietal lobe, concerning for acute to early subacute infarct. MRI may provide better temporal characterization. 2. No  intracranial hemorrhage. 3. Old right temporal lobe infarct and findings of chronic microvascular ischemia. Electronically Signed   By: Ulyses Jarred M.D.   On: 11/03/2019 18:51   MR ANGIO HEAD WO CONTRAST  Addendum Date: 11/03/2019   ADDENDUM REPORT: 11/03/2019 20:41 ADDENDUM: Study discussed by telephone with PA-C TAMMY TRIPLETT on 11/03/2019 at 20:38 hours. Electronically Signed   By: Genevie Ann M.D.   On: 11/03/2019 20:41   Result Date: 11/03/2019 CLINICAL DATA:  85 year old female with ataxia and weakness. Acute to subacute infarct in the right parietal lobe on head CT today. EXAM: MRI HEAD WITHOUT CONTRAST MRA HEAD WITHOUT CONTRAST TECHNIQUE: Multiplanar, multiecho pulse sequences of the brain and surrounding structures were obtained without intravenous contrast. Angiographic images of the head were obtained using MRA technique without contrast. COMPARISON:  Head CT 1829 hours today.  Brain MRI 01/31/2015. FINDINGS: MRI HEAD FINDINGS Brain: Study is intermittently degraded by motion artifact despite repeated imaging attempts. DWI reveals widely scattered cortical and subcortical restricted diffusion in the right hemisphere, with confluent areas of involvement both in the right posterior temporal/aparietal lobe, and also the right anterior corona radiata (series 3 image 37). All of the diffusion abnormality seems limited to the right MCA territory. Both axial and coronal DWI is motion degraded but there is no definite  left hemisphere or posterior fossa diffusion restriction. T2 and FLAIR hyperintensity in keeping with cytotoxic edema in the widely scattered areas of acute involvement. No associated hemorrhage or mass effect. Superimposed chronic right temporal lobe region CSF cyst which is stable to mildly smaller since 2016. No superimposed midline shift, ventriculomegaly, or acute intracranial hemorrhage. Cervicomedullary junction and pituitary are within normal limits. Chronic encephalomalacia in the left  occipital pole is new since 2016. And there are also several small chronic right cerebellar infarcts new from the prior MRI, largest in the SCA territory. There is chronic patchy bilateral white matter T2 and FLAIR hyperintensity which is largely stable. T2 heterogeneity in the deep gray nuclei and pons has not significantly changed. Vascular: Major intracranial vascular flow voids are stable since 2016. The right vertebral artery appears dominant. Skull and upper cervical spine: Upper cervical spine degeneration appears increased since 2016 with up to mild spinal stenosis. Visualized bone marrow signal is within normal limits. Sinuses/Orbits: No acute orbit or paranasal sinus findings. Other: Mastoids are clear. Grossly normal visible internal auditory structures. MRA HEAD FINDINGS Antegrade flow in the posterior circulation. Both distal vertebral arteries appear to remain patent although the right is dominant. Normal right PICA origin. No definite distal vertebral stenosis. Patent vertebrobasilar junction. Patent basilar artery with mild mid basilar stenosis. SCA and right PCA origins are patent. A left posterior communicating artery is present but the left PCA is occluded. Right PCA is mildly irregular but patent. Asymmetric decreased antegrade flow signal in the right ICA siphon compared to the left. Probably artifactual loss of signal in the anterior genu. The distal ICA is patent. Patent left siphon with no definite left ICA stenosis. Patent left ICA terminus. Dominant left and diminutive or absent right ACA A1 segments such that the right ACA is supplied from the left. Normal anterior communicating artery. Visible ACA branches are within normal limits. Left MCA M1 segment bifurcates early. The visible left MCA branches are patent with no high-grade stenosis identified. The diminished right ICA terminates in the MCA. The right M1 and the MCA bifurcation are patent but with decreased flow. IMPRESSION: 1.  Positive for widely scattered acute to subacute infarcts in the Right MCA territory with corresponding decreased flow in the Right ICA such as due to high-grade carotid stenosis in the neck. The right M1 and MCA bifurcation appear to remain patent. 2. Scattered infarct related cytotoxic edema in the right MCA territory with no associated hemorrhage or mass effect. 3. 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 Several chronic right cerebellar infarcts are also new since that time. 4. Chronic benign right temporal lobe CSF cyst. Chronic small vessel disease. Electronically Signed: By: Genevie Ann M.D. On: 11/03/2019 20:32   MR BRAIN WO CONTRAST  Addendum Date: 11/03/2019   ADDENDUM REPORT: 11/03/2019 20:41 ADDENDUM: Study discussed by telephone with PA-C TAMMY TRIPLETT on 11/03/2019 at 20:38 hours. Electronically Signed   By: Genevie Ann M.D.   On: 11/03/2019 20:41   Result Date: 11/03/2019 CLINICAL DATA:  85 year old female with ataxia and weakness. Acute to subacute infarct in the right parietal lobe on head CT today. EXAM: MRI HEAD WITHOUT CONTRAST MRA HEAD WITHOUT CONTRAST TECHNIQUE: Multiplanar, multiecho pulse sequences of the brain and surrounding structures were obtained without intravenous contrast. Angiographic images of the head were obtained using MRA technique without contrast. COMPARISON:  Head CT 1829 hours today.  Brain MRI 01/31/2015. FINDINGS:  MRI HEAD FINDINGS Brain: Study is intermittently degraded by motion artifact despite repeated imaging attempts. DWI reveals widely scattered cortical and subcortical restricted diffusion in the right hemisphere, with confluent areas of involvement both in the right posterior temporal/aparietal lobe, and also the right anterior corona radiata (series 3 image 37). All of the diffusion abnormality seems limited to the right MCA territory. Both axial and coronal DWI is motion  degraded but there is no definite left hemisphere or posterior fossa diffusion restriction. T2 and FLAIR hyperintensity in keeping with cytotoxic edema in the widely scattered areas of acute involvement. No associated hemorrhage or mass effect. Superimposed chronic right temporal lobe region CSF cyst which is stable to mildly smaller since 2016. No superimposed midline shift, ventriculomegaly, or acute intracranial hemorrhage. Cervicomedullary junction and pituitary are within normal limits. Chronic encephalomalacia in the left occipital pole is new since 2016. And there are also several small chronic right cerebellar infarcts new from the prior MRI, largest in the SCA territory. There is chronic patchy bilateral white matter T2 and FLAIR hyperintensity which is largely stable. T2 heterogeneity in the deep gray nuclei and pons has not significantly changed. Vascular: Major intracranial vascular flow voids are stable since 2016. The right vertebral artery appears dominant. Skull and upper cervical spine: Upper cervical spine degeneration appears increased since 2016 with up to mild spinal stenosis. Visualized bone marrow signal is within normal limits. Sinuses/Orbits: No acute orbit or paranasal sinus findings. Other: Mastoids are clear. Grossly normal visible internal auditory structures. MRA HEAD FINDINGS Antegrade flow in the posterior circulation. Both distal vertebral arteries appear to remain patent although the right is dominant. Normal right PICA origin. No definite distal vertebral stenosis. Patent vertebrobasilar junction. Patent basilar artery with mild mid basilar stenosis. SCA and right PCA origins are patent. A left posterior communicating artery is present but the left PCA is occluded. Right PCA is mildly irregular but patent. Asymmetric decreased antegrade flow signal in the right ICA siphon compared to the left. Probably artifactual loss of signal in the anterior genu. The distal ICA is patent.  Patent left siphon with no definite left ICA stenosis. Patent left ICA terminus. Dominant left and diminutive or absent right ACA A1 segments such that the right ACA is supplied from the left. Normal anterior communicating artery. Visible ACA branches are within normal limits. Left MCA M1 segment bifurcates early. The visible left MCA branches are patent with no high-grade stenosis identified. The diminished right ICA terminates in the MCA. The right M1 and the MCA bifurcation are patent but with decreased flow. IMPRESSION: 1. Positive for widely scattered acute to subacute infarcts in the Right MCA territory with corresponding decreased flow in the Right ICA such as due to high-grade carotid stenosis in the neck. The right M1 and MCA bifurcation appear to remain patent. 2. Scattered infarct related cytotoxic edema in the right MCA territory with no associated hemorrhage or mass effect. 3. 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 Several chronic right cerebellar infarcts are also new since that time. 4. Chronic benign right temporal lobe CSF cyst. Chronic small vessel disease. Electronically Signed: By: Genevie Ann M.D. On: 11/03/2019 20:32   US Venous Img Lower Bilateral (DVT)  Result Date: 11/04/2019 CLINICAL DATA:  85 year old female with recent multifocal right MCA territory infarct. Possible PE. EXAM: BILATERAL LOWER EXTREMITY VENOUS DOPPLER ULTRASOUND TECHNIQUE: Gray-scale sonography with graded compression, as well as color Doppler  and duplex ultrasound were performed to evaluate the lower extremity deep venous systems from the level of the common femoral vein and including the common femoral, femoral, profunda femoral, popliteal and calf veins including the posterior tibial, peroneal and gastrocnemius veins when visible. The superficial great saphenous vein was also interrogated. Spectral Doppler was utilized to  evaluate flow at rest and with distal augmentation maneuvers in the common femoral, femoral and popliteal veins. COMPARISON:  None. FINDINGS: RIGHT LOWER EXTREMITY Common Femoral Vein: No evidence of thrombus. Normal compressibility, respiratory phasicity and response to augmentation. Saphenofemoral Junction: No evidence of thrombus. Normal compressibility and flow on color Doppler imaging. Profunda Femoral Vein: No evidence of thrombus. Normal compressibility and flow on color Doppler imaging. Femoral Vein: No evidence of thrombus. Normal compressibility, respiratory phasicity and response to augmentation. Popliteal Vein: No evidence of thrombus. Normal compressibility, respiratory phasicity and response to augmentation. Calf Veins: No evidence of thrombus. Normal compressibility and flow on color Doppler imaging. Other Findings:  None. LEFT LOWER EXTREMITY Common Femoral Vein: No evidence of thrombus. Normal compressibility, respiratory phasicity and response to augmentation. Saphenofemoral Junction: No evidence of thrombus. Normal compressibility and flow on color Doppler imaging. Profunda Femoral Vein: No evidence of thrombus. Normal compressibility and flow on color Doppler imaging. Femoral Vein: No evidence of thrombus. Normal compressibility, respiratory phasicity and response to augmentation. Popliteal Vein: No evidence of thrombus. Normal compressibility, respiratory phasicity and response to augmentation. Calf Veins: No evidence of thrombus. Normal compressibility and flow on color Doppler imaging. Other Findings:  None. IMPRESSION: No evidence of bilateral lower extremity deep venous thrombosis. Electronically Signed   By: Genevie Ann M.D.   On: 11/04/2019 18:06   DG Chest Portable 1 View  Result Date: 11/03/2019 CLINICAL DATA:  Weakness and decreased oral intake EXAM: PORTABLE CHEST 1 VIEW COMPARISON:  07/04/2013 FINDINGS: The heart size and mediastinal contours are within normal limits. Both lungs are  clear. The visualized skeletal structures are unremarkable. IMPRESSION: No active disease. Electronically Signed   By: Ulyses Jarred M.D.   On: 11/03/2019 18:55   ECHOCARDIOGRAM COMPLETE  Result Date: 11/04/2019    ECHOCARDIOGRAM REPORT   Patient Name:   BRANTLEY NASER [facility] Date of Exam: 11/04/2019 Medical Rec #:  [id number]         Height:       66.0 in Accession #:    [id number]        Weight:       140.0 lb Date of Birth:  15-Jul-1935         BSA:          1.72 m Patient Age:    21 years          BP:           142/63 mmHg Patient Gender: F                 HR:           62 bpm. Exam Location:  [facility] Procedure: 2D Echo Indications:    Stroke 434.91 / I163.9  History:        Patient has no prior history of Echocardiogram examinations.                 Stroke; Risk Factors:Hypertension, Diabetes, Dyslipidemia and                 Former Smoker. Cancer.  Sonographer:    Leavy Cella RDCS (AE) Referring Phys: [place]  IMPRESSIONS  1. Left ventricular ejection fraction, by estimation, is 60 to 65%. The left ventricle has normal function. The left ventricle has no regional wall motion abnormalities. There is mild left ventricular hypertrophy. Left ventricular diastolic parameters are consistent with Grade I diastolic dysfunction (impaired relaxation).  2. Right ventricular systolic function is normal. The right ventricular size is normal. There is normal pulmonary artery systolic pressure. The estimated right ventricular systolic pressure is 25.0 mmHg.  3. Left atrial size was mildly dilated.  4. The mitral valve is grossly normal. Trivial mitral valve regurgitation.  5. The aortic valve is tricuspid. Aortic valve regurgitation is not visualized.  6. The inferior vena cava is normal in size with greater than 50% respiratory variability, suggesting right atrial pressure of 3 mmHg. FINDINGS  Left Ventricle: Left ventricular ejection fraction, by estimation, is 60 to 65%. The left ventricle has normal  function. The left ventricle has no regional wall motion abnormalities. The left ventricular internal cavity size was normal in size. There is  mild left ventricular hypertrophy. Left ventricular diastolic parameters are consistent with Grade I diastolic dysfunction (impaired relaxation). Right Ventricle: The right ventricular size is normal. No increase in right ventricular wall thickness. Right ventricular systolic function is normal. There is normal pulmonary artery systolic pressure. The tricuspid regurgitant velocity is 2.45 m/s, and  with an assumed right atrial pressure of 3 mmHg, the estimated right ventricular systolic pressure is 53.9 mmHg. Left Atrium: Left atrial size was mildly dilated. Right Atrium: Right atrial size was normal in size. Pericardium: There is no evidence of pericardial effusion. Presence of pericardial fat pad. Mitral Valve: The mitral valve is grossly normal. Mild mitral annular calcification. Trivial mitral valve regurgitation. Tricuspid Valve: The tricuspid valve is grossly normal. Tricuspid valve regurgitation is trivial. Aortic Valve: The aortic valve is tricuspid. Aortic valve regurgitation is not visualized. Pulmonic Valve: The pulmonic valve was grossly normal. Pulmonic valve regurgitation is trivial. Aorta: The aortic root is normal in size and structure. Venous: The inferior vena cava is normal in size with greater than 50% respiratory variability, suggesting right atrial pressure of 3 mmHg. IAS/Shunts: No atrial level shunt detected by color flow Doppler.  LEFT VENTRICLE PLAX 2D LVIDd:         3.77 cm  Diastology LVIDs:         2.60 cm  LV e' lateral:   7.72 cm/s LV PW:         1.09 cm  LV E/e' lateral: 12.4 LV IVS:        1.20 cm  LV e' medial:    6.96 cm/s LVOT diam:     1.80 cm  LV E/e' medial:  13.7 LV SV Index:   21.00 LVOT Area:     2.54 cm  RIGHT VENTRICLE RV S prime:     10.10 cm/s TAPSE (M-mode): 2.0 cm LEFT ATRIUM             Index       RIGHT ATRIUM            Index LA diam:        3.20 cm 1.86 cm/m  RA Area:     10.80 cm LA Vol (A2C):   32.0 ml 18.62 ml/m RA Volume:   20.20 ml  11.75 ml/m LA Vol (A4C):   63.1 ml 36.72 ml/m LA Biplane Vol: 45.5 ml 26.48 ml/m   AORTA Ao Root diam: 2.40 cm MITRAL VALVE  TRICUSPID VALVE MV Area (PHT): 2.24 cm     TR Peak grad:   24.0 mmHg MV Decel Time: 338 msec     TR Vmax:        245.00 cm/s MV E velocity: 95.60 cm/s MV A velocity: 122.00 cm/s  SHUNTS MV E/A ratio:  0.78         Systemic Diam: 1.80 cm Rozann Lesches MD Electronically signed by Rozann Lesches MD Signature Date/Time: 11/04/2019/11:45:00 AM    Final    VAS US CAROTID  Result Date: 11/05/2019 Carotid Arterial Duplex Study Indications:                           CVA. Risk Factors:                          Hypertension, hyperlipidemia, Diabetes. Pre-Surgical Evaluation & Surgical     Stenosis at [facility] only. ICA is normal past Correlation                            the stenosis. Anatomy on the right is                                        within normal limits.Right bifurcation is                                        located near the Hyoid Notch. Performing Technologist: Maudry Mayhew MHA, RDMS, RVT, RDCS  Examination Guidelines: A complete evaluation includes B-mode imaging, spectral Doppler, color Doppler, and power Doppler as needed of all accessible portions of each vessel. Bilateral testing is considered an integral part of a complete examination. Limited examinations for reoccurring indications may be performed as noted.  Right Carotid Findings: +----------+-------+-------+--------+---------------------------------+--------+           PSV    EDV    StenosisPlaque Description               Comments           cm/s   cm/s                                                     +----------+-------+-------+--------+---------------------------------+--------+ CCA Prox  71     6              smooth and homogeneous                     +----------+-------+-------+--------+---------------------------------+--------+ CCA Distal94     8              smooth and homogeneous                    +----------+-------+-------+--------+---------------------------------+--------+ ICA Prox  760    201    80-99%  heterogenous                              +----------+-------+-------+--------+---------------------------------+--------+ ICA Mid   240    35                                                       +----------+-------+-------+--------+---------------------------------+--------+  ICA Distal28     5                                                        +----------+-------+-------+--------+---------------------------------+--------+ ECA       367           >50%    irregular, heterogenous and                                               calcific                                  +----------+-------+-------+--------+---------------------------------+--------+ +----------+--------+-------+----------------+-------------------+           PSV cm/sEDV cmsDescribe        Arm Pressure (mmHG) +----------+--------+-------+----------------+-------------------+ Subclavian200            Multiphasic, WNL                    +----------+--------+-------+----------------+-------------------+ +---------+--------+---+--------+--+---------+ VertebralPSV cm/s100EDV cm/s15Antegrade +---------+--------+---+--------+--+---------+  Left Carotid Findings: +----------+--------+--------+--------+--------------------------+--------+           PSV cm/sEDV cm/sStenosisPlaque Description        Comments +----------+--------+--------+--------+--------------------------+--------+ CCA Prox  135     17                                                 +----------+--------+--------+--------+--------------------------+--------+ CCA Distal55      10              smooth and heterogenous             +----------+--------+--------+--------+--------------------------+--------+ ICA Prox  84      18      1-39%   heterogenous and irregular         +----------+--------+--------+--------+--------------------------+--------+ ICA Distal137     32                                                 +----------+--------+--------+--------+--------------------------+--------+ ECA       114                                                        +----------+--------+--------+--------+--------------------------+--------+ +----------+--------+--------+----------------+-------------------+           PSV cm/sEDV cm/sDescribe        Arm Pressure (mmHG) +----------+--------+--------+----------------+-------------------+ [id number]             Multiphasic, WNL                    +----------+--------+--------+----------------+-------------------+ +---------+--------+--+--------+---------+ VertebralPSV cm/s45EDV cm/sAntegrade +---------+--------+--+--------+---------+   Summary: Right Carotid: Velocities in the right ICA are consistent with a 80-99%  stenosis. The ECA appears >50% stenosed. Left Carotid: Velocities in the left ICA are consistent with a 1-39% stenosis. Vertebrals:  Bilateral vertebral arteries demonstrate antegrade flow. Subclavians: Normal flow hemodynamics were seen in bilateral subclavian              arteries. *See table(s) above for measurements and observations.  Electronically signed by [name] MD on 11/05/2019 at 4:51:35 PM.    Final

## 2019-11-07 NOTE — Anesthesia Procedure Notes (Signed)
Arterial Line Insertion Start/End2/22/2021 7:55 AM, 11/07/2019 8:00 AM Performed by: CRNA  Patient location: OR. Preanesthetic checklist: patient identified, IV checked, site marked, risks and benefits discussed, surgical consent, monitors and equipment checked, pre-op evaluation, timeout performed and anesthesia consent Lidocaine 1% used for infiltration Left, radial was placed Catheter size: 20 G Hand hygiene performed , maximum sterile barriers used  and Seldinger technique used Allen's test indicative of satisfactory collateral circulation Attempts: 1 Procedure performed without using ultrasound guided technique. Following insertion, dressing applied and Biopatch. Post procedure assessment: normal  Patient tolerated the procedure well with no immediate complications.

## 2019-11-07 NOTE — Anesthesia Preprocedure Evaluation (Addendum)
Anesthesia Evaluation  Patient identified by MRN, date of birth, ID band Patient awake    Reviewed: Allergy & Precautions, NPO status , Patient's Chart, lab work & pertinent test results  Airway Mallampati: II  TM Distance: >3 FB Neck ROM: Full    Dental  (+) Teeth Intact, Dental Advisory Given   Pulmonary sleep apnea , former smoker,    breath sounds clear to auscultation       Cardiovascular hypertension, Pt. on medications + Peripheral Vascular Disease   Rhythm:Regular Rate:Normal     Neuro/Psych Anxiety CVA    GI/Hepatic Neg liver ROS, GERD  Medicated,  Endo/Other  diabetes  Renal/GU Renal InsufficiencyRenal disease     Musculoskeletal negative musculoskeletal ROS (+)   Abdominal Normal abdominal exam  (+)   Peds  Hematology negative hematology ROS (+)   Anesthesia Other Findings   Reproductive/Obstetrics                            Echo: 1. Left ventricular ejection fraction, by estimation, is 60 to 65%. The  left ventricle has normal function. The left ventricle has no regional  wall motion abnormalities. There is mild left ventricular hypertrophy.  Left ventricular diastolic parameters  are consistent with Grade I diastolic dysfunction (impaired relaxation).  2. Right ventricular systolic function is normal. The right ventricular  size is normal. There is normal pulmonary artery systolic pressure. The  estimated right ventricular systolic pressure is 33.0 mmHg.  3. Left atrial size was mildly dilated.  4. The mitral valve is grossly normal. Trivial mitral valve  regurgitation.  5. The aortic valve is tricuspid. Aortic valve regurgitation is not  visualized.  6. The inferior vena cava is normal in size with greater than 50%  respiratory variability, suggesting right atrial pressure of 3 mmHg.   Anesthesia Physical Anesthesia Plan  ASA: III  Anesthesia Plan: General    Post-op Pain Management:    Induction: Intravenous  PONV Risk Score and Plan: 4 or greater and Ondansetron and Treatment may vary due to age or medical condition  Airway Management Planned: Oral ETT  Additional Equipment: Arterial line  Intra-op Plan:   Post-operative Plan: Extubation in OR  Informed Consent:   Plan Discussed with: CRNA  Anesthesia Plan Comments: (Remi gtt)       Anesthesia Quick Evaluation

## 2019-11-07 NOTE — Interval H&P Note (Signed)
History and Physical Interval Note:  11/07/2019 7:05 AM  Colleen Parks  has presented today for surgery, with the diagnosis of atherosclerosis of carotid.  The various methods of treatment have been discussed with the patient and family. After consideration of risks, benefits and other options for treatment, the patient has consented to  Procedure(s): RIGHT ENDARTERECTOMY CAROTID (Right) as a surgical intervention.  The patient's history has been reviewed, patient examined, no change in status, stable for surgery.  I have reviewed the patient's chart and labs.  Questions were answered to the patient's satisfaction.     [name]

## 2019-11-07 NOTE — Transfer of Care (Signed)
Immediate Anesthesia Transfer of Care Note  Patient: [name]  Procedure(s) Performed: RIGHT ENDARTERECTOMY CAROTID (Right )  Patient Location: PACU  Anesthesia Type:General  Level of Consciousness: awake, alert  and patient cooperative  Airway & Oxygen Therapy: Patient Spontanous Breathing and Patient connected to face mask oxygen  Post-op Assessment: Report given to RN, Post -op Vital signs reviewed and stable and with some left side weakness. Surgeon aware  Post vital signs: Reviewed and stable  Last Vitals:  Vitals Value Taken Time  BP    Temp    Pulse    Resp    SpO2      Last Pain:  Vitals:   11/07/19 0400  TempSrc: Oral  PainSc:       Patients Stated Pain Goal: 0 (90/90/30 1499)  Complications: No apparent anesthesia complications

## 2019-11-07 NOTE — Evaluation (Signed)
Speech Language Pathology Evaluation Patient Details Name: Colleen Parks MRN: [id number] DOB: 07-07-1935 Today's Date: 11/07/2019 Time: 6010-9323 SLP Time Calculation (min) (ACUTE ONLY): 23 min  Problem List:  Patient Active Problem List   Diagnosis Date Noted  . Pressure injury of skin 11/05/2019  . COVID-19 virus infection 11/04/2019  . Carotid stenosis 11/04/2019  . Rt MCA Acute CVA (cerebrovascular accident) ([place]) 11/03/2019  . Asymptomatic stenosis of left carotid artery 11/13/2016  . Arachnoid cyst 08/18/2014  . Glomerulonephritis 08/01/2013  . Hypercholesterolemia   . RAS (renal artery stenosis) ([place])   . Subclavian arterial stenosis ([place])   . Cancer ([place])   . DM (diabetes mellitus) ([place]) 11/19/2011  . MIXED HYPERLIPIDEMIA 08/19/2009  . Essential hypertension, benign 08/19/2009  . Bilateral carotid bruits 08/19/2009   Past Medical History:  Past Medical History:  Diagnosis Date  . ANCA-associated vasculitis ([place])   . Anxiety   . Cancer [facility])     left breast mastectomy  . Diabetes mellitus    no longer on medication  . GERD (gastroesophageal reflux disease)   . Glomerulonephritis   . HTN (hypertension)   . Hypercholesterolemia   . RAS (renal artery stenosis) ([place])   . Sleep apnea    no cpap use  . Subclavian arterial stenosis [facility])    Past Surgical History:  Past Surgical History:  Procedure Laterality Date  . BIOPSY THYROID    . CATARACT EXTRACTION W/PHACO  12/18/2011   Procedure: CATARACT EXTRACTION PHACO AND INTRAOCULAR LENS PLACEMENT (IOC);  Surgeon: Tonny Branch, MD;  Location: [facility] ORS;  Service: Ophthalmology;  Laterality: Right;  CDE:17.77  . CATARACT EXTRACTION W/PHACO  01/26/2012   Procedure: CATARACT EXTRACTION PHACO AND INTRAOCULAR LENS PLACEMENT (IOC);  Surgeon: Tonny Branch, MD;  Location: [facility] ORS;  Service: Ophthalmology;  Laterality: Left;  CDE 15.23  . COLONOSCOPY N/A 05/10/2014   Procedure: COLONOSCOPY;  Surgeon: Rogene Houston, MD;  Location: [facility]  ENDO SUITE;  Service: Endoscopy;  Laterality: N/A;  830  . ENDARTERECTOMY Left 11/13/2016   Procedure: LEFT CAROTID ARTERY ENDARTERECTOMY;  Surgeon: Angelia Mould, MD;  Location: [place];  Service: Vascular;  Laterality: Left;  . left breast mastectomy with chemo  1995   . MASTECTOMY Left   . PATCH ANGIOPLASTY Left 11/13/2016   Procedure: PATCH ANGIOPLASTY OF THE LEFT CAROTID ARTERY USING HEMASHEILD PACTH;  Surgeon: Angelia Mould, MD;  Location: [facility] OR;  Service: Vascular;  Laterality: Left;   HPI:  Colleen Parks is a 85 y.o. female with medical history significant for hypertension, diabetes mellitus, ANCA associated vasculitis, glomerulonephritis, left carotid artery stenosis. Brought to the ED with complaints of progressive generalized weakness, with reduced p.o. intake and confusion over the past 7 days.  Positive Covid test 2/29/21. MRI brain-positive for widely scattered acute to subacute infarcts in the right MCA territory. Found to also gave high grade left carotid artery stenosis and underwent carotid endarterectomy 2/22 morning.    Assessment / Plan / Recommendation Clinical Impression  Pt's left facial/labial ROM is decreased- reports functional sensation. Speech is intelligible with intermittent periods of imprecision. She did not have concerns with her cognition, speech or language. Primary impairments appear to be all aspects of awareness and vision/left inattention. Pt's clock drawing contained numerous errors that pt could not detect. She scored within normal limits on word recall, repetition, orientation, naming. Attention and abstraction were minimally impaired. ST will work with pt while on acute care and recommend follow up on next venue.  SLP Assessment  SLP Recommendation/Assessment: Patient needs continued Speech Lanaguage Pathology Services SLP Visit Diagnosis: Cognitive communication deficit (R41.841)    Follow Up Recommendations  Other (comment)(TBD)     Frequency and Duration min 2x/week  2 weeks      SLP Evaluation Cognition  Overall Cognitive Status: No family/caregiver present to determine baseline cognitive functioning Arousal/Alertness: Awake/alert Orientation Level: Oriented X4 Attention: Sustained Sustained Attention: Appears intact Memory: (within average range on Cognistat word recall) Awareness: Impaired Awareness Impairment: Intellectual impairment;Emergent impairment;Anticipatory impairment Problem Solving: Appears intact(verbal only ) Executive Function: Self Correcting;Self Monitoring Self Monitoring: Impaired Self Correcting: Impaired Safety/Judgment: Other (comment)(due to visual )       Comprehension  Auditory Comprehension Overall Auditory Comprehension: Appears within functional limits for tasks assessed Interfering Components: Visual impairments Visual Recognition/Discrimination Discrimination: Not tested Reading Comprehension Reading Status: Not tested    Expression Verbal Expression Overall Verbal Expression: Appears within functional limits for tasks assessed Repetition: No impairment Pragmatics: No impairment Written Expression Dominant Hand: Left Written Expression: Not tested   Oral / Motor  Oral Motor/Sensory Function Overall Oral Motor/Sensory Function: Mild impairment Facial ROM: Reduced left;Suspected CN VII (facial) dysfunction Facial Symmetry: Abnormal symmetry left;Suspected CN VII (facial) dysfunction Facial Strength: Reduced left;Suspected CN VII (facial) dysfunction Facial Sensation: Within Functional Limits(per pt) Motor Speech Overall Motor Speech: (minimal dysarthria however intelligible) Intelligibility: Intelligible Motor Planning: Witnin functional limits   GO                    Houston Siren 11/07/2019, 2:59 PM  Orbie Pyo Sophiea Ueda M.Ed Risk analyst [phone] Office [phone]

## 2019-11-08 ENCOUNTER — Encounter: Payer: Self-pay | Admitting: *Deleted

## 2019-11-08 LAB — D-DIMER, QUANTITATIVE: D-Dimer, Quant: 2.93 ug/mL-FEU — ABNORMAL HIGH (ref 0.00–0.50)

## 2019-11-08 LAB — GLUCOSE, CAPILLARY
Glucose-Capillary: 110 mg/dL — ABNORMAL HIGH (ref 70–99)
Glucose-Capillary: 121 mg/dL — ABNORMAL HIGH (ref 70–99)
Glucose-Capillary: 128 mg/dL — ABNORMAL HIGH (ref 70–99)
Glucose-Capillary: 127 mg/dL — ABNORMAL HIGH (ref 70–99)

## 2019-11-08 LAB — CBC WITH DIFFERENTIAL/PLATELET
Abs Immature Granulocytes: 0.04 10*3/uL (ref 0.00–0.07)
Basophils Absolute: 0 10*3/uL (ref 0.0–0.1)
Basophils Relative: 0 %
Eosinophils Absolute: 0 10*3/uL (ref 0.0–0.5)
Eosinophils Relative: 0 %
HCT: 30.6 % — ABNORMAL LOW (ref 36.0–46.0)
Hemoglobin: 9.6 g/dL — ABNORMAL LOW (ref 12.0–15.0)
Immature Granulocytes: 0 %
Lymphocytes Relative: 16 %
Lymphs Abs: 1.9 10*3/uL (ref 0.7–4.0)
MCH: 29.6 pg (ref 26.0–34.0)
MCHC: 31.4 g/dL (ref 30.0–36.0)
MCV: 94.4 fL (ref 80.0–100.0)
Monocytes Absolute: 1.3 10*3/uL — ABNORMAL HIGH (ref 0.1–1.0)
Monocytes Relative: 11 %
Neutro Abs: 8.8 10*3/uL — ABNORMAL HIGH (ref 1.7–7.7)
Neutrophils Relative %: 73 %
Platelets: 226 10*3/uL (ref 150–400)
RBC: 3.24 MIL/uL — ABNORMAL LOW (ref 3.87–5.11)
RDW: 11.7 % (ref 11.5–15.5)
WBC: 12.1 10*3/uL — ABNORMAL HIGH (ref 4.0–10.5)
nRBC: 0 % (ref 0.0–0.2)

## 2019-11-08 LAB — COMPREHENSIVE METABOLIC PANEL
ALT: 22 U/L (ref 0–44)
AST: 27 U/L (ref 15–41)
Albumin: 2.9 g/dL — ABNORMAL LOW (ref 3.5–5.0)
Alkaline Phosphatase: 55 U/L (ref 38–126)
Anion gap: 10 (ref 5–15)
BUN: 26 mg/dL — ABNORMAL HIGH (ref 8–23)
CO2: 23 mmol/L (ref 22–32)
Calcium: 9.2 mg/dL (ref 8.9–10.3)
Chloride: 111 mmol/L (ref 98–111)
Creatinine, Ser: 2.28 mg/dL — ABNORMAL HIGH (ref 0.44–1.00)
GFR calc Af Amer: 22 mL/min — ABNORMAL LOW (ref >60)
GFR calc non Af Amer: 19 mL/min — ABNORMAL LOW (ref >60)
Glucose, Bld: 117 mg/dL — ABNORMAL HIGH (ref 70–99)
Potassium: 4.4 mmol/L (ref 3.5–5.1)
Sodium: 144 mmol/L (ref 135–145)
Total Bilirubin: 0.1 mg/dL — ABNORMAL LOW (ref 0.3–1.2)
Total Protein: 6.3 g/dL — ABNORMAL LOW (ref 6.5–8.1)

## 2019-11-08 LAB — C-REACTIVE PROTEIN: CRP: 4.2 mg/dL — ABNORMAL HIGH (ref <1.0)

## 2019-11-08 LAB — FERRITIN: Ferritin: 254 ng/mL (ref 11–307)

## 2019-11-08 MED ORDER — STROKE: EARLY STAGES OF RECOVERY BOOK
Freq: Once | Status: AC
  Filled 2019-11-08: qty 1

## 2019-11-08 MED ORDER — OXYBUTYNIN CHLORIDE ER 5 MG PO TB24: 5.0000 mg | ORAL_TABLET | Freq: Every day | ORAL | Status: DC

## 2019-11-08 MED ORDER — PANTOPRAZOLE SODIUM 40 MG PO TBEC: 40.0000 mg | DELAYED_RELEASE_TABLET | Freq: Every day | ORAL | Status: DC

## 2019-11-08 MED ORDER — ADULT MULTIVITAMIN W/MINERALS CH: 1.0000 | ORAL_TABLET | Freq: Every day | ORAL | Status: DC

## 2019-11-08 MED ORDER — FLUOXETINE HCL 10 MG PO CAPS
10.0000 mg | ORAL_CAPSULE | Freq: Every day | ORAL | Status: DC
  Administered 2019-11-08 – 2019-11-09 (×2): 10 mg via ORAL
  Filled 2019-11-08 (×3): qty 1

## 2019-11-08 MED ORDER — HEPARIN SODIUM (PORCINE) 5000 UNIT/ML IJ SOLN
5000.0000 [IU] | Freq: Three times a day (TID) | INTRAMUSCULAR | Status: DC
  Administered 2019-11-08 – 2019-11-09 (×3): 5000 [IU] via SUBCUTANEOUS
  Filled 2019-11-08 (×3): qty 1

## 2019-11-08 MED ORDER — AMLODIPINE BESYLATE 5 MG PO TABS
5.0000 mg | ORAL_TABLET | Freq: Every day | ORAL | Status: DC
  Administered 2019-11-08 – 2019-11-09 (×2): 5 mg via ORAL
  Filled 2019-11-08 (×2): qty 1

## 2019-11-08 MED ORDER — LABETALOL HCL 5 MG/ML IV SOLN
10.0000 mg | INTRAVENOUS | Status: AC | PRN
  Administered 2019-11-09 (×2): 10 mg via INTRAVENOUS
  Filled 2019-11-08: qty 4

## 2019-11-08 NOTE — Progress Notes (Signed)
Physical Therapy Treatment Patient Details Name: Colleen Parks MRN: [id number] DOB: [birth year] Today's Date: 11/08/2019    History of Present Illness 85 y.o. 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 Pt presented to [facility] on 2/18 with generalized weakness, poor oral intake, confusion for 1 week. Work up at [facility] showed right MCA territory infarct-along with high-grade right ICA stenosis. She was then transferred from [facility] to [facility] for further neurologic care. MRI shows scattered subacute RMCA strokes. + COVID 19 test 11/03/19.  Pt s/p carotid endarectomy on 11/07/19.    PT Comments    Pt was able to ambulate in room with HHA (unable to find cane) for 150'.  She required min cues for navigating around obstacles on her left.  Pt was working on standing balance when had nausea/vomiting and had to return to supine.  Continues to have L inattention and possible L field cut -requiring min cues to scan environment.   Follow Up Recommendations  Home health PT;Supervision/Assistance - 24 hour     Equipment Recommendations  Other (comment)(may consider cane - pt reports she has cane)    Recommendations for Other Services       Precautions / Restrictions Precautions Precautions: Fall Restrictions Weight Bearing Restrictions: No    Mobility  Bed Mobility Overal bed mobility: Needs Assistance Bed Mobility: Sit to Supine     Supine to sit: Supervision Sit to supine: Supervision   General bed mobility comments: required cues for safety and to wait for line/lead management  Transfers Overall transfer level: Needs assistance Equipment used: None Transfers: Sit to/from Stand Sit to Stand: Supervision         General transfer comment: for safety; no imbalance noted  Ambulation/Gait Ambulation/Gait assistance: Min guard Gait Distance (Feet): 150 Feet Assistive device: 1 person hand held assist Gait  Pattern/deviations: Step-through pattern     General Gait Details: Unable to ambulate in hall due to pt not on full COVID unit at this time.  Ambulated in room.  Pt was able to navigate around obstacles in room with HHA and min cues.  She did have difficulty finding items that were on her left and turned completely in a circle until she was able to see on right.   Stairs             Wheelchair Mobility    Modified Rankin (Stroke Patients Only)       Balance Overall balance assessment: Needs assistance   Sitting balance-Leahy Scale: Good       Standing balance-Leahy Scale: Fair Standing balance comment: Worked on balance activities at the sink.  Pt was able to stand with feet together EO and EC without difficulty.  Had mild instability with reaching for items and standing on single leg.  Worked on finding objects on L and reaching for them.  While doing this pt became nauseated and with vomitting and had to return to bed. Checked peripheral vision and pt with decreased field on L compared to R.                             Cognition Arousal/Alertness: Awake/alert   Overall Cognitive Status: No family/caregiver present to determine baseline cognitive functioning  General Comments: alert and oriented but required frequent cues for safety and for L inattention and to scan left      Exercises      General Comments General comments (skin integrity, edema, etc.): Walked oN RA with O2 sats in the 90's.  HR was 70's at rest, 90-100 with activity, up to 117 bpm with vomiting but returned to 70 with rest.  BP was elevated after vomiting to 170/56.  RN notified of elevated BP and emesis.      Pertinent Vitals/Pain Pain Assessment: No/denies pain    Home Living                      Prior Function            PT Goals (current goals can now be found in the care plan section) Progress towards PT goals:  Progressing toward goals    Frequency    Min 4X/week      PT Plan Current plan remains appropriate    Co-evaluation              AM-PAC PT "6 Clicks" Mobility   Outcome Measure  Help needed turning from your back to your side while in a flat bed without using bedrails?: None Help needed moving from lying on your back to sitting on the side of a flat bed without using bedrails?: None Help needed moving to and from a bed to a chair (including a wheelchair)?: None Help needed standing up from a chair using your arms (e.g., wheelchair or bedside chair)?: None Help needed to walk in hospital room?: A Little Help needed climbing 3-5 steps with a railing? : A Little 6 Click Score: 22    End of Session Equipment Utilized During Treatment: Gait belt Activity Tolerance: Other (comment)(limited by nausea and vomiting.) Patient left: in bed;with call bell/phone within reach;with bed alarm set Nurse Communication: Mobility status PT Visit Diagnosis: Other symptoms and signs involving the nervous system (R29.898)     Time: 6010-9323 PT Time Calculation (min) (ACUTE ONLY): 36 min  Charges:  $Gait Training: 8-22 mins $Neuromuscular Re-education: 8-22 mins                     Maggie Font, PT Acute Rehab Services Pager [phone] [facility] [phone] [facility] [place] 11/08/2019, 4:44 PM

## 2019-11-08 NOTE — Progress Notes (Signed)
Patient transferred to 5W-11 from 4E. A&Ox4 at time of transfer. Denies pain and discomfort. Instructed on use of call bell, pt. voices understanding of calling staff before getting out of bed. Placed on telemetry. Skin assessed with Ava, RN and no signs of breakdown noted. Will continue to monitor and treat per MD order.

## 2019-11-08 NOTE — Progress Notes (Addendum)
Patient's BP 178/69 HR 80. Labetalol given per MD order. Will continue to monitor.   1309: BP recheck 129/64 HR 81

## 2019-11-08 NOTE — Progress Notes (Signed)
   VASCULAR SURGERY ASSESSMENT & PLAN:   POD 1 RIGHT CEA: The patient is doing well.  Neurologic exam is at its baseline with minimal left upper extremity weakness.  VASCULAR QUALITY INITIATIVE: She is on aspirin.  She is allergic to statins.  DVT PROPHYLAXIS: I do not see that she is getting DVT prophylaxis so I have started her on subcu heparin.   SUBJECTIVE:   No complaints this morning.  PHYSICAL EXAM:   Vitals:   11/08/19 1057 11/08/19 1200 11/08/19 1221 11/08/19 1250  BP: (!) 165/64 (!) 150/138 (!) 165/64 (!) 178/69  Pulse: 80 80 80 80  Resp: 15 17 15 13   Temp: 98.4 F (36.9 C)  98.4 F (36.9 C)   TempSrc:   Oral   SpO2: 97% 94% 97% 94%  Weight:      Height:       NEURO: Minimal left upper extremity weakness which is her baseline. Her right neck incision looks fine.  LABS:   Lab Results  Component Value Date   WBC 12.1 (H) 11/08/2019   HGB 9.6 (L) 11/08/2019   HCT 30.6 (L) 11/08/2019   MCV 94.4 11/08/2019   PLT 226 11/08/2019   Lab Results  Component Value Date   CREATININE 2.28 (H) 11/08/2019   Lab Results  Component Value Date   INR 0.94 11/12/2016   CBG (last 3)  Recent Labs    11/07/19 2134 11/08/19 0651 11/08/19 1223  GLUCAP 170* 110* 121*    PROBLEM LIST:    Principal Problem:   Rt MCA Acute CVA (cerebrovascular accident) ([place]) Active Problems:   Essential hypertension, benign   DM (diabetes mellitus) ([place])   RAS (renal artery stenosis) ([place])   Subclavian arterial stenosis ([place])   COVID-19 virus infection   Carotid stenosis   Pressure injury of skin   CURRENT MEDS:   . vitamin C  500 mg Oral Daily  . aspirin EC  81 mg Oral Q0600  . docusate sodium  100 mg Oral Daily  . fenofibrate  54 mg Oral Daily  . folic acid  1 mg Oral Daily  . insulin aspart  0-6 Units Subcutaneous TID WC  . multivitamin with minerals  1 tablet Oral Daily  . pantoprazole  40 mg Oral Daily  . thiamine  100 mg Oral Daily  . zinc sulfate  220 mg Oral  Daily    [name] Office: [phone] 11/08/2019

## 2019-11-08 NOTE — Progress Notes (Signed)
Pt bp 173/80@ 2119 Prn labetalol given@2125  2130 166/58  2230 176/65 Labetalol PRN order had completed and was unable to give another dose MD notified of hypertension even after prn admin  MD okay with BP  Gave parameters of systolic greater than 335 or diastolic greater than 90  New Prn med order put in  Will continue to monitor

## 2019-11-08 NOTE — Progress Notes (Signed)
PROGRESS NOTE                                                                                                                                                                                                             Patient Demographics:    Colleen Parks, is a 85 y.o. female, DOB - 1935/01/20, OEV:[id number]  Outpatient Primary MD for the patient is Lemmie Evens, MD   Admit date - 11/03/2019   LOS - 5  Chief Complaint  Patient presents with  . Weakness       Brief Narrative: Patient is a 85 y.o. 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  Transferred from [facility] to [facility] for neurology evaluation.  See below for further details.   Subjective:   Awake and alert-very minimal left-sided neglect this morning.  No major events overnight.   Assessment  & Plan :   Acute Right MCA stroke secondary to high-grade right ICA stenosis-s/p right CEA on 2/22: continues to have very minimal left-sided weakness.  A1c 7.5, LDL 44, echo without any embolic source-telemetry negative for A. fib.  Continue aspirin-on fenofibrate-she is allergic to statin-she underwent right carotid endarterectomy on 2/22.  Neurology has signed off, [facility] following.  COVID-19 infection: Appears asymptomatic-follow inflammatory markers.  Chest x-ray without pneumonia.  On room air this morning.  Fever: afebrile  O2 requirements:  SpO2: 94 % O2 Flow Rate (L/min): 2 L/min   COVID-19 Labs: Recent Labs    11/06/19 0500 11/07/19 0311 11/08/19 0434  DDIMER 1.84* 2.66* 2.93*  FERRITIN 283 300 254  CRP 1.7* 2.0* 4.2*    No results found for: BNP  No results for input(s): PROCALCITON in the last 168 hours.  Lab Results  Component Value Date   SARSCOV2NAA  POSITIVE (A) 11/03/2019     COVID-19 Medications: None  DVT Prophylaxis  :  Heparin  Mildly elevated D-dimer: Not hypoxic-lower extremity Doppler negative-doubt requires any further work-up in the absence of hypoxemia.  AKI on CKD stage IV: Improving with supportive care-euvolemic on exam-continue to avoid nephrotoxic agents.   Acute metabolic encephalopathy: In the setting of CVA-and left-sided neglect-seems reasonably awake and alert this morning-supportive care.  History of left carotid  stenosis s/p left CEA in 2018  History of ANCA/glomerulonephritis: Not on any immunosuppressive's for the past several years-follows with nephrology in the outpatient setting.  HLD: On Repatha infusion as outpatient-on fenofibrate as well.  LDL as above.  HTN: Blood pressure creeping up-restart amlodipine today.  Initially permissive hypertension was allowed in the setting of CVA with high-grade ICA stenosis.  DM-2 (A1c 7.5): CBG stable with SSI  CBG (last 3)  Recent Labs    11/07/19 2134 11/08/19 0651 11/08/19 1223  GLUCAP 170* 110* 121*    RN pressure injury documentation: Pressure Injury 11/04/19 Sacrum Right;Left Stage 1 -  Intact skin with non-blanchable redness of a localized area usually over a bony prominence. (Active)  11/04/19 2311  Location: Sacrum  Location Orientation: Right;Left  Staging: Stage 1 -  Intact skin with non-blanchable redness of a localized area usually over a bony prominence.  Wound Description (Comments):   Present on Admission: Yes    Consults  : Neurology  Procedures  :  None  ABG: No results found for: PHART, PCO2ART, PO2ART, HCO3, TCO2, ACIDBASEDEF, O2SAT  Vent Settings: N/A  Condition -  Guarded  Family Communication  :  Daughter updated over the phone 2/23  Code Status :  Full Code  Diet :  Diet Order            Diet heart healthy/carb modified Room service appropriate? Yes; Fluid consistency: Thin  Diet effective now                 Disposition Plan  :  Remain hospitalized-probably home with home health services on 2/23 or 2/24 depending on how she does.  Barriers to discharge: CVA work-up/neurology evaluation in progress  Antimicorbials  :    Anti-infectives (From admission, onward)   Start     Dose/Rate Route Frequency Ordered Stop   11/07/19 2000  ceFAZolin (ANCEF) IVPB 2g/100 mL premix     2 g 200 mL/hr over 30 Minutes Intravenous Every 12 hours 11/07/19 1156 11/07/19 2049   11/07/19 0700  ceFAZolin (ANCEF) IVPB 2g/100 mL premix    Note to Pharmacy: Send with pt to OR   2 g 200 mL/hr over 30 Minutes Intravenous On call 11/06/19 0752 11/07/19 0845      Inpatient Medications  Scheduled Meds: . vitamin C  500 mg Oral Daily  . aspirin EC  81 mg Oral Q0600  . docusate sodium  100 mg Oral Daily  . fenofibrate  54 mg Oral Daily  . folic acid  1 mg Oral Daily  . heparin injection (subcutaneous)  5,000 Units Subcutaneous Q8H  . insulin aspart  0-6 Units Subcutaneous TID WC  . multivitamin with minerals  1 tablet Oral Daily  . pantoprazole  40 mg Oral Daily  . thiamine  100 mg Oral Daily  . zinc sulfate  220 mg Oral Daily   Continuous Infusions: . sodium chloride    . sodium chloride Stopped (11/08/19 0501)  . magnesium sulfate bolus IVPB     PRN Meds:.sodium chloride, acetaminophen **OR** acetaminophen (TYLENOL) oral liquid 160 mg/5 mL **OR** acetaminophen, alum & mag hydroxide-simeth, chlorpheniramine-HYDROcodone, guaiFENesin-dextromethorphan, labetalol, magnesium sulfate bolus IVPB, metoprolol tartrate, morphine injection, ondansetron, oxyCODONE-acetaminophen, phenol, potassium chloride, senna-docusate   Time Spent in minutes  25  See all Orders from today for further details   Oren Binet M.D on 11/08/2019 at 1:34 PM  To page go to [email or web address] - use universal password  [facility] -  Office  [phone]  Objective:   Vitals:   11/08/19 1057 11/08/19 1200 11/08/19 1221  11/08/19 1250  BP: (!) 165/64 (!) 150/138 (!) 165/64 (!) 178/69  Pulse: 80 80 80 80  Resp: 15 17 15 13   Temp: 98.4 F (36.9 C)  98.4 F (36.9 C)   TempSrc:   Oral   SpO2: 97% 94% 97% 94%  Weight:      Height:        Wt Readings from Last 3 Encounters:  11/04/19 62.5 kg  03/16/19 64 kg  11/17/18 65.2 kg     Intake/Output Summary (Last 24 hours) at 11/08/2019 1334 Last data filed at 11/08/2019 1257 Gross per 24 hour  Intake 1279.54 ml  Output 1700 ml  Net -420.46 ml     Physical Exam Gen Exam:Alert awake-not in any distress HEENT:atraumatic, normocephalic Chest: B/L clear to auscultation anteriorly CVS:S1S2 regular Abdomen:soft non tender, non distended Extremities:no edema Neurology: Minimal left hemiparesis.  Very minimal left neglect today. Skin: no rash   Data Review:    CBC Recent Labs  Lab 11/03/19 1736 11/05/19 0936 11/06/19 0500 11/07/19 0311 11/08/19 0434  WBC 8.3 8.1 7.5 8.3 12.1*  HGB 11.2* 10.4* 10.3* 10.9* 9.6*  HCT 36.4 33.3* 32.7* 34.0* 30.6*  PLT 318 285 252 282 226  MCV 96.3 94.1 92.9 93.2 94.4  MCH 29.6 29.4 29.3 29.9 29.6  MCHC 30.8 31.2 31.5 32.1 31.4  RDW 11.6 11.5 11.6 11.6 11.7  LYMPHSABS 1.9 1.9 1.9 2.1 1.9  MONOABS 0.8 0.7 0.9 1.0 1.3*  EOSABS 0.0 0.1 0.1 0.1 0.0  BASOSABS 0.0 0.0 0.0 0.0 0.0    Chemistries  Recent Labs  Lab 11/03/19 1736 11/03/19 1736 11/04/19 0544 11/05/19 0936 11/06/19 0500 11/07/19 0311 11/08/19 0434  NA 143   < > 145 147* 145 145 144  K 4.8   < > 4.6 4.5 4.4 4.8 4.4  CL 108   < > 110 113* 113* 109 111  CO2 24   < > 24 23 23 25 23   GLUCOSE 123*   < > 101* 110* 107* 117* 117*  BUN 48*   < > 48* 39* 35* 34* 26*  CREATININE 2.89*   < > 2.94* 2.56* 2.37* 2.41* 2.28*  CALCIUM 9.9   < > 9.4 9.7 9.4 9.9 9.2  AST 22  --   --  19 24 28 27   ALT 21  --   --  17 20 25 22   ALKPHOS 59  --   --  55 52 65 55  BILITOT 0.7  --   --  0.6 0.7 0.7 0.1*   < > = values in this interval not displayed.    ------------------------------------------------------------------------------------------------------------------ No results for input(s): CHOL, HDL, LDLCALC, TRIG, CHOLHDL, LDLDIRECT in the last 72 hours.  Lab Results  Component Value Date   HGBA1C 7.5 (H) 11/03/2019   ------------------------------------------------------------------------------------------------------------------ No results for input(s): TSH, T4TOTAL, T3FREE, THYROIDAB in the last 72 hours.  Invalid input(s): FREET3 ------------------------------------------------------------------------------------------------------------------ Recent Labs    11/07/19 0311 11/08/19 0434  FERRITIN 300 254    Coagulation profile No results for input(s): INR, PROTIME in the last 168 hours.  Recent Labs    11/07/19 0311 11/08/19 0434  DDIMER 2.66* 2.93*    Cardiac Enzymes No results for input(s): CKMB, TROPONINI, MYOGLOBIN in the last 168 hours.  Invalid input(s): CK ------------------------------------------------------------------------------------------------------------------ No results found for: BNP  Micro Results Recent Results (from the past 240 hour(s))  SARS CORONAVIRUS 2 (TAT 6-24 HRS) Nasopharyngeal Nasopharyngeal  Swab     Status: Abnormal   Collection Time: 11/03/19  9:22 PM   Specimen: Nasopharyngeal Swab  Result Value Ref Range Status   SARS Coronavirus 2 POSITIVE (A) NEGATIVE Final    Comment: RESULT CALLED TO, READ BACK BY AND VERIFIED WITH: Jani Gravel RN 14:15 11/04/19 ([email or web address]) (NOTE) SARS-CoV-2 target nucleic acids are DETECTED. The SARS-CoV-2 RNA is generally detectable in upper and lower respiratory specimens during the acute phase of infection. Positive results are indicative of the presence of SARS-CoV-2 RNA. Clinical correlation with patient history and other diagnostic information is  necessary to determine patient infection status. Positive results do not rule out bacterial infection  or co-infection with other viruses.  The expected result is Negative. Fact Sheet for Patients: SugarRoll.be Fact Sheet for Healthcare Providers: https://www.woods-mathews.com/ This test is not yet approved or cleared by the [place] FDA and  has been authorized for detection and/or diagnosis of SARS-CoV-2 by FDA under an Emergency Use Authorization (EUA). This EUA will remain  in effect (meaning this test can be used) for th e duration of the COVID-19 declaration under Section 564(b)(1) of the Act, 21 U.S.C. section 360bbb-3(b)(1), unless the authorization is terminated or revoked sooner. Performed at [facility], [place] [address] [place], [place] [address]     Radiology Reports CT Head Wo Contrast  Result Date: 11/03/2019 CLINICAL DATA:  Ataxia and weakness EXAM: CT HEAD WITHOUT CONTRAST TECHNIQUE: Contiguous axial images were obtained from the base of the skull through the vertex without intravenous contrast. COMPARISON:  None. FINDINGS: Brain: There is an area of hypoattenuation with loss of gray-white differentiation in the right parietal lobe (series 2, image 17). No intracranial hemorrhage or other extra-axial collection. There is periventricular hypoattenuation compatible with chronic microvascular disease. Area of cystic encephalomalacia in the right temporal lobe. Vascular: No abnormal hyperdensity of the major intracranial arteries or dural venous sinuses. No intracranial atherosclerosis. Skull: The visualized skull base, calvarium and extracranial soft tissues are normal. Sinuses/Orbits: No fluid levels or advanced mucosal thickening of the visualized paranasal sinuses. No mastoid or middle ear effusion. The orbits are normal. IMPRESSION: 1. Area of hypoattenuation with loss of gray-white differentiation in the right parietal lobe, concerning for acute to early subacute infarct. MRI may provide better temporal characterization. 2. No  intracranial hemorrhage. 3. Old right temporal lobe infarct and findings of chronic microvascular ischemia. Electronically Signed   By: Ulyses Jarred M.D.   On: 11/03/2019 18:51   MR ANGIO HEAD WO CONTRAST  Addendum Date: 11/03/2019   ADDENDUM REPORT: 11/03/2019 20:41 ADDENDUM: Study discussed by telephone with PA-C TAMMY TRIPLETT on 11/03/2019 at 20:38 hours. Electronically Signed   By: Genevie Ann M.D.   On: 11/03/2019 20:41   Result Date: 11/03/2019 CLINICAL DATA:  85 year old female with ataxia and weakness. Acute to subacute infarct in the right parietal lobe on head CT today. EXAM: MRI HEAD WITHOUT CONTRAST MRA HEAD WITHOUT CONTRAST TECHNIQUE: Multiplanar, multiecho pulse sequences of the brain and surrounding structures were obtained without intravenous contrast. Angiographic images of the head were obtained using MRA technique without contrast. COMPARISON:  Head CT 1829 hours today.  Brain MRI 01/31/2015. FINDINGS: MRI HEAD FINDINGS Brain: Study is intermittently degraded by motion artifact despite repeated imaging attempts. DWI reveals widely scattered cortical and subcortical restricted diffusion in the right hemisphere, with confluent areas of involvement both in the right posterior temporal/aparietal lobe, and also the right anterior corona radiata (series 3 image 37). All of the diffusion  abnormality seems limited to the right MCA territory. Both axial and coronal DWI is motion degraded but there is no definite left hemisphere or posterior fossa diffusion restriction. T2 and FLAIR hyperintensity in keeping with cytotoxic edema in the widely scattered areas of acute involvement. No associated hemorrhage or mass effect. Superimposed chronic right temporal lobe region CSF cyst which is stable to mildly smaller since 2016. No superimposed midline shift, ventriculomegaly, or acute intracranial hemorrhage. Cervicomedullary junction and pituitary are within normal limits. Chronic encephalomalacia in the left  occipital pole is new since 2016. And there are also several small chronic right cerebellar infarcts new from the prior MRI, largest in the SCA territory. There is chronic patchy bilateral white matter T2 and FLAIR hyperintensity which is largely stable. T2 heterogeneity in the deep gray nuclei and pons has not significantly changed. Vascular: Major intracranial vascular flow voids are stable since 2016. The right vertebral artery appears dominant. Skull and upper cervical spine: Upper cervical spine degeneration appears increased since 2016 with up to mild spinal stenosis. Visualized bone marrow signal is within normal limits. Sinuses/Orbits: No acute orbit or paranasal sinus findings. Other: Mastoids are clear. Grossly normal visible internal auditory structures. MRA HEAD FINDINGS Antegrade flow in the posterior circulation. Both distal vertebral arteries appear to remain patent although the right is dominant. Normal right PICA origin. No definite distal vertebral stenosis. Patent vertebrobasilar junction. Patent basilar artery with mild mid basilar stenosis. SCA and right PCA origins are patent. A left posterior communicating artery is present but the left PCA is occluded. Right PCA is mildly irregular but patent. Asymmetric decreased antegrade flow signal in the right ICA siphon compared to the left. Probably artifactual loss of signal in the anterior genu. The distal ICA is patent. Patent left siphon with no definite left ICA stenosis. Patent left ICA terminus. Dominant left and diminutive or absent right ACA A1 segments such that the right ACA is supplied from the left. Normal anterior communicating artery. Visible ACA branches are within normal limits. Left MCA M1 segment bifurcates early. The visible left MCA branches are patent with no high-grade stenosis identified. The diminished right ICA terminates in the MCA. The right M1 and the MCA bifurcation are patent but with decreased flow. IMPRESSION: 1.  Positive for widely scattered acute to subacute infarcts in the Right MCA territory with corresponding decreased flow in the Right ICA such as due to high-grade carotid stenosis in the neck. The right M1 and MCA bifurcation appear to remain patent. 2. Scattered infarct related cytotoxic edema in the right MCA territory with no associated hemorrhage or mass effect. 3. 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 Several chronic right cerebellar infarcts are also new since that time. 4. Chronic benign right temporal lobe CSF cyst. Chronic small vessel disease. Electronically Signed: By: Genevie Ann M.D. On: 11/03/2019 20:32   MR BRAIN WO CONTRAST  Addendum Date: 11/03/2019   ADDENDUM REPORT: 11/03/2019 20:41 ADDENDUM: Study discussed by telephone with PA-C TAMMY TRIPLETT on 11/03/2019 at 20:38 hours. Electronically Signed   By: Genevie Ann M.D.   On: 11/03/2019 20:41   Result Date: 11/03/2019 CLINICAL DATA:  85 year old female with ataxia and weakness. Acute to subacute infarct in the right parietal lobe on head CT today. EXAM: MRI HEAD WITHOUT CONTRAST MRA HEAD WITHOUT CONTRAST TECHNIQUE: Multiplanar, multiecho pulse sequences of the brain and surrounding structures were obtained without intravenous contrast. Angiographic images of  the head were obtained using MRA technique without contrast. COMPARISON:  Head CT 1829 hours today.  Brain MRI 01/31/2015. FINDINGS: MRI HEAD FINDINGS Brain: Study is intermittently degraded by motion artifact despite repeated imaging attempts. DWI reveals widely scattered cortical and subcortical restricted diffusion in the right hemisphere, with confluent areas of involvement both in the right posterior temporal/aparietal lobe, and also the right anterior corona radiata (series 3 image 37). All of the diffusion abnormality seems limited to the right MCA territory. Both axial and coronal DWI is motion  degraded but there is no definite left hemisphere or posterior fossa diffusion restriction. T2 and FLAIR hyperintensity in keeping with cytotoxic edema in the widely scattered areas of acute involvement. No associated hemorrhage or mass effect. Superimposed chronic right temporal lobe region CSF cyst which is stable to mildly smaller since 2016. No superimposed midline shift, ventriculomegaly, or acute intracranial hemorrhage. Cervicomedullary junction and pituitary are within normal limits. Chronic encephalomalacia in the left occipital pole is new since 2016. And there are also several small chronic right cerebellar infarcts new from the prior MRI, largest in the SCA territory. There is chronic patchy bilateral white matter T2 and FLAIR hyperintensity which is largely stable. T2 heterogeneity in the deep gray nuclei and pons has not significantly changed. Vascular: Major intracranial vascular flow voids are stable since 2016. The right vertebral artery appears dominant. Skull and upper cervical spine: Upper cervical spine degeneration appears increased since 2016 with up to mild spinal stenosis. Visualized bone marrow signal is within normal limits. Sinuses/Orbits: No acute orbit or paranasal sinus findings. Other: Mastoids are clear. Grossly normal visible internal auditory structures. MRA HEAD FINDINGS Antegrade flow in the posterior circulation. Both distal vertebral arteries appear to remain patent although the right is dominant. Normal right PICA origin. No definite distal vertebral stenosis. Patent vertebrobasilar junction. Patent basilar artery with mild mid basilar stenosis. SCA and right PCA origins are patent. A left posterior communicating artery is present but the left PCA is occluded. Right PCA is mildly irregular but patent. Asymmetric decreased antegrade flow signal in the right ICA siphon compared to the left. Probably artifactual loss of signal in the anterior genu. The distal ICA is patent.  Patent left siphon with no definite left ICA stenosis. Patent left ICA terminus. Dominant left and diminutive or absent right ACA A1 segments such that the right ACA is supplied from the left. Normal anterior communicating artery. Visible ACA branches are within normal limits. Left MCA M1 segment bifurcates early. The visible left MCA branches are patent with no high-grade stenosis identified. The diminished right ICA terminates in the MCA. The right M1 and the MCA bifurcation are patent but with decreased flow. IMPRESSION: 1. Positive for widely scattered acute to subacute infarcts in the Right MCA territory with corresponding decreased flow in the Right ICA such as due to high-grade carotid stenosis in the neck. The right M1 and MCA bifurcation appear to remain patent. 2. Scattered infarct related cytotoxic edema in the right MCA territory with no associated hemorrhage or mass effect. 3. 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 Several chronic right cerebellar infarcts are also new since that time. 4. Chronic benign right temporal lobe CSF cyst. Chronic small vessel disease. Electronically Signed: By: Genevie Ann M.D. On: 11/03/2019 20:32   US Venous Img Lower Bilateral (DVT)  Result Date: 11/04/2019 CLINICAL DATA:  85 year old female with recent multifocal right MCA territory  infarct. Possible PE. EXAM: BILATERAL LOWER EXTREMITY VENOUS DOPPLER ULTRASOUND TECHNIQUE: Gray-scale sonography with graded compression, as well as color Doppler and duplex ultrasound were performed to evaluate the lower extremity deep venous systems from the level of the common femoral vein and including the common femoral, femoral, profunda femoral, popliteal and calf veins including the posterior tibial, peroneal and gastrocnemius veins when visible. The superficial great saphenous vein was also interrogated. Spectral Doppler was utilized to  evaluate flow at rest and with distal augmentation maneuvers in the common femoral, femoral and popliteal veins. COMPARISON:  None. FINDINGS: RIGHT LOWER EXTREMITY Common Femoral Vein: No evidence of thrombus. Normal compressibility, respiratory phasicity and response to augmentation. Saphenofemoral Junction: No evidence of thrombus. Normal compressibility and flow on color Doppler imaging. Profunda Femoral Vein: No evidence of thrombus. Normal compressibility and flow on color Doppler imaging. Femoral Vein: No evidence of thrombus. Normal compressibility, respiratory phasicity and response to augmentation. Popliteal Vein: No evidence of thrombus. Normal compressibility, respiratory phasicity and response to augmentation. Calf Veins: No evidence of thrombus. Normal compressibility and flow on color Doppler imaging. Other Findings:  None. LEFT LOWER EXTREMITY Common Femoral Vein: No evidence of thrombus. Normal compressibility, respiratory phasicity and response to augmentation. Saphenofemoral Junction: No evidence of thrombus. Normal compressibility and flow on color Doppler imaging. Profunda Femoral Vein: No evidence of thrombus. Normal compressibility and flow on color Doppler imaging. Femoral Vein: No evidence of thrombus. Normal compressibility, respiratory phasicity and response to augmentation. Popliteal Vein: No evidence of thrombus. Normal compressibility, respiratory phasicity and response to augmentation. Calf Veins: No evidence of thrombus. Normal compressibility and flow on color Doppler imaging. Other Findings:  None. IMPRESSION: No evidence of bilateral lower extremity deep venous thrombosis. Electronically Signed   By: Genevie Ann M.D.   On: 11/04/2019 18:06   DG Chest Portable 1 View  Result Date: 11/03/2019 CLINICAL DATA:  Weakness and decreased oral intake EXAM: PORTABLE CHEST 1 VIEW COMPARISON:  07/04/2013 FINDINGS: The heart size and mediastinal contours are within normal limits. Both lungs are  clear. The visualized skeletal structures are unremarkable. IMPRESSION: No active disease. Electronically Signed   By: Ulyses Jarred M.D.   On: 11/03/2019 18:55   ECHOCARDIOGRAM COMPLETE  Result Date: 11/04/2019    ECHOCARDIOGRAM REPORT   Patient Name:   JERICKA KADAR [facility] Date of Exam: 11/04/2019 Medical Rec #:  [id number]         Height:       66.0 in Accession #:    [id number]        Weight:       140.0 lb Date of Birth:  October 04, 1934         BSA:          1.72 m Patient Age:    50 years          BP:           142/63 mmHg Patient Gender: F                 HR:           62 bpm. Exam Location:  [facility] Procedure: 2D Echo Indications:    Stroke 434.91 / I163.9  History:        Patient has no prior history of Echocardiogram examinations.                 Stroke; Risk Factors:Hypertension, Diabetes, Dyslipidemia and  Former Smoker. Cancer.  Sonographer:    Leavy Cella RDCS (AE) Referring Phys: Twining  1. Left ventricular ejection fraction, by estimation, is 60 to 65%. The left ventricle has normal function. The left ventricle has no regional wall motion abnormalities. There is mild left ventricular hypertrophy. Left ventricular diastolic parameters are consistent with Grade I diastolic dysfunction (impaired relaxation).  2. Right ventricular systolic function is normal. The right ventricular size is normal. There is normal pulmonary artery systolic pressure. The estimated right ventricular systolic pressure is 57.8 mmHg.  3. Left atrial size was mildly dilated.  4. The mitral valve is grossly normal. Trivial mitral valve regurgitation.  5. The aortic valve is tricuspid. Aortic valve regurgitation is not visualized.  6. The inferior vena cava is normal in size with greater than 50% respiratory variability, suggesting right atrial pressure of 3 mmHg. FINDINGS  Left Ventricle: Left ventricular ejection fraction, by estimation, is 60 to 65%. The left ventricle has normal  function. The left ventricle has no regional wall motion abnormalities. The left ventricular internal cavity size was normal in size. There is  mild left ventricular hypertrophy. Left ventricular diastolic parameters are consistent with Grade I diastolic dysfunction (impaired relaxation). Right Ventricle: The right ventricular size is normal. No increase in right ventricular wall thickness. Right ventricular systolic function is normal. There is normal pulmonary artery systolic pressure. The tricuspid regurgitant velocity is 2.45 m/s, and  with an assumed right atrial pressure of 3 mmHg, the estimated right ventricular systolic pressure is 46.9 mmHg. Left Atrium: Left atrial size was mildly dilated. Right Atrium: Right atrial size was normal in size. Pericardium: There is no evidence of pericardial effusion. Presence of pericardial fat pad. Mitral Valve: The mitral valve is grossly normal. Mild mitral annular calcification. Trivial mitral valve regurgitation. Tricuspid Valve: The tricuspid valve is grossly normal. Tricuspid valve regurgitation is trivial. Aortic Valve: The aortic valve is tricuspid. Aortic valve regurgitation is not visualized. Pulmonic Valve: The pulmonic valve was grossly normal. Pulmonic valve regurgitation is trivial. Aorta: The aortic root is normal in size and structure. Venous: The inferior vena cava is normal in size with greater than 50% respiratory variability, suggesting right atrial pressure of 3 mmHg. IAS/Shunts: No atrial level shunt detected by color flow Doppler.  LEFT VENTRICLE PLAX 2D LVIDd:         3.77 cm  Diastology LVIDs:         2.60 cm  LV e' lateral:   7.72 cm/s LV PW:         1.09 cm  LV E/e' lateral: 12.4 LV IVS:        1.20 cm  LV e' medial:    6.96 cm/s LVOT diam:     1.80 cm  LV E/e' medial:  13.7 LV SV Index:   21.00 LVOT Area:     2.54 cm  RIGHT VENTRICLE RV S prime:     10.10 cm/s TAPSE (M-mode): 2.0 cm LEFT ATRIUM             Index       RIGHT ATRIUM            Index LA diam:        3.20 cm 1.86 cm/m  RA Area:     10.80 cm LA Vol (A2C):   32.0 ml 18.62 ml/m RA Volume:   20.20 ml  11.75 ml/m LA Vol (A4C):   63.1 ml 36.72 ml/m LA Biplane Vol: 45.5 ml  26.48 ml/m   AORTA Ao Root diam: 2.40 cm MITRAL VALVE                TRICUSPID VALVE MV Area (PHT): 2.24 cm     TR Peak grad:   24.0 mmHg MV Decel Time: 338 msec     TR Vmax:        245.00 cm/s MV E velocity: 95.60 cm/s MV A velocity: 122.00 cm/s  SHUNTS MV E/A ratio:  0.78         Systemic Diam: 1.80 cm Rozann Lesches MD Electronically signed by Rozann Lesches MD Signature Date/Time: 11/04/2019/11:45:00 AM    Final    VAS US CAROTID  Result Date: 11/05/2019 Carotid Arterial Duplex Study Indications:                           CVA. Risk Factors:                          Hypertension, hyperlipidemia, Diabetes. Pre-Surgical Evaluation & Surgical     Stenosis at RICA only. ICA is normal past Correlation                            the stenosis. Anatomy on the right is                                        within normal limits.Right bifurcation is                                        located near the Hyoid Notch. Performing Technologist: Maudry Mayhew MHA, RDMS, RVT, RDCS  Examination Guidelines: A complete evaluation includes B-mode imaging, spectral Doppler, color Doppler, and power Doppler as needed of all accessible portions of each vessel. Bilateral testing is considered an integral part of a complete examination. Limited examinations for reoccurring indications may be performed as noted.  Right Carotid Findings: +----------+-------+-------+--------+---------------------------------+--------+           PSV    EDV    StenosisPlaque Description               Comments           cm/s   cm/s                                                     +----------+-------+-------+--------+---------------------------------+--------+ CCA Prox  71     6              smooth and homogeneous                     +----------+-------+-------+--------+---------------------------------+--------+ CCA Distal94     8              smooth and homogeneous                    +----------+-------+-------+--------+---------------------------------+--------+ ICA Prox  760    201    80-99%  heterogenous                              +----------+-------+-------+--------+---------------------------------+--------+  ICA Mid   240    35                                                       +----------+-------+-------+--------+---------------------------------+--------+ ICA Distal28     5                                                        +----------+-------+-------+--------+---------------------------------+--------+ ECA       367           >50%    irregular, heterogenous and                                               calcific                                  +----------+-------+-------+--------+---------------------------------+--------+ +----------+--------+-------+----------------+-------------------+           PSV cm/sEDV cmsDescribe        Arm Pressure (mmHG) +----------+--------+-------+----------------+-------------------+ Subclavian200            Multiphasic, WNL                    +----------+--------+-------+----------------+-------------------+ +---------+--------+---+--------+--+---------+ VertebralPSV cm/s100EDV cm/s15Antegrade +---------+--------+---+--------+--+---------+  Left Carotid Findings: +----------+--------+--------+--------+--------------------------+--------+           PSV cm/sEDV cm/sStenosisPlaque Description        Comments +----------+--------+--------+--------+--------------------------+--------+ CCA Prox  135     17                                                 +----------+--------+--------+--------+--------------------------+--------+ CCA Distal55      10              smooth and heterogenous             +----------+--------+--------+--------+--------------------------+--------+ ICA Prox  84      18      1-39%   heterogenous and irregular         +----------+--------+--------+--------+--------------------------+--------+ ICA Distal137     32                                                 +----------+--------+--------+--------+--------------------------+--------+ ECA       114                                                        +----------+--------+--------+--------+--------------------------+--------+ +----------+--------+--------+----------------+-------------------+           PSV cm/sEDV cm/sDescribe        Arm Pressure (mmHG) +----------+--------+--------+----------------+-------------------+ [id number]  Multiphasic, WNL                    +----------+--------+--------+----------------+-------------------+ +---------+--------+--+--------+---------+ VertebralPSV cm/s45EDV cm/sAntegrade +---------+--------+--+--------+---------+   Summary: Right Carotid: Velocities in the right ICA are consistent with a 80-99%                stenosis. The ECA appears >50% stenosed. Left Carotid: Velocities in the left ICA are consistent with a 1-39% stenosis. Vertebrals:  Bilateral vertebral arteries demonstrate antegrade flow. Subclavians: Normal flow hemodynamics were seen in bilateral subclavian              arteries. *See table(s) above for measurements and observations.  Electronically signed by [name] MD on 11/05/2019 at 4:51:35 PM.    Final

## 2019-11-08 NOTE — Progress Notes (Signed)
Aline removed at this time. Patient tolerated well.

## 2019-11-08 NOTE — Plan of Care (Signed)
Continue to monitor

## 2019-11-09 ENCOUNTER — Encounter: Payer: Self-pay | Admitting: *Deleted

## 2019-11-09 DIAGNOSIS — E1159 Type 2 diabetes mellitus with other circulatory complications: Secondary | ICD-10-CM

## 2019-11-09 LAB — COMPREHENSIVE METABOLIC PANEL
ALT: 24 U/L (ref 0–44)
AST: 34 U/L (ref 15–41)
Albumin: 3 g/dL — ABNORMAL LOW (ref 3.5–5.0)
Alkaline Phosphatase: 73 U/L (ref 38–126)
Anion gap: 10 (ref 5–15)
BUN: 25 mg/dL — ABNORMAL HIGH (ref 8–23)
CO2: 26 mmol/L (ref 22–32)
Calcium: 9.8 mg/dL (ref 8.9–10.3)
Chloride: 109 mmol/L (ref 98–111)
Creatinine, Ser: 2.27 mg/dL — ABNORMAL HIGH (ref 0.44–1.00)
GFR calc Af Amer: 23 mL/min — ABNORMAL LOW (ref >60)
GFR calc non Af Amer: 19 mL/min — ABNORMAL LOW (ref >60)
Glucose, Bld: 160 mg/dL — ABNORMAL HIGH (ref 70–99)
Potassium: 4.5 mmol/L (ref 3.5–5.1)
Sodium: 145 mmol/L (ref 135–145)
Total Bilirubin: 0.8 mg/dL (ref 0.3–1.2)
Total Protein: 6.8 g/dL (ref 6.5–8.1)

## 2019-11-09 LAB — CBC WITH DIFFERENTIAL/PLATELET
Abs Immature Granulocytes: 0.08 10*3/uL — ABNORMAL HIGH (ref 0.00–0.07)
Basophils Absolute: 0 10*3/uL (ref 0.0–0.1)
Basophils Relative: 0 %
Eosinophils Absolute: 0.1 10*3/uL (ref 0.0–0.5)
Eosinophils Relative: 1 %
HCT: 32.1 % — ABNORMAL LOW (ref 36.0–46.0)
Hemoglobin: 10.1 g/dL — ABNORMAL LOW (ref 12.0–15.0)
Immature Granulocytes: 1 %
Lymphocytes Relative: 15 %
Lymphs Abs: 1.5 10*3/uL (ref 0.7–4.0)
MCH: 29.4 pg (ref 26.0–34.0)
MCHC: 31.5 g/dL (ref 30.0–36.0)
MCV: 93.6 fL (ref 80.0–100.0)
Monocytes Absolute: 1 10*3/uL (ref 0.1–1.0)
Monocytes Relative: 10 %
Neutro Abs: 7.6 10*3/uL (ref 1.7–7.7)
Neutrophils Relative %: 73 %
Platelets: 248 10*3/uL (ref 150–400)
RBC: 3.43 MIL/uL — ABNORMAL LOW (ref 3.87–5.11)
RDW: 11.7 % (ref 11.5–15.5)
WBC: 10.4 10*3/uL (ref 4.0–10.5)
nRBC: 0 % (ref 0.0–0.2)

## 2019-11-09 LAB — GLUCOSE, CAPILLARY
Glucose-Capillary: 108 mg/dL — ABNORMAL HIGH (ref 70–99)
Glucose-Capillary: 125 mg/dL — ABNORMAL HIGH (ref 70–99)

## 2019-11-09 LAB — FERRITIN: Ferritin: 297 ng/mL (ref 11–307)

## 2019-11-09 LAB — C-REACTIVE PROTEIN: CRP: 12.8 mg/dL — ABNORMAL HIGH (ref <1.0)

## 2019-11-09 LAB — D-DIMER, QUANTITATIVE: D-Dimer, Quant: 2.7 ug/mL-FEU — ABNORMAL HIGH (ref 0.00–0.50)

## 2019-11-09 MED ORDER — ASPIRIN 81 MG PO TBEC: 81.0000 mg | DELAYED_RELEASE_TABLET | Freq: Every day | ORAL | Status: AC

## 2019-11-09 NOTE — Progress Notes (Signed)
OT Evaluation  Clinical Impression: PTA, pt lives with family and was Independent with ADLs, IADLs, and mobility. Presently, pt with deficits in safety, strength, endurance, and standing balance. Pt Supervision for bed mobility to sit EOB, min guard for sit to stand and initial mobility without AD, progressing to Min A after pt began experiencing sudden onset of orthostatic symptoms. OT assisted pt to chair and assessed BP, which was WNL (see below). Pt was supervision for donning socks sitting EOB, but attempted to place two socks on one foot - required cues to correct. Pt able to scan to L side with cues while seated, but experienced some inattention when on feet. Recommend HHOT and 24 hour Supervision. Will continue to follow acutely.    11/09/19 0900  OT Visit Information  Last OT Received On 11/09/19  Assistance Needed +1  History of Present Illness 85 y.o. 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 Pt presented to [facility] on 2/18 with generalized weakness, poor oral intake, confusion for 1 week. Work up at [facility] showed right MCA territory infarct-along with high-grade right ICA stenosis. She was then [facility] for further neurologic care. MRI shows scattered subacute RMCA strokes. + COVID 19 test 11/03/19.  Pt s/p carotid endarectomy on 11/07/19.  Precautions  Precautions Fall  Restrictions  Weight Bearing Restrictions No  Home Living  Family/patient expects to be discharged to: Private residence  Living Arrangements Spouse/significant other;Other relatives  Available Help at Discharge Family  Type of [place] Access Level entry  [place] One level  Bathroom Shower/Tub Walk-in shower  [place] bars - tub/shower;Walker - 2 wheels;Shower seat - built in   Lives With Spouse;Son  Prior Function  Level of Independence Independent  Comments Pt  reports Independence with ADLs, IADLs, and reports still driving  Communication  Communication No difficulties  Pain Assessment  Pain Assessment No/denies pain  Cognition  Arousal/Alertness Awake/alert  Behavior During Therapy WFL for tasks assessed/performed  Overall Cognitive Status No family/caregiver present to determine baseline cognitive functioning  General Comments alert and oriented but required frequent cues for safety and for L inattention and to scan left  Upper Extremity Assessment  Upper Extremity Assessment Generalized weakness  Lower Extremity Assessment  Lower Extremity Assessment Defer to PT evaluation  ADL  Overall ADL's  Needs assistance/impaired  Eating/Feeding Set up;Sitting  Grooming Supervision/safety;Sitting  Upper Body Bathing Supervision/ safety;Sitting  Lower Body Bathing Minimal assistance;Sit to/from stand  Upper Body Dressing  Set up;Sitting  Lower Body Dressing Min guard;Sit to/from stand  Lower Body Dressing Details (indicate cue type and reason) Pt Supervision to don socks sitting EOB, required cues due to attempting to put 2 socks on one foot  Toilet Transfer Min guard  Toileting- Clothing Manipulation and Hygiene Minimal assistance  Functional mobility during ADLs Min guard;Minimal assistance  General ADL Comments Pt min guard progressing to Min A for mobility to sink due to decreased responsiveness. OT pulled up chair to prevent fall. Pt denied dizziness or lightheadedness and BP WFL  Vision- History  Baseline Vision/History Wears glasses  Wears Glasses Reading only  Bed Mobility  Overal bed mobility Needs Assistance  Bed Mobility Sit to Supine  Supine to sit Supervision  Transfers  Overall transfer level Needs assistance  Equipment used 1 person hand held assist  Transfers Sit to/from [employer] Transfers  Sit to Stand Supervision  Stand pivot transfers Min guard;Min assist  General transfer comment for safety, decreased balance  noted with orthostatic-type behaviors   Balance  Overall balance assessment Needs assistance  Sitting-balance support Feet supported  Sitting balance-Leahy Scale Fair  Standing balance support Single extremity supported  Standing balance-Leahy Scale Fair (progressing to poor)  General Comments  General comments (skin integrity, edema, etc.) Pt denied dizziness or lightheadedness during mobility, but when walking back to bed, pt with decreased responsiveness and unsteadiness on feet. OT pulled up chair for pt to sit in. Assessed BP at 126/53, 125/81 after initial and prolonged sitting. Respirations read at 57, but unaccurate based on observations. O2 readings in 80s but suspect due to pt using hands during ADLs, on RA. RN notified of symptoms with vitals WFL  OT - End of Session  Equipment Utilized During Treatment Gait belt  Activity Tolerance Patient tolerated treatment well;Treatment limited secondary to medical complications (Comment) (limited by sudden onset of orthostatic-symptoms)  Patient left in chair;with call bell/phone within reach;with chair alarm set  Nurse Communication Mobility status;Other (comment) (Orthostatic symptoms, vital readings)  OT Assessment  OT Recommendation/Assessment Patient needs continued OT Services  OT Visit Diagnosis Unsteadiness on feet (R26.81);Muscle weakness (generalized) (M62.81)  OT Problem List Decreased strength;Decreased activity tolerance;Impaired balance (sitting and/or standing);Decreased cognition;Decreased safety awareness;Decreased knowledge of use of DME or AE  OT Plan  OT Frequency (ACUTE ONLY) Min 3X/week  OT Treatment/Interventions (ACUTE ONLY) Self-care/ADL training;Therapeutic exercise;Energy conservation;Therapeutic activities;Patient/family education  AM-PAC OT "6 Clicks" Daily Activity Outcome Measure (Version 2)  Help from another person eating meals? 3  Help from another person taking care of personal grooming? 3  Help from  another person toileting, which includes using toliet, bedpan, or urinal? 3  Help from another person bathing (including washing, rinsing, drying)? 3  Help from another person to put on and taking off regular upper body clothing? 3  Help from another person to put on and taking off regular lower body clothing? 3  6 Click Score 18  OT Recommendation  Follow Up Recommendations Home health OT;Supervision/Assistance - 24 hour  OT Equipment Other (comment) (defer to next venue)  Individuals Consulted  Consulted and Agree with Results and Recommendations Patient  Acute Rehab OT Goals  Patient Stated Goal to go home  OT Goal Formulation With patient  Time For Goal Achievement 11/23/19  Potential to Achieve Goals Good  OT Time Calculation  OT Start Time (ACUTE ONLY) 0842  OT Stop Time (ACUTE ONLY) 0925  OT Time Calculation (min) 43 min  OT General Charges  $OT Visit 1 Visit  OT Evaluation  $OT Eval Moderate Complexity 1 Mod  OT Treatments  $Self Care/Home Management  8-22 mins  $Therapeutic Activity 8-22 mins  Written Expression  Dominant Hand Left

## 2019-11-09 NOTE — Plan of Care (Signed)
  Problem: Education: Goal: Knowledge of secondary prevention will improve Outcome: Progressing   Problem: Education: Goal: Knowledge of General Education information will improve Description: Including pain rating scale, medication(s)/side effects and non-pharmacologic comfort measures Outcome: Progressing   Problem: Health Behavior/Discharge Planning: Goal: Ability to manage health-related needs will improve Outcome: Progressing   Problem: Clinical Measurements: Goal: Ability to maintain clinical measurements within normal limits will improve Outcome: Progressing Goal: Will remain free from infection Outcome: Progressing Goal: Diagnostic test results will improve Outcome: Progressing Goal: Respiratory complications will improve Outcome: Progressing Goal: Cardiovascular complication will be avoided Outcome: Progressing   Problem: Activity: Goal: Risk for activity intolerance will decrease Outcome: Progressing   Problem: Nutrition: Goal: Adequate nutrition will be maintained Outcome: Progressing   Problem: Coping: Goal: Level of anxiety will decrease Outcome: Progressing   Problem: Elimination: Goal: Will not experience complications related to bowel motility Outcome: Progressing Goal: Will not experience complications related to urinary retention Outcome: Progressing   Problem: Pain Managment: Goal: General experience of comfort will improve Outcome: Progressing   Problem: Safety: Goal: Ability to remain free from injury will improve Outcome: Progressing   Problem: Skin Integrity: Goal: Risk for impaired skin integrity will decrease Outcome: Progressing   Problem: Education: Goal: Knowledge of discharge needs will improve Outcome: Progressing   Problem: Clinical Measurements: Goal: Postoperative complications will be avoided or minimized Outcome: Progressing   Problem: Respiratory: Goal: Ability to achieve and maintain a regular respiratory rate will  improve Outcome: Progressing   Problem: Skin Integrity: Goal: Demonstration of wound healing without infection will improve Outcome: Progressing

## 2019-11-09 NOTE — Progress Notes (Signed)
Updated daughter [name] on discharge plan. IV removed. All questions answered. Patient to go home with home health. No other concerns.

## 2019-11-09 NOTE — Discharge Instructions (Signed)
Person Under Monitoring Name: Colleen Parks  Location: [address] [place] [address]   Infection Prevention Recommendations for Individuals Confirmed to have, or Being Evaluated for, 2019 Novel Coronavirus (COVID-19) Infection Who Receive Care at Home  Individuals who are confirmed to have, or are being evaluated for, COVID-19 should follow the prevention steps below until a healthcare provider or local or state health department says they can return to normal activities.  Stay home except to get medical care You should restrict activities outside your home, except for getting medical care. Do not go to work, school, or public areas, and do not use public transportation or taxis.  Call ahead before visiting your doctor Before your medical appointment, call the healthcare provider and tell them that you have, or are being evaluated for, COVID-19 infection. This will help the healthcare provider's office take steps to keep other people from getting infected. Ask your healthcare provider to call the local or state health department.  Monitor your symptoms Seek prompt medical attention if your illness is worsening (e.g., difficulty breathing). Before going to your medical appointment, call the healthcare provider and tell them that you have, or are being evaluated for, COVID-19 infection. Ask your healthcare provider to call the local or state health department.  Wear a facemask You should wear a facemask that covers your nose and mouth when you are in the same room with other people and when you visit a healthcare provider. People who live with or visit you should also wear a facemask while they are in the same room with you.  Separate yourself from other people in your home As much as possible, you should stay in a different room from other people in your home. Also, you should use a separate bathroom, if available.  Avoid sharing household items You should not  share dishes, drinking glasses, cups, eating utensils, towels, bedding, or other items with other people in your home. After using these items, you should wash them thoroughly with soap and water.  Cover your coughs and sneezes Cover your mouth and nose with a tissue when you cough or sneeze, or you can cough or sneeze into your sleeve. Throw used tissues in a lined trash can, and immediately wash your hands with soap and water for at least 20 seconds or use an alcohol-based hand rub.  Wash your [facility] your hands often and thoroughly with soap and water for at least 20 seconds. You can use an alcohol-based hand sanitizer if soap and water are not available and if your hands are not visibly dirty. Avoid touching your eyes, nose, and mouth with unwashed hands.   Prevention Steps for Caregivers and Household Members of Individuals Confirmed to have, or Being Evaluated for, COVID-19 Infection Being Cared for in the Home  If you live with, or provide care at home for, a person confirmed to have, or being evaluated for, COVID-19 infection please follow these guidelines to prevent infection:  Follow healthcare provider's instructions Make sure that you understand and can help the patient follow any healthcare provider instructions for all care.  Provide for the patient's basic needs You should help the patient with basic needs in the home and provide support for getting groceries, prescriptions, and other personal needs.  Monitor the patient's symptoms If they are getting sicker, call his or her medical provider and tell them that the patient has, or is being evaluated for, COVID-19 infection. This will help the healthcare provider's office  take steps to keep other people from getting infected. Ask the healthcare provider to call the local or state health department.  Limit the number of people who have contact with the patient  If possible, have only one caregiver for the  patient.  Other household members should stay in another home or place of residence. If this is not possible, they should stay  in another room, or be separated from the patient as much as possible. Use a separate bathroom, if available.  Restrict visitors who do not have an essential need to be in the home.  Keep older adults, very young children, and other sick people away from the patient Keep older adults, very young children, and those who have compromised immune systems or chronic health conditions away from the patient. This includes people with chronic heart, lung, or kidney conditions, diabetes, and cancer.  Ensure good ventilation Make sure that shared spaces in the home have good air flow, such as from an air conditioner or an opened window, weather permitting.  Wash your hands often  Wash your hands often and thoroughly with soap and water for at least 20 seconds. You can use an alcohol based hand sanitizer if soap and water are not available and if your hands are not visibly dirty.  Avoid touching your eyes, nose, and mouth with unwashed hands.  Use disposable paper towels to dry your hands. If not available, use dedicated cloth towels and replace them when they become wet.  Wear a facemask and gloves  Wear a disposable facemask at all times in the room and gloves when you touch or have contact with the patient's blood, body fluids, and/or secretions or excretions, such as sweat, saliva, sputum, nasal mucus, vomit, urine, or feces.  Ensure the mask fits over your nose and mouth tightly, and do not touch it during use.  Throw out disposable facemasks and gloves after using them. Do not reuse.  Wash your hands immediately after removing your facemask and gloves.  If your personal clothing becomes contaminated, carefully remove clothing and launder. Wash your hands after handling contaminated clothing.  Place all used disposable facemasks, gloves, and other waste in a lined  container before disposing them with other household waste.  Remove gloves and wash your hands immediately after handling these items.  Do not share dishes, glasses, or other household items with the patient  Avoid sharing household items. You should not share dishes, drinking glasses, cups, eating utensils, towels, bedding, or other items with a patient who is confirmed to have, or being evaluated for, COVID-19 infection.  After the person uses these items, you should wash them thoroughly with soap and water.  Wash laundry thoroughly  Immediately remove and wash clothes or bedding that have blood, body fluids, and/or secretions or excretions, such as sweat, saliva, sputum, nasal mucus, vomit, urine, or feces, on them.  Wear gloves when handling laundry from the patient.  Read and follow directions on labels of laundry or clothing items and detergent. In general, wash and dry with the warmest temperatures recommended on the label.  Clean all areas the individual has used often  Clean all touchable surfaces, such as counters, tabletops, doorknobs, bathroom fixtures, toilets, phones, keyboards, tablets, and bedside tables, every day. Also, clean any surfaces that may have blood, body fluids, and/or secretions or excretions on them.  Wear gloves when cleaning surfaces the patient has come in contact with.  Use a diluted bleach solution (e.g., dilute bleach with 1 part  bleach and 10 parts water) or a household disinfectant with a label that says EPA-registered for coronaviruses. To make a bleach solution at home, add 1 tablespoon of bleach to 1 quart (4 cups) of water. For a larger supply, add  cup of bleach to 1 gallon (16 cups) of water.  Read labels of cleaning products and follow recommendations provided on product labels. Labels contain instructions for safe and effective use of the cleaning product including precautions you should take when applying the product, such as wearing gloves or  eye protection and making sure you have good ventilation during use of the product.  Remove gloves and wash hands immediately after cleaning.  Monitor yourself for signs and symptoms of illness Caregivers and household members are considered close contacts, should monitor their health, and will be asked to limit movement outside of the home to the extent possible. Follow the monitoring steps for close contacts listed on the symptom monitoring form.   ? If you have additional questions, contact your local health department or call the epidemiologist on call at [phone] (available 24/7). ? This guidance is subject to change. For the most up-to-date guidance from [facility], please refer to their website: [email or web address]

## 2019-11-09 NOTE — Care Management Important Message (Signed)
Important Message  Patient Details  Name: WALAA CAREL MRN: [id number] Date of Birth: [birth year]   Medicare Important Message Given:  Yes - Important Message mailed due to current National Emergency  Verbal consent obtained due to current National Emergency  Relationship to patient: Child Contact Name: Cornelius Moras Call Date: 11/09/19  Time: 1308 Phone: [phone] Outcome: Spoke with contact Important Message mailed to: Patient address on file    Delorse Lek 11/09/2019, 1:09 PM

## 2019-11-09 NOTE — TOC Transition Note (Signed)
Transition of Care [facility]) - CM/SW Discharge Note   Patient Details  Name: Colleen Parks MRN: [id number] Date of Birth: Jul 17, 1935  Transition [facility]) CM/SW Contact:  Maryclare Labrador, RN Phone Number: 11/09/2019, 12:12 PM   Clinical Narrative:   CM requested to contact pts daughter by attending to discuss discharge planning.  Pt's daughter informed CM that pt will have 24 hour supervision at home post discharge.  CM provided medicare.gov HH choice to daughter - daughter chose [facility] - agency accepts.  CM informed both attending and daughter that [facility] will depend upon agency gaining insur auth from [facility] (requirement of insurance company and estimated to be less than 48 hours).  Pt has PCP and daughter denied barriers with cost of medications.  No other CM needs determined CM signing off    Final next level of care: Home w Home Health Services Barriers to Discharge: Barriers Resolved   Patient Goals and CMS Choice   CMS Medicare.gov Compare Post Acute Care list provided to:: Other (Comment Required)(daughter) Choice offered to / list presented to : Adult Children  Discharge Placement                       Discharge Plan and Services                          HH Arranged: PT, OT HH Agency: [facility] Date [facility] Agency Contacted: 11/09/19 Time [place]: 1211 Representative spoke with at [place]: [place] Determinants of Health ([place]) Interventions     Readmission Risk Interventions No flowsheet data found.

## 2019-11-09 NOTE — Discharge Summary (Signed)
PATIENT DETAILS Name: VIENNA FOLDEN Age: 85 y.o. Sex: female Date of Birth: [birth year] MRN: [id number]. Admitting Physician: Bethena Roys, MD ION:[id number], Richardson Landry, MD  Admit Date: 11/03/2019 Discharge date: 11/09/2019  Recommendations for Outpatient Follow-up:  1. Follow up with PCP in 1-2 weeks 2. Please obtain CMP/CBC in one week 3. Please ensure follow-up with neurology and vascular surgery  Admitted From:  Home  Disposition: Home with home health [place]: Yes  Equipment/Devices: None  Discharge Condition: Stable  CODE STATUS: FULL CODE  Diet recommendation:  Diet Order            Diet - low sodium heart healthy        Diet Carb Modified        Diet heart healthy/carb modified Room service appropriate? Yes; Fluid consistency: Thin  Diet effective now               Brief Summary: See H&P, Labs, Consult and Test reports for all details in brief, Patient is a 85 y.o. 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  Transferred from [facility] to [facility] for neurology evaluation.  See below for further details.   Brief Hospital Course:  Acute Right MCA stroke secondary to high-grade right ICA stenosis-s/p right CEA on 2/22: continues to have very minimal left-sided weakness.  Left hemineglect has almost resolved.  A1c 7.5, LDL 44, echo without any embolic source-telemetry negative for A. fib. Continue aspirin-on fenofibrate-she is allergic to statin-she underwent right carotid endarterectomy on 2/22-uneventful postop course.  Spoke with Dr. Vidal Schwalbe for discharge today.  Continue with aspirin on discharge.  Please ensure follow-up with neurology and vascular surgery.  COVID-19 infection: Appears asymptomatic-follow inflammatory markers.  Chest x-ray  without pneumonia.  On room air this morning.   COVID-19 Labs:  Recent Labs    11/07/19 0311 11/08/19 0434 11/09/19 0932  DDIMER 2.66* 2.93* 2.70*  FERRITIN 300 254 297  CRP 2.0* 4.2* 12.8*    Lab Results  Component Value Date   SARSCOV2NAA POSITIVE (A) 11/03/2019    Mildly elevated D-dimer: Not hypoxic-lower extremity Doppler negative-doubt requires any further work-up in the absence of hypoxemia.  AKI on CKD stage IV: Improving with supportive care-euvolemic on exam-continue to avoid nephrotoxic agents.   Acute metabolic encephalopathy: In the setting of CVA-and left-sided neglect-seems reasonably awake and alert this morning-supportive care.  History of left carotid stenosis s/p left CEA in 2018  History of ANCA/glomerulonephritis: Not on any immunosuppressive's for the past several years-follows with nephrology in the outpatient setting.  HLD: On Repatha infusion as outpatient-on fenofibrate as well.  LDL as above.  HTN: Blood pressure better controlled-continue amlodipine on discharge  DM-2 (A1c 7.5): CBG stable with SSI-while inpatient-spoke with family-she is elderly and frail-CBGs over the past 2 days have really not been significantly elevated-we have elected to have her follow with a primary care practitioner for further cares.  RN pressure injury documentation: Pressure Injury 11/04/19 Sacrum Right;Left Stage 1 -  Intact skin with non-blanchable redness of a localized area usually over a bony prominence. (Active)  11/04/19 2311  Location: Sacrum  Location Orientation: Right;Left  Staging: Stage 1 -  Intact skin with non-blanchable redness of a localized area usually over a bony prominence.  Wound Description (Comments):   Present on Admission: Yes  Procedures/Studies: None  Discharge Diagnoses:  Principal Problem:   Rt MCA Acute CVA (cerebrovascular accident) ([place]) Active Problems:   Essential hypertension, benign   DM (diabetes mellitus)  ([place])   RAS (renal artery stenosis) ([facility])   Subclavian arterial stenosis ([place])   COVID-19 virus infection   Carotid stenosis   Pressure injury of skin   Discharge Instructions:    Person Under Monitoring Name: KIDA DIGIULIO  Location: [address] [place] [address]   Infection Prevention Recommendations for Individuals Confirmed to have, or Being Evaluated for, 2019 Novel Coronavirus (COVID-19) Infection Who Receive Care at Home  Individuals who are confirmed to have, or are being evaluated for, COVID-19 should follow the prevention steps below until a healthcare provider or local or state health department says they can return to normal activities.  Stay home except to get medical care You should restrict activities outside your home, except for getting medical care. Do not go to work, school, or public areas, and do not use public transportation or taxis.  Call ahead before visiting your doctor Before your medical appointment, call the healthcare provider and tell them that you have, or are being evaluated for, COVID-19 infection. This will help the healthcare providers office take steps to keep other people from getting infected. Ask your healthcare provider to call the local or state health department.  Monitor your symptoms Seek prompt medical attention if your illness is worsening (e.g., difficulty breathing). Before going to your medical appointment, call the healthcare provider and tell them that you have, or are being evaluated for, COVID-19 infection. Ask your healthcare provider to call the local or state health department.  Wear a facemask You should wear a facemask that covers your nose and mouth when you are in the same room with other people and when you visit a healthcare provider. People who live with or visit you should also wear a facemask while they are in the same room with you.  Separate yourself from other people in your home As much as  possible, you should stay in a different room from other people in your home. Also, you should use a separate bathroom, if available.  Avoid sharing household items You should not share dishes, drinking glasses, cups, eating utensils, towels, bedding, or other items with other people in your home. After using these items, you should wash them thoroughly with soap and water.  Cover your coughs and sneezes Cover your mouth and nose with a tissue when you cough or sneeze, or you can cough or sneeze into your sleeve. Throw used tissues in a lined trash can, and immediately wash your hands with soap and water for at least 20 seconds or use an alcohol-based hand rub.  Wash your [facility] your hands often and thoroughly with soap and water for at least 20 seconds. You can use an alcohol-based hand sanitizer if soap and water are not available and if your hands are not visibly dirty. Avoid touching your eyes, nose, and mouth with unwashed hands.   Prevention Steps for Caregivers and Household Members of Individuals Confirmed to have, or Being Evaluated for, COVID-19 Infection Being Cared for in the Home  If you live with, or provide care at home for, a person confirmed to have, or being evaluated for, COVID-19 infection please follow these guidelines to prevent infection:  Follow healthcare providers instructions Make sure that you understand and can help the patient follow any healthcare provider instructions for all care.  Provide  for the patients basic needs You should help the patient with basic needs in the home and provide support for getting groceries, prescriptions, and other personal needs.  Monitor the patients symptoms If they are getting sicker, call his or her medical provider and tell them that the patient has, or is being evaluated for, COVID-19 infection. This will help the healthcare providers office take steps to keep other people from getting infected. Ask the  healthcare provider to call the local or state health department.  Limit the number of people who have contact with the patient  If possible, have only one caregiver for the patient.  Other household members should stay in another home or place of residence. If this is not possible, they should stay  in another room, or be separated from the patient as much as possible. Use a separate bathroom, if available.  Restrict visitors who do not have an essential need to be in the home.  Keep older adults, very young children, and other sick people away from the patient Keep older adults, very young children, and those who have compromised immune systems or chronic health conditions away from the patient. This includes people with chronic heart, lung, or kidney conditions, diabetes, and cancer.  Ensure good ventilation Make sure that shared spaces in the home have good air flow, such as from an air conditioner or an opened window, weather permitting.  Wash your hands often  Wash your hands often and thoroughly with soap and water for at least 20 seconds. You can use an alcohol based hand sanitizer if soap and water are not available and if your hands are not visibly dirty.  Avoid touching your eyes, nose, and mouth with unwashed hands.  Use disposable paper towels to dry your hands. If not available, use dedicated cloth towels and replace them when they become wet.  Wear a facemask and gloves  Wear a disposable facemask at all times in the room and gloves when you touch or have contact with the patients blood, body fluids, and/or secretions or excretions, such as sweat, saliva, sputum, nasal mucus, vomit, urine, or feces.  Ensure the mask fits over your nose and mouth tightly, and do not touch it during use.  Throw out disposable facemasks and gloves after using them. Do not reuse.  Wash your hands immediately after removing your facemask and gloves.  If your personal clothing becomes  contaminated, carefully remove clothing and launder. Wash your hands after handling contaminated clothing.  Place all used disposable facemasks, gloves, and other waste in a lined container before disposing them with other household waste.  Remove gloves and wash your hands immediately after handling these items.  Do not share dishes, glasses, or other household items with the patient  Avoid sharing household items. You should not share dishes, drinking glasses, cups, eating utensils, towels, bedding, or other items with a patient who is confirmed to have, or being evaluated for, COVID-19 infection.  After the person uses these items, you should wash them thoroughly with soap and water.  Wash laundry thoroughly  Immediately remove and wash clothes or bedding that have blood, body fluids, and/or secretions or excretions, such as sweat, saliva, sputum, nasal mucus, vomit, urine, or feces, on them.  Wear gloves when handling laundry from the patient.  Read and follow directions on labels of laundry or clothing items and detergent. In general, wash and dry with the warmest temperatures recommended on the label.  Clean all areas the individual has  used often  Clean all touchable surfaces, such as counters, tabletops, doorknobs, bathroom fixtures, toilets, phones, keyboards, tablets, and bedside tables, every day. Also, clean any surfaces that may have blood, body fluids, and/or secretions or excretions on them.  Wear gloves when cleaning surfaces the patient has come in contact with.  Use a diluted bleach solution (e.g., dilute bleach with 1 part bleach and 10 parts water) or a household disinfectant with a label that says EPA-registered for coronaviruses. To make a bleach solution at home, add 1 tablespoon of bleach to 1 quart (4 cups) of water. For a larger supply, add  cup of bleach to 1 gallon (16 cups) of water.  Read labels of cleaning products and follow recommendations provided on  product labels. Labels contain instructions for safe and effective use of the cleaning product including precautions you should take when applying the product, such as wearing gloves or eye protection and making sure you have good ventilation during use of the product.  Remove gloves and wash hands immediately after cleaning.  Monitor yourself for signs and symptoms of illness Caregivers and household members are considered close contacts, should monitor their health, and will be asked to limit movement outside of the home to the extent possible. Follow the monitoring steps for close contacts listed on the symptom monitoring form.   ? If you have additional questions, contact your local health department or call the epidemiologist on call at [phone] (available 24/7). ? This guidance is subject to change. For the most up-to-date guidance from [facility], please refer to their website: YouBlogs.pl    Activity:  As tolerated with Full fall precautions use walker/cane & assistance as needed   Discharge Instructions    Ambulatory referral to Neurology   Complete by: As directed    Follow up with stroke clinic NP (Jessica Vanschaick or Cecille Rubin, if both not available, consider Zachery Dauer, or [place]) at [facility] in about 4 weeks. Thanks.   Ambulatory referral to Neurology   Complete by: As directed    An appointment is requested in approximately: 4 weeks   Call MD for:  difficulty breathing, headache or visual disturbances   Complete by: As directed    Call MD for:  persistant dizziness or light-headedness   Complete by: As directed    Call MD for:  persistant nausea and vomiting   Complete by: As directed    Call MD for:  severe uncontrolled pain   Complete by: As directed    Diet - low sodium heart healthy   Complete by: As directed    Diet Carb Modified   Complete by: As directed    Discharge instructions   Complete  by: As directed    1.)  10 days of isolation from 11/03/2019 (for asymptomatic Covid)   Follow with Primary MD  Lemmie Evens, MD in 1-2 weeks  Please follow with stroke MD in 1 month, and vascular surgery in the next few weeks as instructed.  Please get a complete blood count and chemistry panel checked by your Primary MD at your next visit, and again as instructed by your Primary MD.  Get Medicines reviewed and adjusted: Please take all your medications with you for your next visit with your Primary MD  Laboratory/radiological data: Please request your Primary MD to go over all hospital tests and procedure/radiological results at the follow up, please ask your Primary MD to get all Hospital records sent to his/her office.  In some cases, they will  be blood work, cultures and biopsy results pending at the time of your discharge. Please request that your primary care M.D. follows up on these results.  Also Note the following: If you experience worsening of your admission symptoms, develop shortness of breath, life threatening emergency, suicidal or homicidal thoughts you must seek medical attention immediately by calling 911 or calling your MD immediately  if symptoms less severe.  You must read complete instructions/literature along with all the possible adverse reactions/side effects for all the Medicines you take and that have been prescribed to you. Take any new Medicines after you have completely understood and accpet all the possible adverse reactions/side effects.   Do not drive when taking Pain medications or sleeping medications (Benzodaizepines)  Do not take more than prescribed Pain, Sleep and Anxiety Medications. It is not advisable to combine anxiety,sleep and pain medications without talking with your primary care practitioner  Special Instructions: If you have smoked or chewed Tobacco  in the last 2 yrs please stop smoking, stop any regular Alcohol  and or any Recreational  drug use.  Wear Seat belts while driving.  Please note: You were cared for by a hospitalist during your hospital stay. Once you are discharged, your primary care physician will handle any further medical issues. Please note that NO REFILLS for any discharge medications will be authorized once you are discharged, as it is imperative that you return to your primary care physician (or establish a relationship with a primary care physician if you do not have one) for your post hospital discharge needs so that they can reassess your need for medications and monitor your lab values.   Increase activity slowly   Complete by: As directed      Allergies as of 11/09/2019      Reactions   Codeine Nausea And Vomiting   Ezetimibe Other (See Comments)   Myalgias   Metformin And Related Other (See Comments)   Had kidney problems and nephrologist told her never to take it again   Statins Other (See Comments)   Myalgias with rosuvastatin 5mg  daily, lovastatin 20mg  daily, pravastatin 80mg  daily      Medication List    TAKE these medications   acetaminophen 325 MG tablet Commonly known as: TYLENOL Take 325 mg by mouth every 6 (six) hours as needed for mild pain.   amLODipine 5 MG tablet Commonly known as: NORVASC TAKE 1 TABLET BY MOUTH DAILY.   aspirin 81 MG EC tablet Take 1 tablet (81 mg total) by mouth daily.   cyanocobalamin 1000 MCG tablet Take 1,000 mcg by mouth daily.   fenofibrate 48 MG tablet Commonly known as: TRICOR TAKE 1 TABLET BY MOUTH ONCE A DAY.   FLUoxetine 10 MG capsule Commonly known as: PROZAC Take 10 mg by mouth daily.   multivitamin with minerals Tabs tablet Take 1 tablet by mouth daily. For over 50   oxybutynin 5 MG 24 hr tablet Commonly known as: DITROPAN-XL Take 5 mg by mouth daily.   pantoprazole 40 MG tablet Commonly known as: PROTONIX Take 1 tablet by mouth as needed.   Repatha SureClick 846 MG/ML [place] Generic drug: Evolocumab Inject 1 pen into the  skin every 14 (fourteen) days.      Follow-up Information    [facility]. Schedule an appointment as soon as possible for a visit in 4 week(s).   Specialty: Neurology Contact information: [address] [place] [phone]  Lemmie Evens, MD. Schedule an appointment as soon as possible for a visit in 1 week(s).   Specialty: Family Medicine Contact information: [place] [address] [phone]        Angelia Mould, MD. Schedule an appointment as soon as possible for a visit in 2 week(s).   Specialties: Vascular Surgery, Cardiology Contact information: [place] [address] [phone]          Allergies  Allergen Reactions   Codeine Nausea And Vomiting   Ezetimibe Other (See Comments)    Myalgias   Metformin And Related Other (See Comments)    Had kidney problems and nephrologist told her never to take it again   Statins Other (See Comments)    Myalgias with rosuvastatin 5mg  daily, lovastatin 20mg  daily, pravastatin 80mg  daily    Consultations:   neurology and vascular surgery   Other Procedures/Studies: CT Head Wo Contrast  Result Date: 11/03/2019 CLINICAL DATA:  Ataxia and weakness EXAM: CT HEAD WITHOUT CONTRAST TECHNIQUE: Contiguous axial images were obtained from the base of the skull through the vertex without intravenous contrast. COMPARISON:  None. FINDINGS: Brain: There is an area of hypoattenuation with loss of gray-white differentiation in the right parietal lobe (series 2, image 17). No intracranial hemorrhage or other extra-axial collection. There is periventricular hypoattenuation compatible with chronic microvascular disease. Area of cystic encephalomalacia in the right temporal lobe. Vascular: No abnormal hyperdensity of the major intracranial arteries or dural venous sinuses. No intracranial atherosclerosis. Skull: The visualized skull  base, calvarium and extracranial soft tissues are normal. Sinuses/Orbits: No fluid levels or advanced mucosal thickening of the visualized paranasal sinuses. No mastoid or middle ear effusion. The orbits are normal. IMPRESSION: 1. Area of hypoattenuation with loss of gray-white differentiation in the right parietal lobe, concerning for acute to early subacute infarct. MRI may provide better temporal characterization. 2. No intracranial hemorrhage. 3. Old right temporal lobe infarct and findings of chronic microvascular ischemia. Electronically Signed   By: Ulyses Jarred M.D.   On: 11/03/2019 18:51   MR ANGIO HEAD WO CONTRAST  Addendum Date: 11/03/2019   ADDENDUM REPORT: 11/03/2019 20:41 ADDENDUM: Study discussed by telephone with PA-C TAMMY TRIPLETT on 11/03/2019 at 20:38 hours. Electronically Signed   By: Genevie Ann M.D.   On: 11/03/2019 20:41   Result Date: 11/03/2019 CLINICAL DATA:  85 year old female with ataxia and weakness. Acute to subacute infarct in the right parietal lobe on head CT today. EXAM: MRI HEAD WITHOUT CONTRAST MRA HEAD WITHOUT CONTRAST TECHNIQUE: Multiplanar, multiecho pulse sequences of the brain and surrounding structures were obtained without intravenous contrast. Angiographic images of the head were obtained using MRA technique without contrast. COMPARISON:  Head CT 1829 hours today.  Brain MRI 01/31/2015. FINDINGS: MRI HEAD FINDINGS Brain: Study is intermittently degraded by motion artifact despite repeated imaging attempts. DWI reveals widely scattered cortical and subcortical restricted diffusion in the right hemisphere, with confluent areas of involvement both in the right posterior temporal/aparietal lobe, and also the right anterior corona radiata (series 3 image 37). All of the diffusion abnormality seems limited to the right MCA territory. Both axial and coronal DWI is motion degraded but there is no definite left hemisphere or posterior fossa diffusion restriction. T2 and FLAIR  hyperintensity in keeping with cytotoxic edema in the widely scattered areas of acute involvement. No associated hemorrhage or mass effect. Superimposed chronic right temporal lobe region CSF cyst which is stable to mildly smaller since 2016. No superimposed midline  shift, ventriculomegaly, or acute intracranial hemorrhage. Cervicomedullary junction and pituitary are within normal limits. Chronic encephalomalacia in the left occipital pole is new since 2016. And there are also several small chronic right cerebellar infarcts new from the prior MRI, largest in the SCA territory. There is chronic patchy bilateral white matter T2 and FLAIR hyperintensity which is largely stable. T2 heterogeneity in the deep gray nuclei and pons has not significantly changed. Vascular: Major intracranial vascular flow voids are stable since 2016. The right vertebral artery appears dominant. Skull and upper cervical spine: Upper cervical spine degeneration appears increased since 2016 with up to mild spinal stenosis. Visualized bone marrow signal is within normal limits. Sinuses/Orbits: No acute orbit or paranasal sinus findings. Other: Mastoids are clear. Grossly normal visible internal auditory structures. MRA HEAD FINDINGS Antegrade flow in the posterior circulation. Both distal vertebral arteries appear to remain patent although the right is dominant. Normal right PICA origin. No definite distal vertebral stenosis. Patent vertebrobasilar junction. Patent basilar artery with mild mid basilar stenosis. SCA and right PCA origins are patent. A left posterior communicating artery is present but the left PCA is occluded. Right PCA is mildly irregular but patent. Asymmetric decreased antegrade flow signal in the right ICA siphon compared to the left. Probably artifactual loss of signal in the anterior genu. The distal ICA is patent. Patent left siphon with no definite left ICA stenosis. Patent left ICA terminus. Dominant left and diminutive  or absent right ACA A1 segments such that the right ACA is supplied from the left. Normal anterior communicating artery. Visible ACA branches are within normal limits. Left MCA M1 segment bifurcates early. The visible left MCA branches are patent with no high-grade stenosis identified. The diminished right ICA terminates in the MCA. The right M1 and the MCA bifurcation are patent but with decreased flow. IMPRESSION: 1. Positive for widely scattered acute to subacute infarcts in the Right MCA territory with corresponding decreased flow in the Right ICA such as due to high-grade carotid stenosis in the neck. The right M1 and MCA bifurcation appear to remain patent. 2. Scattered infarct related cytotoxic edema in the right MCA territory with no associated hemorrhage or mass effect. 3. 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 Several chronic right cerebellar infarcts are also new since that time. 4. Chronic benign right temporal lobe CSF cyst. Chronic small vessel disease. Electronically Signed: By: Genevie Ann M.D. On: 11/03/2019 20:32   MR BRAIN WO CONTRAST  Addendum Date: 11/03/2019   ADDENDUM REPORT: 11/03/2019 20:41 ADDENDUM: Study discussed by telephone with PA-C TAMMY TRIPLETT on 11/03/2019 at 20:38 hours. Electronically Signed   By: Genevie Ann M.D.   On: 11/03/2019 20:41   Result Date: 11/03/2019 CLINICAL DATA:  84 year old female with ataxia and weakness. Acute to subacute infarct in the right parietal lobe on head CT today. EXAM: MRI HEAD WITHOUT CONTRAST MRA HEAD WITHOUT CONTRAST TECHNIQUE: Multiplanar, multiecho pulse sequences of the brain and surrounding structures were obtained without intravenous contrast. Angiographic images of the head were obtained using MRA technique without contrast. COMPARISON:  Head CT 1829 hours today.  Brain MRI 01/31/2015. FINDINGS: MRI HEAD FINDINGS Brain: Study is intermittently degraded  by motion artifact despite repeated imaging attempts. DWI reveals widely scattered cortical and subcortical restricted diffusion in the right hemisphere, with confluent areas of involvement both in the right posterior temporal/aparietal lobe, and also the right anterior corona radiata (series 3  image 37). All of the diffusion abnormality seems limited to the right MCA territory. Both axial and coronal DWI is motion degraded but there is no definite left hemisphere or posterior fossa diffusion restriction. T2 and FLAIR hyperintensity in keeping with cytotoxic edema in the widely scattered areas of acute involvement. No associated hemorrhage or mass effect. Superimposed chronic right temporal lobe region CSF cyst which is stable to mildly smaller since 2016. No superimposed midline shift, ventriculomegaly, or acute intracranial hemorrhage. Cervicomedullary junction and pituitary are within normal limits. Chronic encephalomalacia in the left occipital pole is new since 2016. And there are also several small chronic right cerebellar infarcts new from the prior MRI, largest in the SCA territory. There is chronic patchy bilateral white matter T2 and FLAIR hyperintensity which is largely stable. T2 heterogeneity in the deep gray nuclei and pons has not significantly changed. Vascular: Major intracranial vascular flow voids are stable since 2016. The right vertebral artery appears dominant. Skull and upper cervical spine: Upper cervical spine degeneration appears increased since 2016 with up to mild spinal stenosis. Visualized bone marrow signal is within normal limits. Sinuses/Orbits: No acute orbit or paranasal sinus findings. Other: Mastoids are clear. Grossly normal visible internal auditory structures. MRA HEAD FINDINGS Antegrade flow in the posterior circulation. Both distal vertebral arteries appear to remain patent although the right is dominant. Normal right PICA origin. No definite distal vertebral stenosis.  Patent vertebrobasilar junction. Patent basilar artery with mild mid basilar stenosis. SCA and right PCA origins are patent. A left posterior communicating artery is present but the left PCA is occluded. Right PCA is mildly irregular but patent. Asymmetric decreased antegrade flow signal in the right ICA siphon compared to the left. Probably artifactual loss of signal in the anterior genu. The distal ICA is patent. Patent left siphon with no definite left ICA stenosis. Patent left ICA terminus. Dominant left and diminutive or absent right ACA A1 segments such that the right ACA is supplied from the left. Normal anterior communicating artery. Visible ACA branches are within normal limits. Left MCA M1 segment bifurcates early. The visible left MCA branches are patent with no high-grade stenosis identified. The diminished right ICA terminates in the MCA. The right M1 and the MCA bifurcation are patent but with decreased flow. IMPRESSION: 1. Positive for widely scattered acute to subacute infarcts in the Right MCA territory with corresponding decreased flow in the Right ICA such as due to high-grade carotid stenosis in the neck. The right M1 and MCA bifurcation appear to remain patent. 2. Scattered infarct related cytotoxic edema in the right MCA territory with no associated hemorrhage or mass effect. 3. 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 Several chronic right cerebellar infarcts are also new since that time. 4. Chronic benign right temporal lobe CSF cyst. Chronic small vessel disease. Electronically Signed: By: Genevie Ann M.D. On: 11/03/2019 20:32   US Venous Img Lower Bilateral (DVT)  Result Date: 11/04/2019 CLINICAL DATA:  85 year old female with recent multifocal right MCA territory infarct. Possible PE. EXAM: BILATERAL LOWER EXTREMITY VENOUS DOPPLER ULTRASOUND TECHNIQUE: Gray-scale sonography with graded compression,  as well as color Doppler and duplex ultrasound were performed to evaluate the lower extremity deep venous systems from the level of the common femoral vein and including the common femoral, femoral, profunda femoral, popliteal and calf veins including the posterior tibial, peroneal and gastrocnemius veins when visible. The superficial great saphenous vein was  also interrogated. Spectral Doppler was utilized to evaluate flow at rest and with distal augmentation maneuvers in the common femoral, femoral and popliteal veins. COMPARISON:  None. FINDINGS: RIGHT LOWER EXTREMITY Common Femoral Vein: No evidence of thrombus. Normal compressibility, respiratory phasicity and response to augmentation. Saphenofemoral Junction: No evidence of thrombus. Normal compressibility and flow on color Doppler imaging. Profunda Femoral Vein: No evidence of thrombus. Normal compressibility and flow on color Doppler imaging. Femoral Vein: No evidence of thrombus. Normal compressibility, respiratory phasicity and response to augmentation. Popliteal Vein: No evidence of thrombus. Normal compressibility, respiratory phasicity and response to augmentation. Calf Veins: No evidence of thrombus. Normal compressibility and flow on color Doppler imaging. Other Findings:  None. LEFT LOWER EXTREMITY Common Femoral Vein: No evidence of thrombus. Normal compressibility, respiratory phasicity and response to augmentation. Saphenofemoral Junction: No evidence of thrombus. Normal compressibility and flow on color Doppler imaging. Profunda Femoral Vein: No evidence of thrombus. Normal compressibility and flow on color Doppler imaging. Femoral Vein: No evidence of thrombus. Normal compressibility, respiratory phasicity and response to augmentation. Popliteal Vein: No evidence of thrombus. Normal compressibility, respiratory phasicity and response to augmentation. Calf Veins: No evidence of thrombus. Normal compressibility and flow on color Doppler imaging.  Other Findings:  None. IMPRESSION: No evidence of bilateral lower extremity deep venous thrombosis. Electronically Signed   By: Genevie Ann M.D.   On: 11/04/2019 18:06   DG Chest Portable 1 View  Result Date: 11/03/2019 CLINICAL DATA:  Weakness and decreased oral intake EXAM: PORTABLE CHEST 1 VIEW COMPARISON:  07/04/2013 FINDINGS: The heart size and mediastinal contours are within normal limits. Both lungs are clear. The visualized skeletal structures are unremarkable. IMPRESSION: No active disease. Electronically Signed   By: Ulyses Jarred M.D.   On: 11/03/2019 18:55   ECHOCARDIOGRAM COMPLETE  Result Date: 11/04/2019    ECHOCARDIOGRAM REPORT   Patient Name:   JOANA NOLTON [facility] Date of Exam: 11/04/2019 Medical Rec #:  [id number]         Height:       66.0 in Accession #:    [id number]        Weight:       140.0 lb Date of Birth:  March 19, 1935         BSA:          1.72 m Patient Age:    24 years          BP:           142/63 mmHg Patient Gender: F                 HR:           62 bpm. Exam Location:  [facility] Procedure: 2D Echo Indications:    Stroke 434.91 / I163.9  History:        Patient has no prior history of Echocardiogram examinations.                 Stroke; Risk Factors:Hypertension, Diabetes, Dyslipidemia and                 Former Smoker. Cancer.  Sonographer:    Leavy Cella RDCS (AE) Referring Phys: [place]  1. Left ventricular ejection fraction, by estimation, is 60 to 65%. The left ventricle has normal function. The left ventricle has no regional wall motion abnormalities. There is mild left ventricular hypertrophy. Left ventricular diastolic parameters are consistent with Grade I diastolic dysfunction (impaired relaxation).  2. Right  ventricular systolic function is normal. The right ventricular size is normal. There is normal pulmonary artery systolic pressure. The estimated right ventricular systolic pressure is 10.9 mmHg.  3. Left atrial size was mildly dilated.   4. The mitral valve is grossly normal. Trivial mitral valve regurgitation.  5. The aortic valve is tricuspid. Aortic valve regurgitation is not visualized.  6. The inferior vena cava is normal in size with greater than 50% respiratory variability, suggesting right atrial pressure of 3 mmHg. FINDINGS  Left Ventricle: Left ventricular ejection fraction, by estimation, is 60 to 65%. The left ventricle has normal function. The left ventricle has no regional wall motion abnormalities. The left ventricular internal cavity size was normal in size. There is  mild left ventricular hypertrophy. Left ventricular diastolic parameters are consistent with Grade I diastolic dysfunction (impaired relaxation). Right Ventricle: The right ventricular size is normal. No increase in right ventricular wall thickness. Right ventricular systolic function is normal. There is normal pulmonary artery systolic pressure. The tricuspid regurgitant velocity is 2.45 m/s, and  with an assumed right atrial pressure of 3 mmHg, the estimated right ventricular systolic pressure is 32.3 mmHg. Left Atrium: Left atrial size was mildly dilated. Right Atrium: Right atrial size was normal in size. Pericardium: There is no evidence of pericardial effusion. Presence of pericardial fat pad. Mitral Valve: The mitral valve is grossly normal. Mild mitral annular calcification. Trivial mitral valve regurgitation. Tricuspid Valve: The tricuspid valve is grossly normal. Tricuspid valve regurgitation is trivial. Aortic Valve: The aortic valve is tricuspid. Aortic valve regurgitation is not visualized. Pulmonic Valve: The pulmonic valve was grossly normal. Pulmonic valve regurgitation is trivial. Aorta: The aortic root is normal in size and structure. Venous: The inferior vena cava is normal in size with greater than 50% respiratory variability, suggesting right atrial pressure of 3 mmHg. IAS/Shunts: No atrial level shunt detected by color flow Doppler.  LEFT VENTRICLE  PLAX 2D LVIDd:         3.77 cm  Diastology LVIDs:         2.60 cm  LV e' lateral:   7.72 cm/s LV PW:         1.09 cm  LV E/e' lateral: 12.4 LV IVS:        1.20 cm  LV e' medial:    6.96 cm/s LVOT diam:     1.80 cm  LV E/e' medial:  13.7 LV SV Index:   21.00 LVOT Area:     2.54 cm  RIGHT VENTRICLE RV S prime:     10.10 cm/s TAPSE (M-mode): 2.0 cm LEFT ATRIUM             Index       RIGHT ATRIUM           Index LA diam:        3.20 cm 1.86 cm/m  RA Area:     10.80 cm LA Vol (A2C):   32.0 ml 18.62 ml/m RA Volume:   20.20 ml  11.75 ml/m LA Vol (A4C):   63.1 ml 36.72 ml/m LA Biplane Vol: 45.5 ml 26.48 ml/m   AORTA Ao Root diam: 2.40 cm MITRAL VALVE                TRICUSPID VALVE MV Area (PHT): 2.24 cm     TR Peak grad:   24.0 mmHg MV Decel Time: 338 msec     TR Vmax:        245.00 cm/s MV E velocity:  95.60 cm/s MV A velocity: 122.00 cm/s  SHUNTS MV E/A ratio:  0.78         Systemic Diam: 1.80 cm Rozann Lesches MD Electronically signed by Rozann Lesches MD Signature Date/Time: 11/04/2019/11:45:00 AM    Final    VAS US CAROTID  Result Date: 11/05/2019 Carotid Arterial Duplex Study Indications:                           CVA. Risk Factors:                          Hypertension, hyperlipidemia, Diabetes. Pre-Surgical Evaluation & Surgical     Stenosis at RICA only. ICA is normal past Correlation                            the stenosis. Anatomy on the right is                                        within normal limits.Right bifurcation is                                        located near the Hyoid Notch. Performing Technologist: Maudry Mayhew MHA, RDMS, RVT, RDCS  Examination Guidelines: A complete evaluation includes B-mode imaging, spectral Doppler, color Doppler, and power Doppler as needed of all accessible portions of each vessel. Bilateral testing is considered an integral part of a complete examination. Limited examinations for reoccurring indications may be performed as noted.  Right Carotid  Findings: +----------+-------+-------+--------+---------------------------------+--------+             PSV     EDV     Stenosis Plaque Description                Comments              cm/s    cm/s                                                         +----------+-------+-------+--------+---------------------------------+--------+  CCA Prox   71      6                smooth and homogeneous                      +----------+-------+-------+--------+---------------------------------+--------+  CCA Distal 94      8                smooth and homogeneous                      +----------+-------+-------+--------+---------------------------------+--------+  ICA Prox   760     201     80-99%   heterogenous                                +----------+-------+-------+--------+---------------------------------+--------+  ICA Mid    240     35                                                           +----------+-------+-------+--------+---------------------------------+--------+  ICA Distal 28      5                                                            +----------+-------+-------+--------+---------------------------------+--------+  ECA        367             >50%     irregular, heterogenous and                                                      calcific                                    +----------+-------+-------+--------+---------------------------------+--------+ +----------+--------+-------+----------------+-------------------+             PSV cm/s EDV cms Describe         Arm Pressure (mmHG)  +----------+--------+-------+----------------+-------------------+  Subclavian 200              Multiphasic, WNL                      +----------+--------+-------+----------------+-------------------+ +---------+--------+---+--------+--+---------+  Vertebral PSV cm/s 100 EDV cm/s 15 Antegrade  +---------+--------+---+--------+--+---------+  Left Carotid Findings:  +----------+--------+--------+--------+--------------------------+--------+             PSV cm/s EDV cm/s Stenosis Plaque Description         Comments  +----------+--------+--------+--------+--------------------------+--------+  CCA Prox   135      17                                                     +----------+--------+--------+--------+--------------------------+--------+  CCA Distal 55       10                smooth and heterogenous              +----------+--------+--------+--------+--------------------------+--------+  ICA Prox   84       18       1-39%    heterogenous and irregular           +----------+--------+--------+--------+--------------------------+--------+  ICA Distal 137      32                                                     +----------+--------+--------+--------+--------------------------+--------+  ECA        114                                                             +----------+--------+--------+--------+--------------------------+--------+ +----------+--------+--------+----------------+-------------------+  PSV cm/s EDV cm/s Describe         Arm Pressure (mmHG)  +----------+--------+--------+----------------+-------------------+  Subclavian 199               Multiphasic, WNL                      +----------+--------+--------+----------------+-------------------+ +---------+--------+--+--------+---------+  Vertebral PSV cm/s 45 EDV cm/s Antegrade  +---------+--------+--+--------+---------+   Summary: Right Carotid: Velocities in the right ICA are consistent with a 80-99%                stenosis. The ECA appears >50% stenosed. Left Carotid: Velocities in the left ICA are consistent with a 1-39% stenosis. Vertebrals:  Bilateral vertebral arteries demonstrate antegrade flow. Subclavians: Normal flow hemodynamics were seen in bilateral subclavian              arteries. *See table(s) above for measurements and observations.  Electronically signed by [name] MD on  11/05/2019 at 4:51:35 PM.    Final      TODAY-DAY OF DISCHARGE:  Subjective:   William Dalton today has no headache,no chest abdominal pain,no new weakness tingling or numbness, feels much better wants to go home today.  Objective:   Blood pressure (!) 136/56, pulse 82, temperature 98 F (36.7 C), temperature source Axillary, resp. rate 20, height 5\' 6"  (1.676 m), weight 62.5 kg, SpO2 94 %.  Intake/Output Summary (Last 24 hours) at 11/09/2019 1210 Last data filed at 11/09/2019 1140 Gross per 24 hour  Intake 340 ml  Output 1450 ml  Net -1110 ml   Filed Weights   11/03/19 1729 11/04/19 2213  Weight: 63.5 kg 62.5 kg    Exam: Awake Alert, Oriented *3, No new F.N deficits, Normal affect [place].AT,PERRAL Supple Neck,No JVD, No cervical lymphadenopathy appriciated.  Symmetrical Chest wall movement, Good air movement bilaterally, CTAB RRR,No Gallops,Rubs or new Murmurs, No Parasternal Heave +ve B.Sounds, Abd Soft, Non tender, No organomegaly appriciated, No rebound -guarding or rigidity. No Cyanosis, Clubbing or edema, No new Rash or bruise   PERTINENT RADIOLOGIC STUDIES: CT Head Wo Contrast  Result Date: 11/03/2019 CLINICAL DATA:  Ataxia and weakness EXAM: CT HEAD WITHOUT CONTRAST TECHNIQUE: Contiguous axial images were obtained from the base of the skull through the vertex without intravenous contrast. COMPARISON:  None. FINDINGS: Brain: There is an area of hypoattenuation with loss of gray-white differentiation in the right parietal lobe (series 2, image 17). No intracranial hemorrhage or other extra-axial collection. There is periventricular hypoattenuation compatible with chronic microvascular disease. Area of cystic encephalomalacia in the right temporal lobe. Vascular: No abnormal hyperdensity of the major intracranial arteries or dural venous sinuses. No intracranial atherosclerosis. Skull: The visualized skull base, calvarium and extracranial soft tissues are normal.  Sinuses/Orbits: No fluid levels or advanced mucosal thickening of the visualized paranasal sinuses. No mastoid or middle ear effusion. The orbits are normal. IMPRESSION: 1. Area of hypoattenuation with loss of gray-white differentiation in the right parietal lobe, concerning for acute to early subacute infarct. MRI may provide better temporal characterization. 2. No intracranial hemorrhage. 3. Old right temporal lobe infarct and findings of chronic microvascular ischemia. Electronically Signed   By: Ulyses Jarred M.D.   On: 11/03/2019 18:51   MR ANGIO HEAD WO CONTRAST  Addendum Date: 11/03/2019   ADDENDUM REPORT: 11/03/2019 20:41 ADDENDUM: Study discussed by telephone with PA-C TAMMY TRIPLETT on 11/03/2019 at 20:38 hours. Electronically Signed   By: Genevie Ann M.D.   On: 11/03/2019 20:41  Result Date: 11/03/2019 CLINICAL DATA:  85 year old female with ataxia and weakness. Acute to subacute infarct in the right parietal lobe on head CT today. EXAM: MRI HEAD WITHOUT CONTRAST MRA HEAD WITHOUT CONTRAST TECHNIQUE: Multiplanar, multiecho pulse sequences of the brain and surrounding structures were obtained without intravenous contrast. Angiographic images of the head were obtained using MRA technique without contrast. COMPARISON:  Head CT 1829 hours today.  Brain MRI 01/31/2015. FINDINGS: MRI HEAD FINDINGS Brain: Study is intermittently degraded by motion artifact despite repeated imaging attempts. DWI reveals widely scattered cortical and subcortical restricted diffusion in the right hemisphere, with confluent areas of involvement both in the right posterior temporal/aparietal lobe, and also the right anterior corona radiata (series 3 image 37). All of the diffusion abnormality seems limited to the right MCA territory. Both axial and coronal DWI is motion degraded but there is no definite left hemisphere or posterior fossa diffusion restriction. T2 and FLAIR hyperintensity in keeping with cytotoxic edema in the widely  scattered areas of acute involvement. No associated hemorrhage or mass effect. Superimposed chronic right temporal lobe region CSF cyst which is stable to mildly smaller since 2016. No superimposed midline shift, ventriculomegaly, or acute intracranial hemorrhage. Cervicomedullary junction and pituitary are within normal limits. Chronic encephalomalacia in the left occipital pole is new since 2016. And there are also several small chronic right cerebellar infarcts new from the prior MRI, largest in the SCA territory. There is chronic patchy bilateral white matter T2 and FLAIR hyperintensity which is largely stable. T2 heterogeneity in the deep gray nuclei and pons has not significantly changed. Vascular: Major intracranial vascular flow voids are stable since 2016. The right vertebral artery appears dominant. Skull and upper cervical spine: Upper cervical spine degeneration appears increased since 2016 with up to mild spinal stenosis. Visualized bone marrow signal is within normal limits. Sinuses/Orbits: No acute orbit or paranasal sinus findings. Other: Mastoids are clear. Grossly normal visible internal auditory structures. MRA HEAD FINDINGS Antegrade flow in the posterior circulation. Both distal vertebral arteries appear to remain patent although the right is dominant. Normal right PICA origin. No definite distal vertebral stenosis. Patent vertebrobasilar junction. Patent basilar artery with mild mid basilar stenosis. SCA and right PCA origins are patent. A left posterior communicating artery is present but the left PCA is occluded. Right PCA is mildly irregular but patent. Asymmetric decreased antegrade flow signal in the right ICA siphon compared to the left. Probably artifactual loss of signal in the anterior genu. The distal ICA is patent. Patent left siphon with no definite left ICA stenosis. Patent left ICA terminus. Dominant left and diminutive or absent right ACA A1 segments such that the right ACA is  supplied from the left. Normal anterior communicating artery. Visible ACA branches are within normal limits. Left MCA M1 segment bifurcates early. The visible left MCA branches are patent with no high-grade stenosis identified. The diminished right ICA terminates in the MCA. The right M1 and the MCA bifurcation are patent but with decreased flow. IMPRESSION: 1. Positive for widely scattered acute to subacute infarcts in the Right MCA territory with corresponding decreased flow in the Right ICA such as due to high-grade carotid stenosis in the neck. The right M1 and MCA bifurcation appear to remain patent. 2. Scattered infarct related cytotoxic edema in the right MCA territory with no associated hemorrhage or mass effect. 3. 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 Several chronic right  cerebellar infarcts are also new since that time. 4. Chronic benign right temporal lobe CSF cyst. Chronic small vessel disease. Electronically Signed: By: Genevie Ann M.D. On: 11/03/2019 20:32   MR BRAIN WO CONTRAST  Addendum Date: 11/03/2019   ADDENDUM REPORT: 11/03/2019 20:41 ADDENDUM: Study discussed by telephone with PA-C TAMMY TRIPLETT on 11/03/2019 at 20:38 hours. Electronically Signed   By: Genevie Ann M.D.   On: 11/03/2019 20:41   Result Date: 11/03/2019 CLINICAL DATA:  85 year old female with ataxia and weakness. Acute to subacute infarct in the right parietal lobe on head CT today. EXAM: MRI HEAD WITHOUT CONTRAST MRA HEAD WITHOUT CONTRAST TECHNIQUE: Multiplanar, multiecho pulse sequences of the brain and surrounding structures were obtained without intravenous contrast. Angiographic images of the head were obtained using MRA technique without contrast. COMPARISON:  Head CT 1829 hours today.  Brain MRI 01/31/2015. FINDINGS: MRI HEAD FINDINGS Brain: Study is intermittently degraded by motion artifact despite repeated imaging attempts. DWI  reveals widely scattered cortical and subcortical restricted diffusion in the right hemisphere, with confluent areas of involvement both in the right posterior temporal/aparietal lobe, and also the right anterior corona radiata (series 3 image 37). All of the diffusion abnormality seems limited to the right MCA territory. Both axial and coronal DWI is motion degraded but there is no definite left hemisphere or posterior fossa diffusion restriction. T2 and FLAIR hyperintensity in keeping with cytotoxic edema in the widely scattered areas of acute involvement. No associated hemorrhage or mass effect. Superimposed chronic right temporal lobe region CSF cyst which is stable to mildly smaller since 2016. No superimposed midline shift, ventriculomegaly, or acute intracranial hemorrhage. Cervicomedullary junction and pituitary are within normal limits. Chronic encephalomalacia in the left occipital pole is new since 2016. And there are also several small chronic right cerebellar infarcts new from the prior MRI, largest in the SCA territory. There is chronic patchy bilateral white matter T2 and FLAIR hyperintensity which is largely stable. T2 heterogeneity in the deep gray nuclei and pons has not significantly changed. Vascular: Major intracranial vascular flow voids are stable since 2016. The right vertebral artery appears dominant. Skull and upper cervical spine: Upper cervical spine degeneration appears increased since 2016 with up to mild spinal stenosis. Visualized bone marrow signal is within normal limits. Sinuses/Orbits: No acute orbit or paranasal sinus findings. Other: Mastoids are clear. Grossly normal visible internal auditory structures. MRA HEAD FINDINGS Antegrade flow in the posterior circulation. Both distal vertebral arteries appear to remain patent although the right is dominant. Normal right PICA origin. No definite distal vertebral stenosis. Patent vertebrobasilar junction. Patent basilar artery with  mild mid basilar stenosis. SCA and right PCA origins are patent. A left posterior communicating artery is present but the left PCA is occluded. Right PCA is mildly irregular but patent. Asymmetric decreased antegrade flow signal in the right ICA siphon compared to the left. Probably artifactual loss of signal in the anterior genu. The distal ICA is patent. Patent left siphon with no definite left ICA stenosis. Patent left ICA terminus. Dominant left and diminutive or absent right ACA A1 segments such that the right ACA is supplied from the left. Normal anterior communicating artery. Visible ACA branches are within normal limits. Left MCA M1 segment bifurcates early. The visible left MCA branches are patent with no high-grade stenosis identified. The diminished right ICA terminates in the MCA. The right M1 and the MCA bifurcation are patent but with decreased flow. IMPRESSION: 1. Positive for widely scattered acute to subacute  infarcts in the Right MCA territory with corresponding decreased flow in the Right ICA such as due to high-grade carotid stenosis in the neck. The right M1 and MCA bifurcation appear to remain patent. 2. Scattered infarct related cytotoxic edema in the right MCA territory with no associated hemorrhage or mass effect. 3. 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 Several chronic right cerebellar infarcts are also new since that time. 4. Chronic benign right temporal lobe CSF cyst. Chronic small vessel disease. Electronically Signed: By: Genevie Ann M.D. On: 11/03/2019 20:32   US Venous Img Lower Bilateral (DVT)  Result Date: 11/04/2019 CLINICAL DATA:  85 year old female with recent multifocal right MCA territory infarct. Possible PE. EXAM: BILATERAL LOWER EXTREMITY VENOUS DOPPLER ULTRASOUND TECHNIQUE: Gray-scale sonography with graded compression, as well as color Doppler and duplex ultrasound were  performed to evaluate the lower extremity deep venous systems from the level of the common femoral vein and including the common femoral, femoral, profunda femoral, popliteal and calf veins including the posterior tibial, peroneal and gastrocnemius veins when visible. The superficial great saphenous vein was also interrogated. Spectral Doppler was utilized to evaluate flow at rest and with distal augmentation maneuvers in the common femoral, femoral and popliteal veins. COMPARISON:  None. FINDINGS: RIGHT LOWER EXTREMITY Common Femoral Vein: No evidence of thrombus. Normal compressibility, respiratory phasicity and response to augmentation. Saphenofemoral Junction: No evidence of thrombus. Normal compressibility and flow on color Doppler imaging. Profunda Femoral Vein: No evidence of thrombus. Normal compressibility and flow on color Doppler imaging. Femoral Vein: No evidence of thrombus. Normal compressibility, respiratory phasicity and response to augmentation. Popliteal Vein: No evidence of thrombus. Normal compressibility, respiratory phasicity and response to augmentation. Calf Veins: No evidence of thrombus. Normal compressibility and flow on color Doppler imaging. Other Findings:  None. LEFT LOWER EXTREMITY Common Femoral Vein: No evidence of thrombus. Normal compressibility, respiratory phasicity and response to augmentation. Saphenofemoral Junction: No evidence of thrombus. Normal compressibility and flow on color Doppler imaging. Profunda Femoral Vein: No evidence of thrombus. Normal compressibility and flow on color Doppler imaging. Femoral Vein: No evidence of thrombus. Normal compressibility, respiratory phasicity and response to augmentation. Popliteal Vein: No evidence of thrombus. Normal compressibility, respiratory phasicity and response to augmentation. Calf Veins: No evidence of thrombus. Normal compressibility and flow on color Doppler imaging. Other Findings:  None. IMPRESSION: No evidence of  bilateral lower extremity deep venous thrombosis. Electronically Signed   By: Genevie Ann M.D.   On: 11/04/2019 18:06   DG Chest Portable 1 View  Result Date: 11/03/2019 CLINICAL DATA:  Weakness and decreased oral intake EXAM: PORTABLE CHEST 1 VIEW COMPARISON:  07/04/2013 FINDINGS: The heart size and mediastinal contours are within normal limits. Both lungs are clear. The visualized skeletal structures are unremarkable. IMPRESSION: No active disease. Electronically Signed   By: Ulyses Jarred M.D.   On: 11/03/2019 18:55   ECHOCARDIOGRAM COMPLETE  Result Date: 11/04/2019    ECHOCARDIOGRAM REPORT   Patient Name:   RAKEYA GL[facility] Date of Exam: 11/04/2019 Medical Rec #:  [id number]         Height:       66.0 in Accession #:    [id number]        Weight:       140.0 lb Date of Birth:  04-24-1935         BSA:          1.72 m  Patient Age:    19 years          BP:           142/63 mmHg Patient Gender: F                 HR:           62 bpm. Exam Location:  [facility] Procedure: 2D Echo Indications:    Stroke 434.91 / I163.9  History:        Patient has no prior history of Echocardiogram examinations.                 Stroke; Risk Factors:Hypertension, Diabetes, Dyslipidemia and                 Former Smoker. Cancer.  Sonographer:    Leavy Cella RDCS (AE) Referring Phys: [place]  1. Left ventricular ejection fraction, by estimation, is 60 to 65%. The left ventricle has normal function. The left ventricle has no regional wall motion abnormalities. There is mild left ventricular hypertrophy. Left ventricular diastolic parameters are consistent with Grade I diastolic dysfunction (impaired relaxation).  2. Right ventricular systolic function is normal. The right ventricular size is normal. There is normal pulmonary artery systolic pressure. The estimated right ventricular systolic pressure is 97.6 mmHg.  3. Left atrial size was mildly dilated.  4. The mitral valve is grossly normal. Trivial  mitral valve regurgitation.  5. The aortic valve is tricuspid. Aortic valve regurgitation is not visualized.  6. The inferior vena cava is normal in size with greater than 50% respiratory variability, suggesting right atrial pressure of 3 mmHg. FINDINGS  Left Ventricle: Left ventricular ejection fraction, by estimation, is 60 to 65%. The left ventricle has normal function. The left ventricle has no regional wall motion abnormalities. The left ventricular internal cavity size was normal in size. There is  mild left ventricular hypertrophy. Left ventricular diastolic parameters are consistent with Grade I diastolic dysfunction (impaired relaxation). Right Ventricle: The right ventricular size is normal. No increase in right ventricular wall thickness. Right ventricular systolic function is normal. There is normal pulmonary artery systolic pressure. The tricuspid regurgitant velocity is 2.45 m/s, and  with an assumed right atrial pressure of 3 mmHg, the estimated right ventricular systolic pressure is 73.4 mmHg. Left Atrium: Left atrial size was mildly dilated. Right Atrium: Right atrial size was normal in size. Pericardium: There is no evidence of pericardial effusion. Presence of pericardial fat pad. Mitral Valve: The mitral valve is grossly normal. Mild mitral annular calcification. Trivial mitral valve regurgitation. Tricuspid Valve: The tricuspid valve is grossly normal. Tricuspid valve regurgitation is trivial. Aortic Valve: The aortic valve is tricuspid. Aortic valve regurgitation is not visualized. Pulmonic Valve: The pulmonic valve was grossly normal. Pulmonic valve regurgitation is trivial. Aorta: The aortic root is normal in size and structure. Venous: The inferior vena cava is normal in size with greater than 50% respiratory variability, suggesting right atrial pressure of 3 mmHg. IAS/Shunts: No atrial level shunt detected by color flow Doppler.  LEFT VENTRICLE PLAX 2D LVIDd:         3.77 cm  Diastology  LVIDs:         2.60 cm  LV e' lateral:   7.72 cm/s LV PW:         1.09 cm  LV E/e' lateral: 12.4 LV IVS:        1.20 cm  LV e' medial:    6.96 cm/s  LVOT diam:     1.80 cm  LV E/e' medial:  13.7 LV SV Index:   21.00 LVOT Area:     2.54 cm  RIGHT VENTRICLE RV S prime:     10.10 cm/s TAPSE (M-mode): 2.0 cm LEFT ATRIUM             Index       RIGHT ATRIUM           Index LA diam:        3.20 cm 1.86 cm/m  RA Area:     10.80 cm LA Vol (A2C):   32.0 ml 18.62 ml/m RA Volume:   20.20 ml  11.75 ml/m LA Vol (A4C):   63.1 ml 36.72 ml/m LA Biplane Vol: 45.5 ml 26.48 ml/m   AORTA Ao Root diam: 2.40 cm MITRAL VALVE                TRICUSPID VALVE MV Area (PHT): 2.24 cm     TR Peak grad:   24.0 mmHg MV Decel Time: 338 msec     TR Vmax:        245.00 cm/s MV E velocity: 95.60 cm/s MV A velocity: 122.00 cm/s  SHUNTS MV E/A ratio:  0.78         Systemic Diam: 1.80 cm Rozann Lesches MD Electronically signed by Rozann Lesches MD Signature Date/Time: 11/04/2019/11:45:00 AM    Final    VAS US CAROTID  Result Date: 11/05/2019 Carotid Arterial Duplex Study Indications:                           CVA. Risk Factors:                          Hypertension, hyperlipidemia, Diabetes. Pre-Surgical Evaluation & Surgical     Stenosis at RICA only. ICA is normal past Correlation                            the stenosis. Anatomy on the right is                                        within normal limits.Right bifurcation is                                        located near the Hyoid Notch. Performing Technologist: Maudry Mayhew MHA, RDMS, RVT, RDCS  Examination Guidelines: A complete evaluation includes B-mode imaging, spectral Doppler, color Doppler, and power Doppler as needed of all accessible portions of each vessel. Bilateral testing is considered an integral part of a complete examination. Limited examinations for reoccurring indications may be performed as noted.  Right Carotid Findings:  +----------+-------+-------+--------+---------------------------------+--------+             PSV     EDV     Stenosis Plaque Description                Comments              cm/s    cm/s                                                         +----------+-------+-------+--------+---------------------------------+--------+  CCA Prox   71      6                smooth and homogeneous                      +----------+-------+-------+--------+---------------------------------+--------+  CCA Distal 94      8                smooth and homogeneous                      +----------+-------+-------+--------+---------------------------------+--------+  ICA Prox   760     201     80-99%   heterogenous                                +----------+-------+-------+--------+---------------------------------+--------+  ICA Mid    240     35                                                           +----------+-------+-------+--------+---------------------------------+--------+  ICA Distal 28      5                                                            +----------+-------+-------+--------+---------------------------------+--------+  ECA        367             >50%     irregular, heterogenous and                                                      calcific                                    +----------+-------+-------+--------+---------------------------------+--------+ +----------+--------+-------+----------------+-------------------+             PSV cm/s EDV cms Describe         Arm Pressure (mmHG)  +----------+--------+-------+----------------+-------------------+  Subclavian 200              Multiphasic, WNL                      +----------+--------+-------+----------------+-------------------+ +---------+--------+---+--------+--+---------+  Vertebral PSV cm/s 100 EDV cm/s 15 Antegrade  +---------+--------+---+--------+--+---------+  Left Carotid Findings:  +----------+--------+--------+--------+--------------------------+--------+             PSV cm/s EDV cm/s Stenosis Plaque Description         Comments  +----------+--------+--------+--------+--------------------------+--------+  CCA Prox   135      17                                                     +----------+--------+--------+--------+--------------------------+--------+  CCA Distal  55       10                smooth and heterogenous              +----------+--------+--------+--------+--------------------------+--------+  ICA Prox   84       18       1-39%    heterogenous and irregular           +----------+--------+--------+--------+--------------------------+--------+  ICA Distal 137      32                                                     +----------+--------+--------+--------+--------------------------+--------+  ECA        114                                                             +----------+--------+--------+--------+--------------------------+--------+ +----------+--------+--------+----------------+-------------------+             PSV cm/s EDV cm/s Describe         Arm Pressure (mmHG)  +----------+--------+--------+----------------+-------------------+  Subclavian 199               Multiphasic, WNL                      +----------+--------+--------+----------------+-------------------+ +---------+--------+--+--------+---------+  Vertebral PSV cm/s 45 EDV cm/s Antegrade  +---------+--------+--+--------+---------+   Summary: Right Carotid: Velocities in the right ICA are consistent with a 80-99%                stenosis. The ECA appears >50% stenosed. Left Carotid: Velocities in the left ICA are consistent with a 1-39% stenosis. Vertebrals:  Bilateral vertebral arteries demonstrate antegrade flow. Subclavians: Normal flow hemodynamics were seen in bilateral subclavian              arteries. *See table(s) above for measurements and observations.  Electronically signed by [name] MD on  11/05/2019 at 4:51:35 PM.    Final      PERTINENT LAB RESULTS: CBC: Recent Labs    11/08/19 0434 11/09/19 0932  WBC 12.1* 10.4  HGB 9.6* 10.1*  HCT 30.6* 32.1*  PLT 226 248   CMET CMP     Component Value Date/Time   NA 145 11/09/2019 0932   K 4.5 11/09/2019 0932   CL 109 11/09/2019 0932   CO2 26 11/09/2019 0932   GLUCOSE 160 (H) 11/09/2019 0932   BUN 25 (H) 11/09/2019 0932   CREATININE 2.27 (H) 11/09/2019 0932   CALCIUM 9.8 11/09/2019 0932   PROT 6.8 11/09/2019 0932   PROT 6.7 08/02/2019 0753   ALBUMIN 3.0 (L) 11/09/2019 0932   ALBUMIN 4.3 08/02/2019 0753   AST 34 11/09/2019 0932   ALT 24 11/09/2019 0932   ALKPHOS 73 11/09/2019 0932   BILITOT 0.8 11/09/2019 0932   BILITOT 0.3 08/02/2019 0753   GFRNONAA 19 (L) 11/09/2019 0932   GFRAA 23 (L) 11/09/2019 0932    GFR Estimated Creatinine Clearance: 17.9 mL/min (A) (by C-G formula based on SCr of 2.27 mg/dL (H)). No results for input(s): LIPASE, AMYLASE in the last 72 hours. No results for input(s): CKTOTAL, CKMB,  CKMBINDEX, TROPONINI in the last 72 hours. Invalid input(s): POCBNP Recent Labs    11/08/19 0434 11/09/19 0932  DDIMER 2.93* 2.70*   No results for input(s): HGBA1C in the last 72 hours. No results for input(s): CHOL, HDL, LDLCALC, TRIG, CHOLHDL, LDLDIRECT in the last 72 hours. No results for input(s): TSH, T4TOTAL, T3FREE, THYROIDAB in the last 72 hours.  Invalid input(s): FREET3 Recent Labs    11/08/19 0434 11/09/19 0932  FERRITIN 254 297   Coags: No results for input(s): INR in the last 72 hours.  Invalid input(s): PT Microbiology: Recent Results (from the past 240 hour(s))  SARS CORONAVIRUS 2 (TAT 6-24 HRS) Nasopharyngeal Nasopharyngeal Swab     Status: Abnormal   Collection Time: 11/03/19  9:22 PM   Specimen: Nasopharyngeal Swab  Result Value Ref Range Status   SARS Coronavirus 2 POSITIVE (A) NEGATIVE Final    Comment: RESULT CALLED TO, READ BACK BY AND VERIFIED WITH: Jani Gravel RN  14:15 11/04/19 ([email or web address]) (NOTE) SARS-CoV-2 target nucleic acids are DETECTED. The SARS-CoV-2 RNA is generally detectable in upper and lower respiratory specimens during the acute phase of infection. Positive results are indicative of the presence of SARS-CoV-2 RNA. Clinical correlation with patient history and other diagnostic information is  necessary to determine patient infection status. Positive results do not rule out bacterial infection or co-infection with other viruses.  The expected result is Negative. Fact Sheet for Patients: SugarRoll.be Fact Sheet for Healthcare Providers: https://www.woods-mathews.com/ This test is not yet approved or cleared by the [place] FDA and  has been authorized for detection and/or diagnosis of SARS-CoV-2 by FDA under an Emergency Use Authorization (EUA). This EUA will remain  in effect (meaning this test can be used) for th e duration of the COVID-19 declaration under Section 564(b)(1) of the Act, 21 U.S.C. section 360bbb-3(b)(1), unless the authorization is terminated or revoked sooner. Performed at [facility], [place] [address], [place], [place] [address]     FURTHER DISCHARGE INSTRUCTIONS:  Get Medicines reviewed and adjusted: Please take all your medications with you for your next visit with your Primary MD  Laboratory/radiological data: Please request your Primary MD to go over all hospital tests and procedure/radiological results at the follow up, please ask your Primary MD to get all Hospital records sent to his/her office.  In some cases, they will be blood work, cultures and biopsy results pending at the time of your discharge. Please request that your primary care M.D. goes through all the records of your hospital data and follows up on these results.  Also Note the following: If you experience worsening of your admission symptoms, develop shortness of breath, life threatening  emergency, suicidal or homicidal thoughts you must seek medical attention immediately by calling 911 or calling your MD immediately  if symptoms less severe.  You must read complete instructions/literature along with all the possible adverse reactions/side effects for all the Medicines you take and that have been prescribed to you. Take any new Medicines after you have completely understood and accpet all the possible adverse reactions/side effects.   Do not drive when taking Pain medications or sleeping medications (Benzodaizepines)  Do not take more than prescribed Pain, Sleep and Anxiety Medications. It is not advisable to combine anxiety,sleep and pain medications without talking with your primary care practitioner  Special Instructions: If you have smoked or chewed Tobacco  in the last 2 yrs please stop smoking, stop any regular Alcohol  and or any Recreational drug use.  Wear Seat belts while driving.  Please note: You were cared for by a hospitalist during your hospital stay. Once you are discharged, your primary care physician will handle any further medical issues. Please note that NO REFILLS for any discharge medications will be authorized once you are discharged, as it is imperative that you return to your primary care physician (or establish a relationship with a primary care physician if you do not have one) for your post hospital discharge needs so that they can reassess your need for medications and monitor your lab values.  Total Time spent coordinating discharge including counseling, education and face to face time equals 35 minutes.  SignedOren Binet 11/09/2019 12:10 PM

## 2019-11-14 ENCOUNTER — Ambulatory Visit: Payer: PPO | Admitting: Cardiovascular Disease

## 2019-11-15 ENCOUNTER — Telehealth: Payer: Self-pay | Admitting: *Deleted

## 2019-11-15 NOTE — Care Management (Signed)
CM received a call from pt's daughter.  Daughter informed CM that pt has not yet been contacted by [facility] to start [facility].  [facility] accepted referral for pt prior to discharge on 11/09/19.  CM contacted agency - agency to follow up with pt/daughter directly regarding [facility] SOC.

## 2019-11-17 ENCOUNTER — Telehealth: Payer: Self-pay | Admitting: Neurology

## 2019-11-17 NOTE — Telephone Encounter (Signed)
I spoke to her daughter.  Colleen Parks was recently at [facility] for stroke and had a carotid endarterectomy.  She is scheduled to see Frann Rider 12/08/2019.  She is experiencing headaches.  This has not been associated with any change in neurologic status.  I discussed with the daughter that she can take Tylenol 500 mg up to 4 times a day.  If there are any new neurologic symptoms she would need to go to the emergency room.

## 2019-11-21 NOTE — Telephone Encounter (Signed)
Thank you for update.  Agree with recommendation.

## 2019-11-22 NOTE — Telephone Encounter (Signed)
Pt daughter called  to touch base in regards to fridays call

## 2019-11-23 DIAGNOSIS — N184 Chronic kidney disease, stage 4 (severe): Secondary | ICD-10-CM | POA: Diagnosis not present

## 2019-11-23 DIAGNOSIS — E785 Hyperlipidemia, unspecified: Secondary | ICD-10-CM | POA: Diagnosis not present

## 2019-11-23 DIAGNOSIS — I69354 Hemiplegia and hemiparesis following cerebral infarction affecting left non-dominant side: Secondary | ICD-10-CM | POA: Diagnosis not present

## 2019-11-23 DIAGNOSIS — U071 COVID-19: Secondary | ICD-10-CM | POA: Diagnosis not present

## 2019-11-23 DIAGNOSIS — Z48812 Encounter for surgical aftercare following surgery on the circulatory system: Secondary | ICD-10-CM | POA: Diagnosis not present

## 2019-11-23 DIAGNOSIS — E1122 Type 2 diabetes mellitus with diabetic chronic kidney disease: Secondary | ICD-10-CM | POA: Diagnosis not present

## 2019-11-23 DIAGNOSIS — I6521 Occlusion and stenosis of right carotid artery: Secondary | ICD-10-CM | POA: Diagnosis not present

## 2019-11-23 DIAGNOSIS — I701 Atherosclerosis of renal artery: Secondary | ICD-10-CM | POA: Diagnosis not present

## 2019-11-23 DIAGNOSIS — I129 Hypertensive chronic kidney disease with stage 1 through stage 4 chronic kidney disease, or unspecified chronic kidney disease: Secondary | ICD-10-CM | POA: Diagnosis not present

## 2019-11-24 NOTE — Telephone Encounter (Signed)
Can you please reach out to family regarding concerns and can be seen sooner if needed

## 2019-11-24 NOTE — Telephone Encounter (Signed)
She is a patient who was hospitalized a couple weeks ago for stroke and I spoke to when I was the on-call doctor.  She is scheduled to see you later this month.  Could you check with them to see what this is about.

## 2019-11-25 DIAGNOSIS — I639 Cerebral infarction, unspecified: Secondary | ICD-10-CM | POA: Diagnosis not present

## 2019-11-25 DIAGNOSIS — I6521 Occlusion and stenosis of right carotid artery: Secondary | ICD-10-CM | POA: Diagnosis not present

## 2019-11-25 DIAGNOSIS — R4189 Other symptoms and signs involving cognitive functions and awareness: Secondary | ICD-10-CM | POA: Diagnosis not present

## 2019-11-28 NOTE — Telephone Encounter (Signed)
I called and spoke to daughter she said the 12-08-19 appt was fine to keep.

## 2019-11-29 ENCOUNTER — Telehealth (HOSPITAL_COMMUNITY): Payer: Self-pay

## 2019-11-29 ENCOUNTER — Encounter: Payer: Self-pay | Admitting: *Deleted

## 2019-11-29 NOTE — Telephone Encounter (Signed)

## 2019-11-30 ENCOUNTER — Other Ambulatory Visit: Payer: Self-pay

## 2019-11-30 ENCOUNTER — Other Ambulatory Visit: Payer: Self-pay | Admitting: Cardiovascular Disease

## 2019-11-30 ENCOUNTER — Ambulatory Visit (INDEPENDENT_AMBULATORY_CARE_PROVIDER_SITE_OTHER): Payer: Self-pay | Admitting: Vascular Surgery

## 2019-11-30 ENCOUNTER — Encounter: Payer: Self-pay | Admitting: Vascular Surgery

## 2019-11-30 VITALS — BP 116/57 | HR 87 | Temp 97.6°F | Resp 18 | Ht 66.0 in | Wt 137.0 lb

## 2019-11-30 DIAGNOSIS — Z48812 Encounter for surgical aftercare following surgery on the circulatory system: Secondary | ICD-10-CM

## 2019-11-30 DIAGNOSIS — I6523 Occlusion and stenosis of bilateral carotid arteries: Secondary | ICD-10-CM

## 2019-11-30 NOTE — Progress Notes (Signed)
Patient name: Colleen Parks MRN: [id number] DOB: 29-Sep-1934 Sex: female  REASON FOR VISIT:   Follow-up after right carotid endarterectomy.  HPI:   Colleen Parks is a pleasant 85 y.o. female who presented with confusion and was found to have a right brain stroke.  She had multiple scattered infarcts in the middle cerebral artery distribution.  She had a tight right carotid stenosis and right carotid endarterectomy was recommended.  She underwent a right carotid endarterectomy with bovine pericardial patch angioplasty on 11/07/2019.  Of note the plaque extended very low into the common carotid artery.  Of note she had undergone a previous left carotid endarterectomy in 2018.  At the time of her duplex in February the left carotid endarterectomy site was widely patent.  Of note she was Covid positive when I did her.  She comes in for a 2-week follow-up visit.  Since I saw her last she has now at home with her husband and doing well.  She denies any focal weakness or paresthesias.  She has had some generalized weakness which I would expect after being in the hospital with Covid for a while.  She is on aspirin.  She is allergic to statins, but is on Repatha.  Current Outpatient Medications  Medication Sig Dispense Refill  . acetaminophen (TYLENOL) 325 MG tablet Take 325 mg by mouth every 6 (six) hours as needed for mild pain.    Marland Kitchen amLODipine (NORVASC) 5 MG tablet TAKE 1 TABLET BY MOUTH DAILY. 30 tablet 8  . aspirin EC 81 MG EC tablet Take 1 tablet (81 mg total) by mouth daily.    . cyanocobalamin 1000 MCG tablet Take 1,000 mcg by mouth daily.    . Evolocumab (REPATHA SURECLICK) 659 MG/ML SOAJ Inject 1 pen into the skin every 14 (fourteen) days. 2 pen 11  . fenofibrate (TRICOR) 48 MG tablet TAKE 1 TABLET BY MOUTH ONCE A DAY. 90 tablet 3  . FLUoxetine (PROZAC) 10 MG capsule Take 10 mg by mouth daily.    . Multiple Vitamin (MULTIVITAMIN WITH MINERALS) TABS tablet Take 1 tablet by mouth daily.  For over 21    . oxybutynin (DITROPAN-XL) 5 MG 24 hr tablet Take 5 mg by mouth daily.    . pantoprazole (PROTONIX) 40 MG tablet Take 1 tablet by mouth as needed.      No current facility-administered medications for this visit.    REVIEW OF SYSTEMS:  [X]  denotes positive finding, [ ]  denotes negative finding Vascular    Leg swelling    Cardiac    Chest pain or chest pressure:    Shortness of breath upon exertion:    Short of breath when lying flat:    Irregular heart rhythm:    Constitutional    Fever or chills:     PHYSICAL EXAM:   Vitals:   11/30/19 1012  BP: (!) 116/57  Pulse: 87  Resp: 18  Temp: 97.6 F (36.4 C)  SpO2: 98%  Weight: 137 lb (62.1 kg)  Height: 5\' 6"  (1.676 m)    GENERAL: The patient is a well-nourished female, in no acute distress. The vital signs are documented above. CARDIOVASCULAR: There is a regular rate and rhythm. PULMONARY: There is good air exchange bilaterally without wheezing or rales. Right neck incision is healing nicely. I do not detect carotid bruits. NEURO: She has no focal weakness or paresthesias.  DATA:   No new data  MEDICAL ISSUES:   STATUS POST RIGHT CAROTID ENDARTERECTOMY: The  patient is doing well status post right carotid endarterectomy.  She is also undergone previous left carotid endarterectomy in 2018.  At the time of her duplex in February her left carotid endarterectomy site was widely patent.  I have ordered a follow-up carotid duplex scan in 9 months and I will see her back at that time.  She knows to call sooner if she has problems.  She knows to stay on her aspirin.  [name] [facility] [phone]

## 2019-12-01 ENCOUNTER — Encounter: Payer: PPO | Admitting: Vascular Surgery

## 2019-12-01 ENCOUNTER — Other Ambulatory Visit: Payer: Self-pay | Admitting: *Deleted

## 2019-12-01 DIAGNOSIS — I6523 Occlusion and stenosis of bilateral carotid arteries: Secondary | ICD-10-CM

## 2019-12-02 DIAGNOSIS — E119 Type 2 diabetes mellitus without complications: Secondary | ICD-10-CM | POA: Diagnosis not present

## 2019-12-06 NOTE — Progress Notes (Signed)
Patient ID: DAYRIN STALLONE, female   DOB: 1935/02/06, 85 y.o.   MRN: [id number]   Emrys is seen today for F/U of HTN, HLD, DM-2  History of glomerulonephritis resolved with steroids but has residual CRF with CR around 2.3 baseline   Had right brain cyst that caused temporary RUE weakness That is now better   Had left CEA with Dr Doren Custard 11/2016 and right CEA on 11/07/19 She presented with weakness and right MCA infarct  Of note she tested positive for COVID on admission   Intolerant to statins started on Repatha and Tricor with target LDL reached   Also has PVD with right ABI 0.58 and left 0.70   No rest pain chronic stable claudication   ROS: Denies fever, malais, weight loss, blurry vision, decreased visual acuity, cough, sputum, SOB, hemoptysis, pleuritic pain, palpitaitons, heartburn, abdominal pain, melena, lower extremity edema, claudication, or rash.  All other systems reviewed and negative  General: BP (!) 160/80 (BP Location: Right Arm)   Pulse 90   Ht 5\' 6"  (1.676 m)   Wt 138 lb (62.6 kg)   SpO2 95%   BMI 22.27 kg/m  Affect appropriate Healthy:  appears stated age HEENT: post bilateral CEA  Neck supple with no adenopathy JVP normal left CEA  no thyromegaly Lungs clear with no wheezing and good diaphragmatic motion Heart:  S1/S2 no murmur, no rub, gallop or click PMI normal Abdomen: benighn, BS positve, no tenderness, no AAA no bruit.  No HSM or HJR Decreased pedal pulses on right  No edema Neuro non-focal Skin warm and dry No muscular weakness   Current Outpatient Medications  Medication Sig Dispense Refill  . acetaminophen (TYLENOL) 325 MG tablet Take 325 mg by mouth every 6 (six) hours as needed for mild pain.    Marland Kitchen aspirin EC 81 MG EC tablet Take 1 tablet (81 mg total) by mouth daily.    . cyanocobalamin 1000 MCG tablet Take 1,000 mcg by mouth daily.    . Evolocumab (REPATHA SURECLICK) 865 MG/ML SOAJ Inject 1 pen into the skin every 14 (fourteen) days. 2 pen 11   . fenofibrate (TRICOR) 48 MG tablet Take 1 tablet (48 mg total) by mouth daily. Please keep upcoming appt with Dr. Johnsie Cancel in March before anymore refills. Thank you 90 tablet 0  . FLUoxetine (PROZAC) 10 MG capsule Take 10 mg by mouth daily.    . Multiple Vitamin (MULTIVITAMIN WITH MINERALS) TABS tablet Take 1 tablet by mouth daily. For over 50    . pantoprazole (PROTONIX) 40 MG tablet Take 1 tablet by mouth as needed.      No current facility-administered medications for this visit.    Allergies  Codeine, Ezetimibe, Metformin and related, and Statins  Electrocardiogram:  11/04/19 SR rate 72 normal ECG  Assessment and Plan   Carotid:  Left CEA [facility] 11/13/16 right CEA 11/06/19 f/u [facility]   PVD: stable claudication known moderate bilateral decrease in ABI's right worse than left f/u [facility]   HTN:  Well controlled.  Continue current medications and low sodium Dash type diet.    Chol:  Intolerant to statins and zetia started on Repatha f/u lab work ok LDL 71  Glomerulonephritis/CRF:  Baseline Cr 2.3 stable f/u with [place] [facility]   Arachnoid Cyst  Recent CT with stable size and no recurrent UE weakness  GERD  Low carb diet and zantac    DM:  Discussed low carb diet.  Target hemoglobin A1c is 6.5 or less.  Continue current medications.  COVID: tested positive 11/06/19 CXR NAD f/u primary       F/U with [facility] F/U Cardiology in a year   [name]

## 2019-12-08 ENCOUNTER — Ambulatory Visit: Payer: PPO | Admitting: Adult Health

## 2019-12-08 ENCOUNTER — Encounter: Payer: Self-pay | Admitting: Adult Health

## 2019-12-08 ENCOUNTER — Other Ambulatory Visit: Payer: Self-pay

## 2019-12-08 VITALS — BP 119/63 | HR 86 | Temp 97.3°F | Ht 66.0 in | Wt 136.8 lb

## 2019-12-08 DIAGNOSIS — E1159 Type 2 diabetes mellitus with other circulatory complications: Secondary | ICD-10-CM | POA: Diagnosis not present

## 2019-12-08 DIAGNOSIS — I63511 Cerebral infarction due to unspecified occlusion or stenosis of right middle cerebral artery: Secondary | ICD-10-CM

## 2019-12-08 DIAGNOSIS — Z9889 Other specified postprocedural states: Secondary | ICD-10-CM

## 2019-12-08 DIAGNOSIS — E785 Hyperlipidemia, unspecified: Secondary | ICD-10-CM

## 2019-12-08 DIAGNOSIS — I10 Essential (primary) hypertension: Secondary | ICD-10-CM

## 2019-12-08 NOTE — Patient Instructions (Signed)
Continue home health therapy for ongoing improvement   Continue aspirin 81 mg daily  and fenofibrate and repatha  for secondary stroke prevention  Continue to follow with vascular surgery ongoing   Continue to follow up with PCP regarding cholesterol and blood pressure management   Continue to monitor blood pressure at home  Maintain strict control of hypertension with blood pressure goal below 130/90, diabetes with hemoglobin A1c goal below 6.5% and cholesterol with LDL cholesterol (bad cholesterol) goal below 70 mg/dL. I also advised the patient to eat a healthy diet with plenty of whole grains, cereals, fruits and vegetables, exercise regularly and maintain ideal body weight.  Followup in the future with me in 3 months or call earlier if needed       Thank you for coming to see [place] at [facility]. I hope we have been able to provide you high quality care today.  You may receive a patient satisfaction survey over the next few weeks. We would appreciate your feedback and comments so that we may continue to improve ourselves and the health of our patients.

## 2019-12-08 NOTE — Progress Notes (Signed)
Fax confirmation received from [facility] for eye exam notes, most recent.[phone] fax, [phone].

## 2019-12-08 NOTE — Progress Notes (Signed)
[facility] [address] [place]. [place] [address] [phone]       HOSPITAL FOLLOW UP NOTE  Ms. Colleen Parks Date of Birth:  Mar 20, 1935 Medical Record Number:  [id number]   Reason for Referral:  hospital stroke follow up    CHIEF COMPLAINT:  Chief Complaint  Patient presents with  . Follow-up    RM9. with daughter. states that she still has some confusion, but doing a lot better recently.    HPI: Colleen Parks being seen today for in office hospital follow-up regarding right MCA stroke 2/2 right ICA stenosis s/p CEA on 11/03/2019.  History obtained from patient, daughter and chart review. Reviewed all radiology images and labs personally.  Ms. Colleen Parks is a 85 y.o. 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 She did not receive IV t-PA due to late presentation (>4.5 hours from time of onset).  Transfer to [facility] for further neurology evaluation.  Stroke work-up revealed right MCA scattered infarcts likely artery to artery embolic due to right carotid artery high-grade stenosis.  MRA also showed chronic appearing occlusion of left PCA and chronic left PCA encephalomalacia with several chronic right cerebellar infarcts new since prior imaging in 2016.  Carotid Doppler showed right ICA 80-99% stenosis.  Underwent right CEA without complication.  Recommended continuation of aspirin for secondary stroke prevention.  COVID-19 positive.  HTN stable.  LDL 44 and recommended continuation of Repatha with history of statin intolerance.  DM with A1c 7.5.  Other stroke risk factors advanced age, former tobacco use, history of strokes by imaging and OSA not on CPAP.  Discharged home in stable condition with recommendation of home health therapy.  Ms. Colleen Parks is a 85 year old female who is  being seen today for hospital follow-up accompanied by her daughter. She has been doing well since discharge with residual mild LUE weakness but overall greatly improving.  She does continue to participate in home health PT with ongoing improvement of overall balance and generalized muscle strengthening.  Ambulates without assistive device and denies recent falls.  Reports stable cognition without worsening post stroke.  Endorses stable depression/anxiety with ongoing use of Prozac.  Continues on aspirin without bleeding or bruising. Continues on Repatha and fenofibrate for HLD management. Blood pressure today 119/63.  Daughter does report ophthalmology visit on 3/19 with Dr. Madelin Headings in [place], [place] and concerns of decreased peripheral vision and was advised to follow-up with [place] in regards to further evaluation.  Did not receive any additional information in regards to ophthalmology visit and unable to personally review through epic.  Patient denies any visual concerns or impairment.  She does get mildly upset with daughter when speaking of this.  No further concerns at this time      ROS:   14 system review of systems performed and negative with exception of memory loss, confusion, anxiety, depression, imbalance 6  PMH:  Past Medical History:  Diagnosis Date  . ANCA-associated vasculitis ([place])   . Anxiety   . Cancer [facility])     left breast mastectomy  . Diabetes mellitus    no longer on medication  . GERD (gastroesophageal reflux disease)   . Glomerulonephritis   . HTN (hypertension)   . Hypercholesterolemia   . RAS (renal artery stenosis) ([place])   . Sleep apnea    no cpap  use  . Stroke ([place])   . Subclavian arterial stenosis ([facility])     PSH:  Past Surgical History:  Procedure Laterality Date  . BIOPSY THYROID    . CATARACT EXTRACTION W/PHACO  12/18/2011   Procedure: CATARACT EXTRACTION PHACO AND INTRAOCULAR LENS PLACEMENT (IOC);  Surgeon: Tonny Branch, MD;  Location: [facility];  Service:  Ophthalmology;  Laterality: Right;  CDE:17.77  . CATARACT EXTRACTION W/PHACO  01/26/2012   Procedure: CATARACT EXTRACTION PHACO AND INTRAOCULAR LENS PLACEMENT (IOC);  Surgeon: Tonny Branch, MD;  Location: [facility];  Service: Ophthalmology;  Laterality: Left;  CDE 15.23  . COLONOSCOPY N/A 05/10/2014   Procedure: COLONOSCOPY;  Surgeon: Rogene Houston, MD;  Location: [facility];  Service: Endoscopy;  Laterality: N/A;  830  . ENDARTERECTOMY Left 11/13/2016   Procedure: LEFT CAROTID ARTERY ENDARTERECTOMY;  Surgeon: Angelia Mould, MD;  Location: [facility];  Service: Vascular;  Laterality: Left;  . ENDARTERECTOMY Right 11/07/2019   Procedure: RIGHT ENDARTERECTOMY CAROTID;  Surgeon: Angelia Mould, MD;  Location: [facility];  Service: Vascular;  Laterality: Right;  . left breast mastectomy with chemo  1995   . MASTECTOMY Left   . PATCH ANGIOPLASTY Left 11/13/2016   Procedure: PATCH ANGIOPLASTY OF THE LEFT CAROTID ARTERY USING HEMASHEILD PACTH;  Surgeon: Angelia Mould, MD;  Location: [facility] OR;  Service: Vascular;  Laterality: Left;    Social History:  Social History   Socioeconomic History  . Marital status: Married    Spouse name: Not on file  . Number of children: Not on file  . Years of education: Not on file  . Highest education level: Not on file  Occupational History  . Not on file  Tobacco Use  . Smoking status: Former Smoker    Packs/day: 0.50    Years: 4.00    Pack years: 2.00    Types: Cigarettes    Quit date: 12/09/1958    Years since quitting: 61.0  . Smokeless tobacco: Never Used  . Tobacco comment: non-smoker  Substance and Sexual Activity  . Alcohol use: No    Alcohol/week: 0.0 standard drinks  . Drug use: No  . Sexual activity: Yes    Birth control/protection: Post-menopausal  Other Topics Concern  . Not on file  Social History Narrative   Married, active. Husband, Ernie Hew, pt. With CABG 2010.    Social Determinants of Health   Financial Resource Strain:   .  Difficulty of Paying Living Expenses:   Food Insecurity:   . Worried About Charity fundraiser in the Last Year:   . Arboriculturist in the Last Year:   Transportation Needs:   . Film/video editor (Medical):   Marland Kitchen Lack of Transportation (Non-Medical):   Physical Activity:   . Days of Exercise per Week:   . Minutes of Exercise per Session:   Stress:   . Feeling of Stress :   Social Connections:   . Frequency of Communication with Friends and Family:   . Frequency of Social Gatherings with Friends and Family:   . Attends Religious Services:   . Active Member of Clubs or Organizations:   . Attends Archivist Meetings:   Marland Kitchen Marital Status:   Intimate Partner Violence:   . Fear of Current or Ex-Partner:   . Emotionally Abused:   Marland Kitchen Physically Abused:   . Sexually Abused:     Family History:  Family History  Problem Relation Age of Onset  . Coronary artery  disease Other        positive for premature- on fathers side  . Anesthesia problems Neg Hx   . Hypotension Neg Hx   . Malignant hyperthermia Neg Hx   . Pseudochol deficiency Neg Hx     Medications:   Current Outpatient Medications on File Prior to Visit  Medication Sig Dispense Refill  . acetaminophen (TYLENOL) 325 MG tablet Take 325 mg by mouth every 6 (six) hours as needed for mild pain.    Marland Kitchen aspirin EC 81 MG EC tablet Take 1 tablet (81 mg total) by mouth daily.    . cyanocobalamin 1000 MCG tablet Take 1,000 mcg by mouth daily.    . Evolocumab (REPATHA SURECLICK) 892 MG/ML SOAJ Inject 1 pen into the skin every 14 (fourteen) days. 2 pen 11  . fenofibrate (TRICOR) 48 MG tablet Take 1 tablet (48 mg total) by mouth daily. Please keep upcoming appt with Dr. Johnsie Cancel in March before anymore refills. Thank you 90 tablet 0  . FLUoxetine (PROZAC) 10 MG capsule Take 10 mg by mouth daily.    . Multiple Vitamin (MULTIVITAMIN WITH MINERALS) TABS tablet Take 1 tablet by mouth daily. For over 50    . pantoprazole (PROTONIX) 40  MG tablet Take 1 tablet by mouth as needed.      No current facility-administered medications on file prior to visit.    Allergies:   Allergies  Allergen Reactions  . Codeine Nausea And Vomiting  . Ezetimibe Other (See Comments)    Myalgias  . Metformin And Related Other (See Comments)    Had kidney problems and nephrologist told her never to take it again  . Statins Other (See Comments)    Myalgias with rosuvastatin 5mg  daily, lovastatin 20mg  daily, pravastatin 80mg  daily     Physical Exam  Vitals:   12/08/19 1450  BP: 119/63  Pulse: 86  Temp: (!) 97.3 F (36.3 C)  Weight: 136 lb 12.8 oz (62.1 kg)  Height: 5\' 6"  (1.676 m)   Body mass index is 22.08 kg/m. No exam data present  General: well developed, well nourished,  pleasant elderly Caucasian female, seated, in no evident distress Head: head normocephalic and atraumatic.   Neck: supple with no carotid or supraclavicular bruits Cardiovascular: regular rate and rhythm, no murmurs Musculoskeletal: no deformity Skin:  no rash/petichiae Vascular:  Normal pulses all extremities   Neurologic Exam Mental Status: Awake and fully alert.   Normal speech and language.  Oriented to place and time. Recent memory slightly impaired and remote memory intact. Attention span, concentration and fund of knowledge appropriate during visit. Mood and affect appropriate.  Cranial Nerves: Fundoscopic exam reveals sharp disc margins. Pupils equal, briskly reactive to light. Extraocular movements full without nystagmus. Visual fields full to confrontation -unable to appreciate any visual loss or impairment. Hearing intact. Facial sensation intact. Face, tongue, palate moves normally and symmetrically.  Motor: Normal bulk and tone. Normal strength in all tested extremity muscles except slightly decreased left hand dexterity. Sensory.: intact to touch , pinprick , position and vibratory sensation.  Coordination: Rapid alternating movements normal  in all extremities except slightly decreased left hand dexterity. Finger-to-nose and heel-to-shin performed accurately bilaterally. Gait and Station: Arises from chair without difficulty. Stance is normal. Gait demonstrates normal stride length and balance Reflexes: 1+ and symmetric. Toes downgoing.     NIHSS  0 Modified Rankin  2    ASSESSMENT: Colleen Parks is a 85 y.o. year old female presented with 1 week  generalized weakness, poor oral intake and confusion on 11/02/2018 with stroke work-up revealing right MCA scattered infarcts likely artery to artery embolic due to right carotid artery high-grade stenosis s/p right CEA. Vascular risk factors include history of stroke on imaging, history of left CEA 2015, HTN, HLD, and DM.  Residual stroke deficits of mild LUE weakness but overall ongoing improvement.  Baseline dementia stable without worsening of stroke    PLAN:  1. Right MCA stroke: Continue aspirin 81 mg daily  and fenofibrate and Repatha for secondary stroke prevention. Maintain strict control of hypertension with blood pressure goal below 130/90, diabetes with hemoglobin A1c goal below 6.5% and cholesterol with LDL cholesterol (bad cholesterol) goal below 70 mg/dL.  I also advised the patient to eat a healthy diet with plenty of whole grains, cereals, fruits and vegetables, exercise regularly with at least 30 minutes of continuous activity daily and maintain ideal body weight. 2. Right ICA stenosis s/p CEA: Continuation of aspirin and statin.  Continue to follow with vascular surgery with repeat ultrasound in 9 months per [facility] recommendations 3. HTN: Advised to continue current treatment regimen.  Advised to continue to monitor at home along with continued follow-up with PCP for management 4. HLD: Advised to continue current treatment regimen along with continued follow-up with PCP for future prescribing and monitoring of lipid panel 5. DMII: Advised to continue to monitor glucose  levels at home along with continued follow-up with PCP for management and monitoring 6. ?  Visual impairment: Per daughter, ophthalmologist Dr. Madelin Headings reported peripheral impairment and advised further follow-up in our office for evaluation.  Unable to appreciate during today's visit but will request visit information from 3/19 be faxed to office for further review    Follow up in 3 months or call earlier if needed   Greater than 50% of time during this 45 minute visit was spent on counseling, explanation of diagnosis of right MCA stroke, reviewing risk factor management of HTN, HLD and DM, planning of further management along with potential future management, and discussion with patient and family answering all questions.    Frann Rider, AGNP-BC  [facility] [address] [place], [place] [address]  Phone [phone] Fax [phone] Note: This document was prepared with digital dictation and possible smart phrase technology. Any transcriptional errors that result from this process are unintentional.

## 2019-12-12 ENCOUNTER — Telehealth: Payer: Self-pay | Admitting: Adult Health

## 2019-12-12 ENCOUNTER — Other Ambulatory Visit: Payer: Self-pay

## 2019-12-12 ENCOUNTER — Encounter: Payer: Self-pay | Admitting: Cardiovascular Disease

## 2019-12-12 ENCOUNTER — Ambulatory Visit (INDEPENDENT_AMBULATORY_CARE_PROVIDER_SITE_OTHER): Payer: PPO | Admitting: Cardiovascular Disease

## 2019-12-12 VITALS — BP 160/80 | HR 90 | Ht 66.0 in | Wt 138.0 lb

## 2019-12-12 DIAGNOSIS — I739 Peripheral vascular disease, unspecified: Secondary | ICD-10-CM

## 2019-12-12 DIAGNOSIS — I6300 Cerebral infarction due to thrombosis of unspecified precerebral artery: Secondary | ICD-10-CM

## 2019-12-12 NOTE — Telephone Encounter (Signed)
Received fax from Madelin Headings, [place] who evaluated patient at [facility].  In [place], [place] on 12/02/2019.  At recent visit, daughter reported peripheral field concerns that was found by ophthalmologist.  Review of ophthalmology examination visit note:  " Impression/plan: Mild nonproliferative diabetic retinopathy OD no macular edema OS no diabetic retinopathy  Attempted confrontation visual field patient had difficulty following directions, would look towards hands  1.  Nonprophylactic diabetic retinopathy"   Unable to appreciate peripheral issues on confrontation visual field testing during recent office visit.  Likely difficulty with directions due to cognitive impairment vs recent right MCA stroke right posterior temporal/parietal lobe vs chronic stroke locations and encephalomalacia.  No indication for further evaluation at this time as unable to appreciate visual impairment, patient declines visual impairment and further work-up will likely not change current treatment plan.  We will proceed with further evaluation in the future if indicated.     Frann Rider, AGNP-BC  [facility] [address] [place], [place] [address]  Phone [phone] Fax [phone] Note: This document was prepared with digital dictation and possible smart phrase technology. Any transcriptional errors that result from this process are unintentional.

## 2019-12-12 NOTE — Patient Instructions (Signed)
Medication Instructions:  Your physician recommends that you continue on your current medications as directed. Please refer to the Current Medication list given to you today.  *If you need a refill on your cardiac medications before your next appointment, please call your pharmacy*   Lab Work: NONE  If you have labs (blood work) drawn today and your tests are completely normal, you will receive your results only by: MyChart Message (if you have MyChart) OR A paper copy in the mail If you have any lab test that is abnormal or we need to change your treatment, we will call you to review the results.   Testing/Procedures: NONE    Follow-Up: At [facility], you and your health needs are our priority.  As part of our continuing mission to provide you with exceptional heart care, we have created designated Provider Care Teams.  These Care Teams include your primary Cardiologist (physician) and Advanced Practice Providers (APPs -  Physician Assistants and Nurse Practitioners) who all work together to provide you with the care you need, when you need it.  We recommend signing up for the patient portal called "MyChart".  Sign up information is provided on this After Visit Summary.  MyChart is used to connect with patients for Virtual Visits (Telemedicine).  Patients are able to view lab/test results, encounter notes, upcoming appointments, etc.  Non-urgent messages can be sent to your provider as well.   To learn more about what you can do with MyChart, go to [email or web address].    Your next appointment:   6 month(s)  The format for your next appointment:   In Person  Provider:   Peter Nishan, MD   Other Instructions Thank you for choosing [facility]!    

## 2019-12-13 ENCOUNTER — Other Ambulatory Visit: Payer: Self-pay | Admitting: Nurse Practitioner

## 2019-12-13 ENCOUNTER — Ambulatory Visit (HOSPITAL_COMMUNITY)
Admission: RE | Admit: 2019-12-13 | Discharge: 2019-12-13 | Disposition: A | Discharge status: Home / Self Care | Payer: PPO | Source: Ambulatory Visit | Attending: Nurse Practitioner | Admitting: Nurse Practitioner

## 2019-12-13 DIAGNOSIS — R109 Unspecified abdominal pain: Secondary | ICD-10-CM | POA: Diagnosis not present

## 2019-12-13 DIAGNOSIS — R4189 Other symptoms and signs involving cognitive functions and awareness: Secondary | ICD-10-CM | POA: Diagnosis not present

## 2019-12-13 DIAGNOSIS — N184 Chronic kidney disease, stage 4 (severe): Secondary | ICD-10-CM | POA: Diagnosis not present

## 2019-12-16 NOTE — Progress Notes (Signed)
I agree with the above plan 

## 2020-01-10 DIAGNOSIS — I10 Essential (primary) hypertension: Secondary | ICD-10-CM | POA: Diagnosis not present

## 2020-01-10 DIAGNOSIS — R454 Irritability and anger: Secondary | ICD-10-CM | POA: Diagnosis not present

## 2020-01-10 DIAGNOSIS — E782 Mixed hyperlipidemia: Secondary | ICD-10-CM | POA: Diagnosis not present

## 2020-01-10 DIAGNOSIS — N184 Chronic kidney disease, stage 4 (severe): Secondary | ICD-10-CM | POA: Diagnosis not present

## 2020-01-11 ENCOUNTER — Other Ambulatory Visit: Payer: Self-pay | Admitting: Nurse Practitioner

## 2020-01-11 ENCOUNTER — Other Ambulatory Visit (HOSPITAL_COMMUNITY): Payer: Self-pay | Admitting: Nurse Practitioner

## 2020-01-11 DIAGNOSIS — E119 Type 2 diabetes mellitus without complications: Secondary | ICD-10-CM | POA: Diagnosis not present

## 2020-01-11 DIAGNOSIS — F419 Anxiety disorder, unspecified: Secondary | ICD-10-CM | POA: Diagnosis not present

## 2020-01-11 DIAGNOSIS — I10 Essential (primary) hypertension: Secondary | ICD-10-CM | POA: Diagnosis not present

## 2020-01-11 DIAGNOSIS — Z8673 Personal history of transient ischemic attack (TIA), and cerebral infarction without residual deficits: Secondary | ICD-10-CM

## 2020-01-11 DIAGNOSIS — E782 Mixed hyperlipidemia: Secondary | ICD-10-CM | POA: Diagnosis not present

## 2020-01-11 DIAGNOSIS — E1165 Type 2 diabetes mellitus with hyperglycemia: Secondary | ICD-10-CM | POA: Diagnosis not present

## 2020-02-06 ENCOUNTER — Ambulatory Visit (HOSPITAL_COMMUNITY): Payer: PPO

## 2020-02-06 ENCOUNTER — Encounter (HOSPITAL_COMMUNITY): Payer: Self-pay

## 2020-02-08 ENCOUNTER — Encounter (HOSPITAL_COMMUNITY): Payer: PPO

## 2020-02-08 ENCOUNTER — Ambulatory Visit: Payer: PPO | Admitting: Vascular Surgery

## 2020-02-20 ENCOUNTER — Other Ambulatory Visit: Payer: Self-pay | Admitting: Cardiovascular Disease

## 2020-02-21 ENCOUNTER — Ambulatory Visit (HOSPITAL_COMMUNITY)
Admission: RE | Admit: 2020-02-21 | Discharge: 2020-02-21 | Disposition: A | Discharge status: Home / Self Care | Payer: PPO | Source: Ambulatory Visit | Attending: Nurse Practitioner | Admitting: Nurse Practitioner

## 2020-02-21 ENCOUNTER — Other Ambulatory Visit: Payer: Self-pay

## 2020-02-21 DIAGNOSIS — Z8673 Personal history of transient ischemic attack (TIA), and cerebral infarction without residual deficits: Secondary | ICD-10-CM | POA: Insufficient documentation

## 2020-02-21 DIAGNOSIS — I639 Cerebral infarction, unspecified: Secondary | ICD-10-CM | POA: Diagnosis not present

## 2020-02-21 DIAGNOSIS — G9389 Other specified disorders of brain: Secondary | ICD-10-CM | POA: Diagnosis not present

## 2020-02-24 DIAGNOSIS — R410 Disorientation, unspecified: Secondary | ICD-10-CM | POA: Diagnosis not present

## 2020-02-24 DIAGNOSIS — I639 Cerebral infarction, unspecified: Secondary | ICD-10-CM | POA: Diagnosis not present

## 2020-02-24 DIAGNOSIS — I7789 Other specified disorders of arteries and arterioles: Secondary | ICD-10-CM | POA: Diagnosis not present

## 2020-02-24 DIAGNOSIS — G930 Cerebral cysts: Secondary | ICD-10-CM | POA: Diagnosis not present

## 2020-03-14 DIAGNOSIS — N184 Chronic kidney disease, stage 4 (severe): Secondary | ICD-10-CM | POA: Diagnosis not present

## 2020-03-14 DIAGNOSIS — I776 Arteritis, unspecified: Secondary | ICD-10-CM | POA: Diagnosis not present

## 2020-03-14 DIAGNOSIS — I129 Hypertensive chronic kidney disease with stage 1 through stage 4 chronic kidney disease, or unspecified chronic kidney disease: Secondary | ICD-10-CM | POA: Diagnosis not present

## 2020-03-14 DIAGNOSIS — R801 Persistent proteinuria, unspecified: Secondary | ICD-10-CM | POA: Diagnosis not present

## 2020-03-26 ENCOUNTER — Ambulatory Visit: Payer: PPO | Admitting: Adult Health

## 2020-03-26 ENCOUNTER — Other Ambulatory Visit: Payer: Self-pay

## 2020-03-26 ENCOUNTER — Encounter: Payer: Self-pay | Admitting: Adult Health

## 2020-03-26 VITALS — BP 169/80 | HR 84 | Ht 66.0 in | Wt 138.0 lb

## 2020-03-26 DIAGNOSIS — I10 Essential (primary) hypertension: Secondary | ICD-10-CM

## 2020-03-26 DIAGNOSIS — F0151 Vascular dementia with behavioral disturbance: Secondary | ICD-10-CM

## 2020-03-26 DIAGNOSIS — Z7409 Other reduced mobility: Secondary | ICD-10-CM | POA: Diagnosis not present

## 2020-03-26 DIAGNOSIS — F01518 Vascular dementia, unspecified severity, with other behavioral disturbance: Secondary | ICD-10-CM

## 2020-03-26 DIAGNOSIS — E785 Hyperlipidemia, unspecified: Secondary | ICD-10-CM

## 2020-03-26 DIAGNOSIS — I63511 Cerebral infarction due to unspecified occlusion or stenosis of right middle cerebral artery: Secondary | ICD-10-CM | POA: Diagnosis not present

## 2020-03-26 DIAGNOSIS — E1159 Type 2 diabetes mellitus with other circulatory complications: Secondary | ICD-10-CM | POA: Diagnosis not present

## 2020-03-26 NOTE — Progress Notes (Signed)
I agree with the above plan 

## 2020-03-26 NOTE — Patient Instructions (Addendum)
Recommend starting physical therapy for worsening balance and ambulation - you will be called to schedule initial visit  You may also be having side effects from ativan and prozac. Would recommend discontinuing use of ativan and starting on Seroquel or Depakote which can help with behaviors and have less side effects - please discuss further with PCP or cal office if you wish to proceed  Continue aspirin 81 mg daily  and repatha and tricor  for secondary stroke prevention  Continue to follow up with PCP regarding cholesterol and blood pressure management   Maintain strict control of hypertension with blood pressure goal below 130/90, diabetes with hemoglobin A1c goal below 6.5% and cholesterol with LDL cholesterol (bad cholesterol) goal below 70 mg/dL. I also advised the patient to eat a healthy diet with plenty of whole grains, cereals, fruits and vegetables, exercise regularly and maintain ideal body weight.   Followup in the future with me in 3 months or call earlier if needed       Thank you for coming to see [place] at [facility]. I hope we have been able to provide you high quality care today.  You may receive a patient satisfaction survey over the next few weeks. We would appreciate your feedback and comments so that we may continue to improve ourselves and the health of our patients.     Valproic Acid, Divalproex Sodium delayed or extended-release tablets What is this medicine? DIVALPROEX SODIUM (dye VAL pro ex SO dee um) is used to prevent seizures caused by some forms of epilepsy. It is also used to treat bipolar mania and to prevent migraine headaches. This medicine may be used for other purposes; ask your health care provider or pharmacist if you have questions. COMMON BRAND NAME(S): Depakote, Depakote ER What should I tell my health care provider before I take this medicine? They need to know if you have any of these conditions:  if you often drink  alcohol  kidney disease  liver disease  low platelet counts  mitochondrial disease  suicidal thoughts, plans, or attempt; a previous suicide attempt by you or a family member  urea cycle disorder (UCD)  an unusual or allergic reaction to divalproex sodium, sodium valproate, valproic acid, other medicines, foods, dyes, or preservatives  pregnant or trying to get pregnant  breast-feeding How should I use this medicine? Take this medicine by mouth with a drink of water. Follow the directions on the prescription label. Do not cut, crush or chew this medicine. You can take it with or without food. If it upsets your stomach, take it with food. Take your medicine at regular intervals. Do not take it more often than directed. Do not stop taking except on your doctor's advice. A special MedGuide will be given to you by the pharmacist with each prescription and refill. Be sure to read this information carefully each time. Talk to your pediatrician regarding the use of this medicine in children. While this drug may be prescribed for children as young as 10 years for selected conditions, precautions do apply. Overdosage: If you think you have taken too much of this medicine contact a poison control center or emergency room at once. NOTE: This medicine is only for you. Do not share this medicine with others. What if I miss a dose? If you miss a dose, take it as soon as you can. If it is almost time for your next dose, take only that dose. Do not take double or extra doses.  What may interact with this medicine? Do not take this medicine with any of the following medications:  sodium phenylbutyrate This medicine may also interact with the following medications:  aspirin  certain antibiotics like ertapenem, imipenem, meropenem  certain medicines for depression, anxiety, or psychotic disturbances  certain medicines for seizures like carbamazepine, clonazepam, diazepam, ethosuximide, felbamate,  lamotrigine, phenobarbital, phenytoin, primidone, rufinamide, topiramate  certain medicines that treat or prevent blood clots like warfarin  cholestyramine  female hormones, like estrogens and birth control pills, patches, or rings  propofol  rifampin  ritonavir  tolbutamide  zidovudine This list may not describe all possible interactions. Give your health care provider a list of all the medicines, herbs, non-prescription drugs, or dietary supplements you use. Also tell them if you smoke, drink alcohol, or use illegal drugs. Some items may interact with your medicine. What should I watch for while using this medicine? Tell your doctor or health care provider if your symptoms do not get better or they start to get worse. This medicine may cause serious skin reactions. They can happen weeks to months after starting the medicine. Contact your health care provider right away if you notice fevers or flu-like symptoms with a rash. The rash may be red or purple and then turn into blisters or peeling of the skin. Or, you might notice a red rash with swelling of the face, lips or lymph nodes in your neck or under your arms. Wear a medical ID bracelet or chain, and carry a card that describes your disease and details of your medicine and dosage times. You may get drowsy, dizzy, or have blurred vision. Do not drive, use machinery, or do anything that needs mental alertness until you know how this medicine affects you. To reduce dizzy or fainting spells, do not sit or stand up quickly, especially if you are an older patient. Alcohol can increase drowsiness and dizziness. Avoid alcoholic drinks. This medicine can make you more sensitive to the sun. Keep out of the sun. If you cannot avoid being in the sun, wear protective clothing and use sunscreen. Do not use sun lamps or tanning beds/booths. Patients and their families should watch out for new or worsening depression or thoughts of suicide. Also watch  out for sudden changes in feelings such as feeling anxious, agitated, panicky, irritable, hostile, aggressive, impulsive, severely restless, overly excited and hyperactive, or not being able to sleep. If this happens, especially at the beginning of treatment or after a change in dose, call your health care provider. Women should inform their doctor if they wish to become pregnant or think they might be pregnant. There is a potential for serious side effects to an unborn child. Talk to your health care provider or pharmacist for more information. Women who become pregnant while using this medicine may enroll in the [place] Pregnancy Registry by calling [id number]. This registry collects information about the safety of antiepileptic drug use during pregnancy. This medicine may cause a decrease in folic acid and vitamin D. You should make sure that you get enough vitamins while you are taking this medicine. Discuss the foods you eat and the vitamins you take with your health care provider. What side effects may I notice from receiving this medicine? Side effects that you should report to your doctor or health care professional as soon as possible:  allergic reactions like skin rash, itching or hives, swelling of the face, lips, or tongue  changes in vision  rash, fever,  and swollen lymph nodes  redness, blistering, peeling or loosening of the skin, including inside the mouth  signs and symptoms of liver injury like dark yellow or brown urine; general ill feeling or flu-like symptoms; light-colored stools; loss of appetite; nausea; right upper belly pain; unusually weak or tired; yellowing of the eyes or skin  suicidal thoughts or other mood changes  unusual bleeding or bruising Side effects that usually do not require medical attention (report to your doctor or health care professional if they continue or are bothersome):  constipation  diarrhea  dizziness  hair  loss  headache  loss of appetite  weight gain This list may not describe all possible side effects. Call your doctor for medical advice about side effects. You may report side effects to FDA at 1-800-FDA-1088. Where should I keep my medicine? Keep out of reach of children. Store at room temperature between 15 and 30 degrees C (59 and 86 degrees F). Keep container tightly closed. Throw away any unused medicine after the expiration date. NOTE: This sheet is a summary. It may not cover all possible information. If you have questions about this medicine, talk to your doctor, pharmacist, or health care provider.  2020 Elsevier/Gold Standard (2018-12-10 16:03:42)       Quetiapine tablets What is this medicine? QUETIAPINE (kwe TYE a peen) is an antipsychotic. It is used to treat schizophrenia and bipolar disorder, also known as manic-depression. This medicine may be used for other purposes; ask your health care provider or pharmacist if you have questions. COMMON BRAND NAME(S): Seroquel What should I tell my health care provider before I take this medicine? They need to know if you have any of these conditions:  blockage in your bowel  cataracts  constipation  dehydration  diabetes  difficulty swallowing  glaucoma  heart disease  history of breast cancer  kidney disease  liver disease  low blood counts, like low white cell, platelet, or red cell counts  low blood pressure or dizziness when standing up  Parkinson's disease  previous heart attack  prostate disease  seizures  stomach or intestine problems  suicidal thoughts, plans or attempt; a previous suicide attempt by you or a family member  thyroid disease  trouble passing urine  an unusual or allergic reaction to quetiapine, other medicines, foods, dyes, or preservatives  pregnant or trying to get pregnant  breast-feeding How should I use this medicine? Take this medicine by mouth. Swallow it  with a drink of water. Follow the directions on the prescription label. If it upsets your stomach you can take it with food. Take your medicine at regular intervals. Do not take it more often than directed. Do not stop taking except on the advice of your doctor or health care professional. A special MedGuide will be given to you by the pharmacist with each prescription and refill. Be sure to read this information carefully each time. Talk to your pediatrician regarding the use of this medicine in children. While this drug may be prescribed for children as young as 10 years for selected conditions, precautions do apply. Patients over age 48 years may have a stronger reaction to this medicine and need smaller doses. Overdosage: If you think you have taken too much of this medicine contact a poison control center or emergency room at once. NOTE: This medicine is only for you. Do not share this medicine with others. What if I miss a dose? If you miss a dose, take it as soon as  you can. If it is almost time for your next dose, take only that dose. Do not take double or extra doses. What may interact with this medicine? Do not take this medicine with any of the following medications:  cisapride  dronedarone  fluconazole  metoclopramide  pimozide  posaconazole  thioridazine This medicine may also interact with the following medications:  alcohol  antihistamines for allergy cough and cold  antiviral medicines for HIV or AIDS  atropine  certain medicines for bladder problems like oxybutynin, tolterodine  certain medicines for blood pressure  certain medicines for depression, anxiety, or psychotic disturbances  certain medicines for diabetes  certain medicines for stomach problems like dicyclomine, hyoscyamine  certain medicines for travel sickness like scopolamine  certain medicines for Parkinson's disease  certain medicines for seizures like carbamazepine, phenobarbital,  phenytoin  cimetidine  erythromycin  ipratropium  other medicines that prolong the QT interval (cause an abnormal heart rhythm) like dofetilide  rifampin  steroid medicines like prednisone or cortisone This list may not describe all possible interactions. Give your health care provider a list of all the medicines, herbs, non-prescription drugs, or dietary supplements you use. Also tell them if you smoke, drink alcohol, or use illegal drugs. Some items may interact with your medicine. What should I watch for while using this medicine? Visit your health care professional for regular checks on your progress. Tell your health care professional if symptoms do not start to get better or if they get worse. Do not stop taking except on your health care professional's advice. You may develop a severe reaction. Your health care professional will tell you how much medicine to take. You may need to have an eye exam before and during use of this medicine. This medicine may increase blood sugar. Ask your health care provider if changes in diet or medicines are needed if you have diabetes. Patients and their families should watch out for new or worsening depression or thoughts of suicide. Also watch out for sudden or severe changes in feelings such as feeling anxious, agitated, panicky, irritable, hostile, aggressive, impulsive, severely restless, overly excited and hyperactive, or not being able to sleep. If this happens, especially at the beginning of antidepressant treatment or after a change in dose, call your health care professional. Dennis Bast may get dizzy or drowsy. Do not drive, use machinery, or do anything that needs mental alertness until you know how this medicine affects you. Do not stand or sit up quickly, especially if you are an older patient. This reduces the risk of dizzy or fainting spells. Alcohol may interfere with the effect of this medicine. Avoid alcoholic drinks. This drug can cause problems  with controlling your body temperature. It can lower the response of your body to cold temperatures. If possible, stay indoors during cold weather. If you must go outdoors, wear warm clothes. It can also lower the response of your body to heat. Do not overheat. Do not over-exercise. Stay out of the sun when possible. If you must be in the sun, wear cool clothing. Drink plenty of water. If you have trouble controlling your body temperature, call your health care provider right away. What side effects may I notice from receiving this medicine? Side effects that you should report to your doctor or health care professional as soon as possible:  allergic reactions like skin rash, itching or hives, swelling of the face, lips, or tongue  breathing problems  changes in vision  confusion  elevated mood, decreased need  for sleep, racing thoughts, impulsive behavior  eye pain  fast, irregular heartbeat  fever or chills, sore throat  inability to keep still  males: prolonged or painful erection  problems with balance, talking, walking  redness, blistering, peeling, or loosening of the skin, including inside the mouth  seizures  signs and symptoms of high blood sugar such as being more thirsty or hungry or having to urinate more than normal. You may also feel very tired or have blurry vision  signs and symptoms of hypothyroidism like fatigue; increased sensitivity to cold; weight gain; hoarseness; thinning hair  signs and symptoms of low blood pressure like dizziness; feeling faint or lightheaded; falls; unusually weak or tired  signs and symptoms of neuroleptic malignant syndrome like confusion; fast, irregular heartbeat; high fever; increased sweating; stiff muscles  sudden numbness or weakness of the face, arm, or leg  suicidal thoughts or other mood changes  trouble swallowing  uncontrollable movements of the arms, face, head, mouth, neck, or upper body Side effects that usually  do not require medical attention (report to your doctor or health care professional if they continue or are bothersome):  change in sex drive or performance  constipation  drowsiness  dry mouth  upset stomach  weight gain This list may not describe all possible side effects. Call your doctor for medical advice about side effects. You may report side effects to FDA at 1-800-FDA-1088. Where should I keep my medicine? Keep out of the reach of children. Store at room temperature between 15 and 30 degrees C (59 and 86 degrees F). Throw away any unused medicine after the expiration date. NOTE: This sheet is a summary. It may not cover all possible information. If you have questions about this medicine, talk to your doctor, pharmacist, or health care provider.  2020 Elsevier/Gold Standard (2019-06-29 11:59:11)

## 2020-03-26 NOTE — Progress Notes (Signed)
[facility] [address] [place]. [place] [address] [phone]       STROKE FOLLOW UP NOTE  Ms. [name] Date of Birth:  February 17, 1935 Medical Record Number:  [id number]   Reason for Referral:  stroke follow up    CHIEF COMPLAINT:  Chief Complaint  Patient presents with  . Follow-up    tw rm, with daughter, daughter reports no improvements, reports several falls , most resent fall 1 week ago, would like to discuss mri results      HPI:  Today, 03/26/2020, Ms. Davidovich is being seen for stroke follow-up accompanied by her daughter.  Reports over the past 2 months, she has experienced increased confusion, fatigue/lethargy, cognitive decline, and increased falls.  Evaluated by PCP with MRI negative for acute infarct or abnormality.  Lab work was also obtained but unable to view via epic.  Reported behaviors including visual hallucinations, agitation, wandering, insomnia, and sundowning.  Per daughter, PCP increased fluoxetine from 10 mg to 20 mg daily and initiated lorazepam 0.5 mg twice daily around April or May due to behavioral concerns.  She does admit to sedentary lifestyle with limited daily activity or exercise.  Prior residual stroke deficits of mild LUE weakness which has been stable with reported imbalance at prior visit post stroke with gradual improvement.  No other stroke related symptoms.  Continues on aspirin 81 mg daily, Repatha and TriCor for secondary stroke prevention.  No further concerns at this time.      History provided for reference purposes only Initial visit 12/08/2019 JM: Ms. Bomba is a 85 year old female who is being seen today for hospital follow-up accompanied by her daughter. She has been doing well since discharge with residual mild LUE weakness but overall greatly improving.  She does continue to participate in home health PT with ongoing improvement of overall balance and generalized muscle strengthening.  Ambulates without  assistive device and denies recent falls.  Reports stable cognition without worsening post stroke.  Endorses stable depression/anxiety with ongoing use of Prozac.  Continues on aspirin without bleeding or bruising. Continues on Repatha and fenofibrate for HLD management. Blood pressure today 119/63.  Daughter does report ophthalmology visit on 3/19 with Dr. Madelin Headings in [place], [place] and concerns of decreased peripheral vision and was advised to follow-up with [place] in regards to further evaluation.  Did not receive any additional information in regards to ophthalmology visit and unable to personally review through epic.  Patient denies any visual concerns or impairment.  She does get mildly upset with daughter when speaking of this.  No further concerns at this time  Stroke admission 11/03/2019: Ms. ARDIE DRAGOO is a 85 y.o. 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 She did not receive IV t-PA due to late presentation (>4.5 hours from time of onset).  Transfer to [facility] for further neurology evaluation.  Stroke work-up revealed right MCA scattered infarcts likely artery to artery embolic due to right carotid artery high-grade stenosis.  MRA also showed chronic appearing occlusion of left PCA and chronic left PCA encephalomalacia with several chronic right cerebellar infarcts new since prior imaging in 2016.  Carotid Doppler showed right ICA 80-99% stenosis.  Underwent right CEA without complication.  Recommended continuation of aspirin for secondary stroke prevention.  COVID-19 positive.  HTN stable.  LDL 44 and recommended continuation of  Repatha with history of statin intolerance.  DM with A1c 7.5.  Other stroke risk factors advanced age, former tobacco use, history of strokes by imaging and OSA not on CPAP.  Discharged home in  stable condition with recommendation of home health therapy.  Stroke: Rt MCA scattered infarcts likely artery to artery embolic due to right carotid artery high-grade stenosis.  CT head - Area of hypoattenuation with loss of gray-white differentiation in the right parietal lobe, concerning for acute to early subacute infarct. Old right temporal lobe infarct and findings of chronic microvascular ischemia  MRI and MRA head - subacute infarcts in the Right MCA territory with corresponding decreased flow in the Right ICA such as due to high-grade carotid stenosis in the neck. Infarct related cytotoxic edema in the right MCA territory. 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 Several chronic right cerebellar infarcts are also new since that time.  Carotid Doppler - right ICA consistent with an 80-99% stenosis.   2D Echo EF 60-65%  LDL - 44  HgbA1c - 7.5  VTE prophylaxis - [place] Heparin  No antithrombotic prior to admission, now on aspirin 325 mg daily. Further antiplatelet regimen per [facility].   Patient counseled to be compliant with her antithrombotic medications  Ongoing aggressive stroke risk factor management     ROS:   14 system review of systems performed and negative with exception of memory loss, confusion, anxiety, depression, imbalance  PMH:  Past Medical History:  Diagnosis Date  . ANCA-associated vasculitis ([place])   . Anxiety   . Cancer [facility])     left breast mastectomy  . Diabetes mellitus    no longer on medication  . GERD (gastroesophageal reflux disease)   . Glomerulonephritis   . HTN (hypertension)   . Hypercholesterolemia   . RAS (renal artery stenosis) ([place])   . Sleep apnea    no cpap use  . Stroke ([place])   . Subclavian arterial stenosis ([facility])     PSH:  Past Surgical History:  Procedure Laterality Date  . BIOPSY THYROID    . CATARACT EXTRACTION W/PHACO  12/18/2011    Procedure: CATARACT EXTRACTION PHACO AND INTRAOCULAR LENS PLACEMENT (IOC);  Surgeon: Tonny Branch, MD;  Location: [facility];  Service: Ophthalmology;  Laterality: Right;  CDE:17.77  . CATARACT EXTRACTION W/PHACO  01/26/2012   Procedure: CATARACT EXTRACTION PHACO AND INTRAOCULAR LENS PLACEMENT (IOC);  Surgeon: Tonny Branch, MD;  Location: [facility];  Service: Ophthalmology;  Laterality: Left;  CDE 15.23  . COLONOSCOPY N/A 05/10/2014   Procedure: COLONOSCOPY;  Surgeon: Rogene Houston, MD;  Location[facility];  Service: Endoscopy;  Laterality: N/A;  830  . ENDARTERECTOMY Left 11/13/2016   Procedure: LEFT CAROTID ARTERY ENDARTERECTOMY;  Surgeon: Angelia Mould, MD;  Location: [place];  Service: Vascular;  Laterality: Left;  . ENDARTERECTOMY Right 11/07/2019   Procedure: RIGHT ENDARTERECTOMY CAROTID;  Surgeon: Angelia Mould, MD;  Location: [place];  Service: Vascular;  Laterality: Right;  . left breast mastectomy with chemo  1995   . MASTECTOMY Left   . PATCH ANGIOPLASTY Left 11/13/2016   Procedure: PATCH ANGIOPLASTY OF THE LEFT CAROTID ARTERY USING HEMASHEILD PACTH;  Surgeon: Angelia Mould, MD;  Location: [facility];  Service: Vascular;  Laterality: Left;    Social History:  Social History   Socioeconomic History  . Marital status: Married    Spouse name: Not on file  . Number  of children: Not on file  . Years of education: Not on file  . Highest education level: Not on file  Occupational History  . Not on file  Tobacco Use  . Smoking status: Former Smoker    Packs/day: 0.50    Years: 4.00    Pack years: 2.00    Types: Cigarettes    Quit date: 12/09/1958    Years since quitting: 61.3  . Smokeless tobacco: Never Used  . Tobacco comment: non-smoker  Vaping Use  . Vaping Use: Never used  Substance and Sexual Activity  . Alcohol use: No    Alcohol/week: 0.0 standard drinks  . Drug use: No  . Sexual activity: Yes    Birth control/protection: Post-menopausal  Other Topics Concern   . Not on file  Social History Narrative   Married, active. Husband, Ernie Hew, pt. With CABG 2010.    Social Determinants of Health   Financial Resource Strain:   . Difficulty of Paying Living Expenses:   Food Insecurity:   . Worried About Charity fundraiser in the Last Year:   . Arboriculturist in the Last Year:   Transportation Needs:   . Film/video editor (Medical):   Marland Kitchen Lack of Transportation (Non-Medical):   Physical Activity:   . Days of Exercise per Week:   . Minutes of Exercise per Session:   Stress:   . Feeling of Stress :   Social Connections:   . Frequency of Communication with Friends and Family:   . Frequency of Social Gatherings with Friends and Family:   . Attends Religious Services:   . Active Member of Clubs or Organizations:   . Attends Archivist Meetings:   Marland Kitchen Marital Status:   Intimate Partner Violence:   . Fear of Current or Ex-Partner:   . Emotionally Abused:   Marland Kitchen Physically Abused:   . Sexually Abused:     Family History:  Family History  Problem Relation Age of Onset  . Coronary artery disease Other        positive for premature- on fathers side  . Anesthesia problems Neg Hx   . Hypotension Neg Hx   . Malignant hyperthermia Neg Hx   . Pseudochol deficiency Neg Hx     Medications:   Current Outpatient Medications on File Prior to Visit  Medication Sig Dispense Refill  . aspirin EC 81 MG EC tablet Take 1 tablet (81 mg total) by mouth daily.    . cyanocobalamin 1000 MCG tablet Take 1,000 mcg by mouth daily.    . Evolocumab (REPATHA SURECLICK) 098 MG/ML SOAJ Inject 1 pen into the skin every 14 (fourteen) days. 2 pen 11  . fenofibrate (TRICOR) 48 MG tablet TAKE 1 TABLET BY MOUTH ONCE A DAY PLEASE KEEP UPCOMING APPT WITH DR.NISHAN. 90 tablet 1  . FLUoxetine (PROZAC) 10 MG capsule Take 10 mg by mouth daily.    Marland Kitchen FLUoxetine (PROZAC) 20 MG tablet Take 20 mg by mouth daily.    Marland Kitchen LORazepam (ATIVAN) 0.5 MG tablet Take 0.5 mg by mouth daily.     . Multiple Vitamin (MULTIVITAMIN WITH MINERALS) TABS tablet Take 1 tablet by mouth daily. For over 50     No current facility-administered medications on file prior to visit.    Allergies:   Allergies  Allergen Reactions  . Codeine Nausea And Vomiting  . Ezetimibe Other (See Comments)    Myalgias  . Metformin And Related Other (See Comments)    Had kidney  problems and nephrologist told her never to take it again  . Statins Other (See Comments)    Myalgias with rosuvastatin 5mg  daily, lovastatin 20mg  daily, pravastatin 80mg  daily     Physical Exam  Vitals:   03/26/20 1426  BP: (!) 169/80  Pulse: 84  Weight: 138 lb (62.6 kg)  Height: 5\' 6"  (1.676 m)   Body mass index is 22.27 kg/m. No exam data present  General: well developed, well nourished, pleasant elderly Caucasian female, seated, in no evident distress Head: head normocephalic and atraumatic.   Neck: supple with no carotid or supraclavicular bruits Cardiovascular: regular rate and rhythm, no murmurs Musculoskeletal: no deformity Skin:  no rash/petichiae Vascular:  Normal pulses all extremities   Neurologic Exam Mental Status: Awake and fully alert. Normal speech and language. Oriented to place and time. Recent memory impaired and remote memory intact. Attention span, concentration and fund of knowledge mostly appropriate during visit with daughter providing some information. Mood and affect appropriate.  MMSE - Mini Mental State Exam 03/26/2020  Orientation to time 4  Orientation to Place 4  Registration 3  Attention/ Calculation 1  Recall 0  Language- name 2 objects 2  Language- repeat 1  Language- follow 3 step command 3  Language- read & follow direction 1  Write a sentence 1  Copy design 0  Copy design-comments 5 animals  Total score 20   Cranial Nerves: Pupils equal, briskly reactive to light. Extraocular movements full without nystagmus. Visual fields full to confrontation -unable to appreciate any  visual loss or impairment. Hearing intact. Facial sensation intact. Face, tongue, palate moves normally and symmetrically.  Motor: Normal bulk and tone. Normal strength in all tested extremity muscles except slightly decreased left hand dexterity. Sensory.: intact to touch , pinprick , position and vibratory sensation.  Coordination: Rapid alternating movements normal in all extremities except slightly decreased left hand dexterity. Finger-to-nose and heel-to-shin performed accurately bilaterally. Gait and Station: Arises from chair without difficulty. Stance is normal. Gait demonstrates  mild imbalance and slow cautious steps.  Unable to perform tandem walk.  Romberg positive. Reflexes: 1+ and symmetric. Toes downgoing.       ASSESSMENT: Colleen Parks is a 85 y.o. year old female presented with 1 week generalized weakness, poor oral intake and confusion on 11/02/2018 with stroke work-up revealing right MCA scattered infarcts likely artery to artery embolic due to right carotid artery high-grade stenosis s/p right CEA. Vascular risk factors include history of stroke on imaging, history of left CEA 2015, HTN, HLD, and DM.    Residual stroke deficits of mild LUE weakness but overall ongoing improvement.  Baseline dementia stable without worsening of stroke    PLAN:  1. Cognitive impairment with behaviors, worsening -Worsening over the past 2 months.  MRI negative for acute abnormality -MMSE 20/30 -We will obtain lab work completed by PCP as well as recent office visit notes -Question medication related as Prozac dosage increased and was started on lorazepam 2 to 3 months prior.  Discussion regarding use of benzos in elderly and possible side effects.  Would recommend use of Seroquel or Depakote for behaviors related to dementia.  Daughter will further consider and speak to PCP 2. Gait impairment with increased falls -Referral placed to outpatient PT as likely due to prior stroke and  sedentary lifestyle -May benefit from use of assistive device for fall prevention 3. Right MCA stroke:  -Continue aspirin 81 mg daily  and fenofibrate and Repatha for secondary stroke prevention.  -  Continue to follow with PCP for HTN, HLD and DM management.   -Maintain strict control of hypertension with blood pressure goal below 130/90, diabetes with hemoglobin A1c goal below 6.5% and cholesterol with LDL cholesterol (bad cholesterol) goal below 70 mg/dL.  4. Right ICA stenosis s/p CEA:  -Continuation of aspirin and statin.   -Continue to follow with vascular surgery with repeat ultrasound in 9 months per [facility] recommendations (around 08/2020) 5. HTN: Elevated today but monitors at home with stable levels.  Continue to follow with PCP for monitoring and management 6. HLD: Continuation of fenofibrate and Repatha managed by cardiology/PCP 7. DMII: Continue to follow PCP for monitoring and management   Follow up in 3 months or call earlier if needed   I spent 35 minutes of face-to-face and non-face-to-face time with patient and daughter.  This included previsit chart review, lab review, study review, order entry, electronic health record documentation, patient education regarding worsening cognition, gait impairment, history of stroke, carotid stenosis, importance of managing stroke risk factors and answered all questions to patient and daughter satisfaction    Frann Rider, AGNP-BC  [facility] [address] [place], [place] [address]  Phone [phone] Fax [phone] Note: This document was prepared with digital dictation and possible smart phrase technology. Any transcriptional errors that result from this process are unintentional.

## 2020-03-29 ENCOUNTER — Telehealth: Payer: Self-pay | Admitting: Cardiovascular Disease

## 2020-03-29 NOTE — Telephone Encounter (Signed)
Patient's daughter, [name], is calling by request of the drug store to let the office know that they left the Repatha 140mg  pen was left in the car, and that the drug store is sending another one.

## 2020-03-30 NOTE — Telephone Encounter (Signed)
Follow Up  Heather from [facility] is calling to do a verbal to replace repatha pen. Transferred call to [place].

## 2020-05-16 DEATH — deceased

## 2020-07-04 ENCOUNTER — Ambulatory Visit: Payer: Self-pay | Admitting: Adult Health
